# Patient Record
Sex: Male | Born: 1945 | Race: White | Hispanic: No | Marital: Married | Smoking: Former smoker
Health system: Southern US, Community
[De-identification: ages and names within clinical notes are randomized; demographics above are authoritative.]

## PROBLEM LIST (undated history)

## (undated) DIAGNOSIS — E785 Hyperlipidemia, unspecified: Secondary | ICD-10-CM

## (undated) DIAGNOSIS — N3281 Overactive bladder: Secondary | ICD-10-CM

## (undated) DIAGNOSIS — K219 Gastro-esophageal reflux disease without esophagitis: Secondary | ICD-10-CM

## (undated) DIAGNOSIS — Z860100 Personal history of colon polyps, unspecified: Secondary | ICD-10-CM

## (undated) DIAGNOSIS — D72819 Decreased white blood cell count, unspecified: Secondary | ICD-10-CM

## (undated) DIAGNOSIS — C4491 Basal cell carcinoma of skin, unspecified: Secondary | ICD-10-CM

## (undated) DIAGNOSIS — N400 Enlarged prostate without lower urinary tract symptoms: Secondary | ICD-10-CM

## (undated) HISTORY — PX: UPPER GI ENDOSCOPY: SHX6162

## (undated) HISTORY — DX: Personal history of colon polyps, unspecified: Z86.0100

## (undated) HISTORY — DX: Personal history of colonic polyps: Z86.010

## (undated) HISTORY — DX: Basal cell carcinoma of skin, unspecified: C44.91

## (undated) HISTORY — PX: COLONOSCOPY: SHX174

## (undated) HISTORY — DX: Benign prostatic hyperplasia without lower urinary tract symptoms: N40.0

## (undated) HISTORY — DX: Overactive bladder: N32.81

## (undated) HISTORY — DX: Decreased white blood cell count, unspecified: D72.819

## (undated) HISTORY — DX: Gastro-esophageal reflux disease without esophagitis: K21.9

## (undated) HISTORY — PX: WISDOM TOOTH EXTRACTION: SHX21

## (undated) HISTORY — DX: Hyperlipidemia, unspecified: E78.5

---

## 2001-01-23 HISTORY — PX: ROTATOR CUFF REPAIR: SHX139

## 2003-11-26 ENCOUNTER — Ambulatory Visit: Payer: Self-pay | Admitting: Family Medicine

## 2003-12-15 ENCOUNTER — Ambulatory Visit: Payer: Self-pay | Admitting: Family Medicine

## 2004-05-13 ENCOUNTER — Ambulatory Visit: Payer: Self-pay | Admitting: Family Medicine

## 2004-06-24 ENCOUNTER — Ambulatory Visit: Payer: Self-pay | Admitting: Family Medicine

## 2004-08-16 ENCOUNTER — Ambulatory Visit: Payer: Self-pay | Admitting: Gastroenterology

## 2004-09-06 ENCOUNTER — Ambulatory Visit: Payer: Self-pay | Admitting: Gastroenterology

## 2004-11-09 ENCOUNTER — Ambulatory Visit: Payer: Self-pay | Admitting: Family Medicine

## 2005-09-07 ENCOUNTER — Ambulatory Visit: Payer: Self-pay | Admitting: Family Medicine

## 2005-09-13 ENCOUNTER — Ambulatory Visit: Payer: Self-pay | Admitting: Family Medicine

## 2005-09-28 ENCOUNTER — Ambulatory Visit: Payer: Self-pay | Admitting: Family Medicine

## 2005-11-01 ENCOUNTER — Ambulatory Visit: Payer: Self-pay | Admitting: Family Medicine

## 2006-10-22 ENCOUNTER — Encounter: Payer: Self-pay | Admitting: Family Medicine

## 2006-10-22 DIAGNOSIS — N400 Enlarged prostate without lower urinary tract symptoms: Secondary | ICD-10-CM | POA: Insufficient documentation

## 2006-10-23 ENCOUNTER — Ambulatory Visit: Payer: Self-pay | Admitting: Family Medicine

## 2006-10-23 DIAGNOSIS — M545 Low back pain: Secondary | ICD-10-CM

## 2006-11-06 ENCOUNTER — Encounter: Admission: RE | Admit: 2006-11-06 | Discharge: 2006-11-06 | Payer: Self-pay | Admitting: Family Medicine

## 2006-11-16 ENCOUNTER — Ambulatory Visit: Payer: Self-pay | Admitting: Family Medicine

## 2007-03-21 ENCOUNTER — Ambulatory Visit: Payer: Self-pay | Admitting: Family Medicine

## 2007-03-22 LAB — CONVERTED CEMR LAB
ALT: 20 units/L (ref 0–53)
AST: 20 units/L (ref 0–37)
BUN: 14 mg/dL (ref 6–23)
Basophils Relative: 0.4 % (ref 0.0–1.0)
Bilirubin, Direct: 0.1 mg/dL (ref 0.0–0.3)
Calcium: 9.5 mg/dL (ref 8.4–10.5)
Chloride: 105 meq/L (ref 96–112)
Cholesterol: 205 mg/dL (ref 0–200)
Eosinophils Relative: 1.4 % (ref 0.0–5.0)
GFR calc non Af Amer: 80 mL/min
HCT: 43.5 % (ref 39.0–52.0)
Hemoglobin: 14.6 g/dL (ref 13.0–17.0)
Lymphocytes Relative: 33.3 % (ref 12.0–46.0)
MCV: 100.7 fL — ABNORMAL HIGH (ref 78.0–100.0)
Monocytes Relative: 9.1 % (ref 3.0–11.0)
Neutro Abs: 2.4 10*3/uL (ref 1.4–7.7)
Potassium: 4 meq/L (ref 3.5–5.1)
RBC: 4.33 M/uL (ref 4.22–5.81)
Sodium: 140 meq/L (ref 135–145)
Total Bilirubin: 1.1 mg/dL (ref 0.3–1.2)
Total CHOL/HDL Ratio: 3.2
WBC: 4.4 10*3/uL — ABNORMAL LOW (ref 4.5–10.5)

## 2007-08-27 ENCOUNTER — Telehealth: Payer: Self-pay | Admitting: Family Medicine

## 2007-09-09 ENCOUNTER — Ambulatory Visit: Payer: Self-pay | Admitting: Family Medicine

## 2008-02-06 ENCOUNTER — Ambulatory Visit: Payer: Self-pay | Admitting: Family Medicine

## 2008-04-03 ENCOUNTER — Ambulatory Visit: Payer: Self-pay | Admitting: Family Medicine

## 2008-04-03 DIAGNOSIS — R079 Chest pain, unspecified: Secondary | ICD-10-CM

## 2008-04-03 DIAGNOSIS — F4322 Adjustment disorder with anxiety: Secondary | ICD-10-CM

## 2008-04-08 ENCOUNTER — Encounter: Payer: Self-pay | Admitting: Family Medicine

## 2008-04-08 ENCOUNTER — Ambulatory Visit: Payer: Self-pay

## 2008-04-22 ENCOUNTER — Ambulatory Visit: Payer: Self-pay | Admitting: Family Medicine

## 2008-04-22 DIAGNOSIS — R1013 Epigastric pain: Secondary | ICD-10-CM | POA: Insufficient documentation

## 2008-05-22 ENCOUNTER — Telehealth: Payer: Self-pay | Admitting: Family Medicine

## 2008-06-03 ENCOUNTER — Ambulatory Visit: Payer: Self-pay | Admitting: Gastroenterology

## 2008-06-03 ENCOUNTER — Encounter: Payer: Self-pay | Admitting: Gastroenterology

## 2008-10-27 ENCOUNTER — Ambulatory Visit: Payer: Self-pay | Admitting: Family Medicine

## 2008-12-02 ENCOUNTER — Ambulatory Visit: Payer: Self-pay | Admitting: Family Medicine

## 2008-12-02 DIAGNOSIS — E785 Hyperlipidemia, unspecified: Secondary | ICD-10-CM

## 2008-12-02 DIAGNOSIS — K219 Gastro-esophageal reflux disease without esophagitis: Secondary | ICD-10-CM

## 2008-12-04 ENCOUNTER — Encounter: Payer: Self-pay | Admitting: Family Medicine

## 2008-12-04 LAB — CONVERTED CEMR LAB
ALT: 17 units/L (ref 0–53)
AST: 20 units/L (ref 0–37)
Alkaline Phosphatase: 41 units/L (ref 39–117)
CO2: 28 meq/L (ref 19–32)
Chloride: 103 meq/L (ref 96–112)
Eosinophils Relative: 1.1 % (ref 0.0–5.0)
HCT: 39.8 % (ref 39.0–52.0)
HDL: 58 mg/dL (ref >39.00)
Hemoglobin: 13.7 g/dL (ref 13.0–17.0)
Lymphocytes Relative: 30.6 % (ref 12.0–46.0)
Lymphs Abs: 2 10*3/uL (ref 0.7–4.0)
MCHC: 34.5 g/dL (ref 30.0–36.0)
MCV: 102.8 fL — ABNORMAL HIGH (ref 78.0–100.0)
Platelets: 146 10*3/uL — ABNORMAL LOW (ref 150.0–400.0)
Potassium: 4.1 meq/L (ref 3.5–5.1)
RBC: 3.87 M/uL — ABNORMAL LOW (ref 4.22–5.81)
RDW: 11 % — ABNORMAL LOW (ref 11.5–14.6)
Sodium: 140 meq/L (ref 135–145)
Total CHOL/HDL Ratio: 4
Total Protein: 7.3 g/dL (ref 6.0–8.3)

## 2008-12-07 ENCOUNTER — Encounter (INDEPENDENT_AMBULATORY_CARE_PROVIDER_SITE_OTHER): Payer: Self-pay | Admitting: *Deleted

## 2009-02-15 ENCOUNTER — Ambulatory Visit: Payer: Self-pay | Admitting: Family Medicine

## 2009-02-16 LAB — CONVERTED CEMR LAB
Cholesterol: 201 mg/dL — ABNORMAL HIGH (ref 0–200)
Direct LDL: 127.7 mg/dL
HDL: 64.3 mg/dL (ref >39.00)
Triglycerides: 41 mg/dL (ref 0.0–149.0)
VLDL: 8.2 mg/dL (ref 0.0–40.0)

## 2009-02-17 ENCOUNTER — Telehealth: Payer: Self-pay | Admitting: Family Medicine

## 2009-03-15 ENCOUNTER — Ambulatory Visit: Payer: Self-pay | Admitting: Gastroenterology

## 2009-03-22 ENCOUNTER — Telehealth: Payer: Self-pay | Admitting: Family Medicine

## 2009-03-22 ENCOUNTER — Encounter: Payer: Self-pay | Admitting: Family Medicine

## 2009-08-10 ENCOUNTER — Ambulatory Visit: Payer: Self-pay | Admitting: Family Medicine

## 2009-09-21 ENCOUNTER — Encounter (INDEPENDENT_AMBULATORY_CARE_PROVIDER_SITE_OTHER): Payer: Self-pay | Admitting: *Deleted

## 2009-09-21 ENCOUNTER — Ambulatory Visit: Payer: Self-pay | Admitting: Gastroenterology

## 2009-09-21 DIAGNOSIS — Z860100 Personal history of colon polyps, unspecified: Secondary | ICD-10-CM | POA: Insufficient documentation

## 2009-09-21 DIAGNOSIS — R1013 Epigastric pain: Secondary | ICD-10-CM

## 2009-09-21 DIAGNOSIS — K3189 Other diseases of stomach and duodenum: Secondary | ICD-10-CM | POA: Insufficient documentation

## 2009-10-14 ENCOUNTER — Ambulatory Visit: Payer: Self-pay | Admitting: Family Medicine

## 2009-11-03 ENCOUNTER — Ambulatory Visit: Payer: Self-pay | Admitting: Gastroenterology

## 2009-11-05 ENCOUNTER — Encounter: Payer: Self-pay | Admitting: Gastroenterology

## 2010-02-22 NOTE — Assessment & Plan Note (Signed)
Summary: F/U ACID REFLUX/CLE   Vital Signs:  Patient profile:   65 year old male Height:      72.25 inches Weight:      206.50 pounds BMI:     27.91 Temp:     97.8 degrees F oral Pulse rate:   60 / minute Pulse rhythm:   regular BP sitting:   126 / 76  (left arm) Cuff size:   regular  Vitals Entered By: Lewanda Rife LPN (August 10, 2009 9:28 AM) CC: follow-up visit for acid reflux   History of Present Illness: here for f/u of GERD has failed several meds incl omeprazole and nexium and prevacid , and zantac  is now on protonix which we got prior auth for   reflux is not doing well  is still having symptoms - and this is alarming him  last time he saw GI -- suggested he start taking his ppi in the evening (did help a little bit )  follows lifestyle change - does not eat at night or the wrong foods   is belching all night long  no vomiting and only once did he regurgitate  no pain -- almost never (at first he had some chest discomfort)- that has improved  last EGD 5/10   has little stress - but not bad  does exercise and that helps    wt is down 4 lb  good bp  needs refil of alprazolam for travel - uses sometimes for travel   Allergies: 1)  ! * Omeprazole 2)  ! Nexium 3)  ! Prevacid 4)  ! Zantac 5)  Penicillin  Past History:  Past Medical History: Last updated: 12/02/2008 Benign prostatic hypertrophy mild LS deg disk dz and facet hypertrophy mild hyperlipidemia with good HDL  atypical chest pain 06/13/08  GERD  urology  Past Surgical History: Last updated: 06/06/2008 Colonoscopy- polyps (07/2001 ?unknown pathology, SML Colonoscopy- polyps, hemorrhoids, diverticulosis (08/2004, polyps not retrieved, [facility]) MRI LS 10/08- mild deg changes, disk bulging, facet hypertrophy nuclear stress test neg 3/10  5/10 EGD- minor schatzki ring/ gerd  Family History: Last updated: 06/13/08 Father: HTN, died age 28- MI Mother: HTN  Siblings:  GF prostate Ca GM thyroid  ca   Social History: Last updated: 03/15/2009 Marital Status: Married Children: 1 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 CV:  Denies chest pain or discomfort, palpitations, and shortness of breath with exertion. Resp:  Denies cough, shortness of breath, and wheezing. GI:  Complains of abdominal pain, gas, indigestion, and nausea; denies change in bowel habits and vomiting. GU:  Denies urinary frequency. Derm:  Denies poor wound healing and rash. Neuro:  Denies headaches and numbness. Psych:  nl amt of stress for him. Endo:  Denies cold intolerance, excessive thirst, excessive urination, and heat intolerance. Heme:  Denies abnormal bruising and bleeding.  Physical Exam  General:  Well-developed,well-nourished,in no acute distress; alert,appropriate and cooperative throughout examination Head:  normocephalic, atraumatic, and no abnormalities observed.   Eyes:  vision grossly intact, pupils equal, pupils round, and pupils reactive to light.  .con  Mouth:  pharynx pink and moist.   Neck:  supple with full rom and no masses or thyromegally, no JVD or carotid bruit  Lungs:  Normal respiratory effort, chest expands symmetrically. Lungs are clear to auscultation,  no crackles or wheezes. Heart:  Normal rate and regular rhythm. S1 and S2 normal without gallop, murmur, click, rub or other extra sounds. Abdomen:  Bowel sounds positive,abdomen soft and non-tender without masses, organomegaly or hernias noted. no renal bruits  Skin:  Intact without suspicious lesions or rashes no pallor or jaundice Cervical Nodes:  No lymphadenopathy noted Inguinal Nodes:  No significant adenopathy Psych:  normal affect, talkative and pleasant    Impression & Recommendations:  Problem # 1:   GERD (ICD-530.81) Assessment Deteriorated  worsening belching - improved but not symptom free with protonix after failing several ppis and H2s ref to GI for further eval disc quitting coffee as well His updated medication list for this problem includes:    Protonix 40 Mg Tbec (Pantoprazole sodium) .Marland Kitchen... 1 by mouth once daily in pm  Orders: Gastroenterology Referral (GI) Prescription Created Electronically [phone])  Diagnostics Reviewed:  EGD: Location: [facility]   (06/03/2008) Discussed lifestyle modifications, diet, antacids/medications, and preventive measures. Handout provided.   Problem # 2:  CHEST PAIN (ICD-786.50) Assessment: Improved this is resolved on PPI completely- adv to update if this re- occurs  Complete Medication List: 1)  Flomax 0.4 Mg Cp24 (Tamsulosin hcl) .... One by mouth qd 2)  Avodart 0.5 Mg Caps (Dutasteride) .... One by mouth qd 3)  Fish Oil 1200 Mg Caps (Omega-3 fatty acids) .... One by mouth bid 4)  Alprazolam 0.5 Mg Tabs (Alprazolam) .Marland Kitchen.. 1 by mouth up to two times a day as needed airplane flight 5)  Multivitamins Tabs (Multiple vitamin) .... Take 1 tablet by mouth once a day 6)  Glucosamine Chondr 500 Complex Caps (Glucosamine-chondroit-vit c-mn) .Marland Kitchen.. 1 cap by mouth two times a day 7)  Protonix 40 Mg Tbec (Pantoprazole sodium) .Marland Kitchen.. 1 by mouth once daily in pm  Patient Instructions: 1)  we will do referral to GI at check out  2)  continue the protonix for now  3)  continue good diet and exercise (avoid carbonation) 4)  please update me in meantime if symptoms worsen Prescriptions: PROTONIX 40 MG TBEC (PANTOPRAZOLE SODIUM) 1 by mouth once daily in pm  #30 x 5   Entered and Authorized by:   Judith Part MD   Signed by:   Judith Part MD on 08/10/2009   Method used:   Electronically to        [facility]. [address] [phone]* (retail)       [address] [place], [place]  [id number]       Ph: [phone]       Fax:  [phone]   RxID:   [id number] ALPRAZOLAM 0.5 MG TABS (ALPRAZOLAM) 1 by mouth up to two times a day as needed airplane flight  #15 x 0   Entered and Authorized by:   Judith Part MD   Signed by:   Judith Part MD on 08/10/2009   Method used:   Print then Give to Patient   RxID:   [id number]   Current Allergies (reviewed today): ! * OMEPRAZOLE ! NEXIUM ! PREVACID ! ZANTAC PENICILLIN

## 2010-02-22 NOTE — Letter (Signed)
Summary: [facility] Instructions  [facility]  [address] [place], [place] [address]   Phone: [phone]  Fax: [phone]       Darren [name]    01-09-1946    MRN: [id number]        Procedure Day /Date:11/03/09 WED     Arrival Time:10 am     Procedure Time:11 am     Location of Procedure:                    X  [facility] (4th Floor)                        PREPARATION FOR COLONOSCOPY WITH MOVIPREP   Starting 5 days prior to your procedure 10/29/09 do not eat nuts, seeds, popcorn, corn, beans, peas,  salads, or any raw vegetables.  Do not take any fiber supplements (e.g. Metamucil, Citrucel, and Benefiber).  THE DAY BEFORE YOUR PROCEDURE         DATE:11/02/09 DAY: TUE  1.  Drink clear liquids the entire day-NO SOLID FOOD  2.  Do not drink anything colored red or purple.  Avoid juices with pulp.  No orange juice.  3.  Drink at least 64 oz. (8 glasses) of fluid/clear liquids during the day to prevent dehydration and help the prep work efficiently.  CLEAR LIQUIDS INCLUDE: Water Jello Ice Popsicles Tea (sugar ok, no milk/cream) Powdered fruit flavored drinks Coffee (sugar ok, no milk/cream) Gatorade Juice: apple, white grape, white cranberry  Lemonade Clear bullion, consomm, broth Carbonated beverages (any kind) Strained chicken noodle soup Hard Candy                             4.  In the morning, mix first dose of MoviPrep solution:    Empty 1 Pouch A and 1 Pouch B into the disposable container    Add lukewarm drinking water to the top line of the container. Mix to dissolve    Refrigerate (mixed solution should be used within 24 hrs)  5.  Begin drinking the prep at 5:00 p.m. The MoviPrep container is divided by 4 marks.   Every 15 minutes drink the solution down to the next mark (approximately 8 oz) until the full liter is complete.   6.  Follow completed prep with 16 oz of clear liquid of your choice (Nothing red or purple).   Continue to drink clear liquids until bedtime.  7.  Before going to bed, mix second dose of MoviPrep solution:    Empty 1 Pouch A and 1 Pouch B into the disposable container    Add lukewarm drinking water to the top line of the container. Mix to dissolve    Refrigerate  THE DAY OF YOUR PROCEDURE      DATE: 11/03/09  DAY: WED  Beginning at 6 am (5 hours before procedure):         1. Every 15 minutes, drink the solution down to the next mark (approx 8 oz) until the full liter is complete.  2. Follow completed prep with 16 oz. of clear liquid of your choice.    3. You may drink clear liquids until 9 am (2 HOURS BEFORE PROCEDURE).   MEDICATION INSTRUCTIONS  Unless otherwise instructed, you should take regular prescription medications with a small sip of water   as early as possible the morning of your procedure.  OTHER INSTRUCTIONS  You will need a responsible adult at least 65 years of age to accompany you and drive you home.   This person must remain in the waiting room during your procedure.  Wear loose fitting clothing that is easily removed.  Leave jewelry and other valuables at home.  However, you may wish to bring a book to read or  an iPod/MP3 player to listen to music as you wait for your procedure to start.  Remove all body piercing jewelry and leave at home.  Total time from sign-in until discharge is approximately 2-3 hours.  You should go home directly after your procedure and rest.  You can resume normal activities the  day after your procedure.  The day of your procedure you should not:   Drive   Make legal decisions   Operate machinery   Drink alcohol   Return to work  You will receive specific instructions about eating, activities and medications before you leave.    The above instructions have been reviewed and explained to me by   _______________________    I fully understand and can verbalize these instructions  _____________________________ Date _________

## 2010-02-22 NOTE — Assessment & Plan Note (Signed)
Review of gastrointestinal problems: 1.  GERD 2.  nonobstructing Schatzki's ring, EGD 2010 not dilated due to lack of dysphasia    History of Present Illness Visit Type: Follow-up Visit Primary GI MD: Rob Bunting MD Primary Provider: Roxy Manns, MD Requesting Provider: n/a Chief Complaint: acid reflux History of Present Illness:     very pleasant 65 year old man whom I last saw 05/2008, EGD performed see above.  I felt he had nonerosive GERD and recommended that he stay on daily PPI.  He has gone through several different PPIs, still is bothered by belching throughout the day and also getting up at night to urinate he has belches.  The numb pains that were going on previous are gone.  drinks caffeine, alcohol daily.  He will take PPI in AM, 1/2 hour before eating.  He is most bothered, concerned by dyspeptic symptoms in evening.  he has absolutely no dysphasia. His weight has been stable. He has no overt GI bleeding.              Current Medications (verified): 1)  Flomax 0.4 Mg  Cp24 (Tamsulosin Hcl) .... One By Mouth Qd 2)  Avodart 0.5 Mg  Caps (Dutasteride) .... One By Mouth Qd 3)  Fish Oil 1200 Mg  Caps (Omega-3 Fatty Acids) .... One By Mouth Bid 4)  Alprazolam 0.5 Mg Tabs (Alprazolam) .Marland Kitchen.. 1 By Mouth Up To Two Times A Day As Needed Airplane Flight 5)  Multivitamins   Tabs (Multiple Vitamin) .... Take 1 Tablet By Mouth Once A Day 6)  Glucosamine Chondr 500 Complex  Caps (Glucosamine-Chondroit-Vit C-Mn) .Marland Kitchen.. 1 Cap By Mouth Two Times A Day 7)  Protonix 40 Mg Tbec (Pantoprazole Sodium) .Marland Kitchen.. 1 By Mouth Once Daily in Am  Allergies (verified): 1)  ! * Omeprazole 2)  ! 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, drinks 2-3 coffees a day  Vital Signs:  Patient profile:   65 year old male Height:      72.25 inches Weight:      210 pounds BMI:      28.39 BSA:     2.18 Pulse rate:   64 / minute Pulse rhythm:   regular BP sitting:   120 / 76  (left arm) Cuff size:   regular  Vitals Entered By: Ok Anis CMA (March 15, 2009 1:46 PM)  Physical Exam  Additional Exam:  Constitutional: generally well appearing Psychiatric: alert and oriented times 3 Abdomen: soft, non-tender, non-distended, normal bowel sounds    Impression & Recommendations:  Problem # 1:  GERD, GERD related dyspepsia I think some of his belching symptoms may indeed be GERD related. He drinks 4-6 caffeinated or alcoholic beverages a day and I recommended he try to cut back. He has no esophagitis or Barrett's mucosa noted on his EGD last year and so I think we are really simply talking about symptomatic control here. I also recommended he change the timing of his proton pump inhibitor so that he takes it prior to his dinner meal. He will call if he has any further questions or concerns.  Patient Instructions: 1)  You should change the way you are taking your antiacid medicine (protonix) so that you are taking it 20-30 minutes prior to a decent dinner meal as that is the way the pill is designed to work most effectively. 2)  Cutting back on caffeine, alcohol a  bit may help GERD symptoms. 3)  Call Dr. Christella Hartigan as needed. 4)  A copy of this information will be sent to Dr. Milinda Antis. 5)  The medication list was reviewed and reconciled.  All changed / newly prescribed medications were explained.  A complete medication list was provided to the patient / caregiver.

## 2010-02-22 NOTE — Letter (Signed)
Summary: Results Letter  [facility]  [address] [place], [place] [address]   Phone: [phone]  Fax: [phone]        November 05, 2009 MRN: [id number]    Darren [name] [address] [place], [place]  [address]    Dear Mr. GRANIER,   One of the polyps removed during your recent procedure was proven to be adenomatous.  These are pre-cancerous polyps that may have grown into cancers if they had not been removed.  Based on current nationally recognized surveillance guidelines, I recommend that you have a repeat colonoscopy in 5 years.  We will therefore put your information in our reminder system and will contact you in 5 years to schedule a repeat procedure.  Please call if you have any questions or concerns.       Sincerely,  Rachael Fee MD  This letter has been electronically signed by your physician.  Appended Document: Results Letter letter mailed

## 2010-02-22 NOTE — Assessment & Plan Note (Signed)
Review of gastrointestinal problems: 1.  GERD (nonerosive) 2.  nonobstructing Schatzki's ring, EGD 2010 not dilated due to lack of dysphasia 3.  colon polyps removed in 2003 and in 2006 by Victorino Dike, not retrieved (unclear pathology).  Was told to have next colonoscopy in 2012   History of Present Illness Visit Type: Follow-up Visit Primary GI MD: Rob Bunting MD Primary Amyiah Gaba: Roxy Manns, MD Requesting Harlen Danford: n/a Chief Complaint: Acid Reflux History of Present Illness:     very pleasant 65 year old man whom I last saw February 2011.  He stillbelches at night whenever he is up.  he doesn't have pain or signficant discomfort.    he was under the assumption that every dyspeptic symptoms or GERD related symptom he had put him at higher risk for  every once in a while he will drink some bicarb or vinegar...he takes it for a disccomfort, fullness in esophagus.  Doesn't not have true burning.  VERY rarely acid taste in mouch.  Rarely belches during the day.  always belches at night.  he eats dinner 3-4 hours after finishing his dinner meal.  has lost 3 pounds in 7 months.  takes protonix 1/2 hour before dinner meal           Current Medications (verified): 1)  Flomax 0.4 Mg  Cp24 (Tamsulosin Hcl) .... One By Mouth Qd 2)  Avodart 0.5 Mg  Caps (Dutasteride) .... One By Mouth Qd 3)  Fish Oil 1200 Mg  Caps (Omega-3 Fatty Acids) .... One By Mouth Bid 4)  Alprazolam 0.5 Mg Tabs (Alprazolam) .Marland Kitchen.. 1 By Mouth Up To Two Times A Day As Needed Airplane Flight 5)  Multivitamins   Tabs (Multiple Vitamin) .... Take 1 Tablet By Mouth Once A Day 6)  Glucosamine Chondr 500 Complex  Caps (Glucosamine-Chondroit-Vit C-Mn) .Marland Kitchen.. 1 Cap By Mouth Two Times A Day 7)  Protonix 40 Mg Tbec (Pantoprazole Sodium) .Marland Kitchen.. 1 By Mouth Once Daily in Pm  Allergies (verified): 1)  ! * Omeprazole 2)  ! Nexium 3)  ! Prevacid 4)  ! Zantac 5)  Penicillin  Vital Signs:  Patient profile:   65 year old  male Height:      72.25 inches Weight:      207 pounds BMI:     27.98 BSA:     2.17 Pulse rate:   68 / minute Pulse rhythm:   regular BP sitting:   110 / 64  (left arm)  Vitals Entered By: Merri Ray CMA Duncan Dull) (September 21, 2009 3:53 PM)  Physical Exam  Additional Exam:  Constitutional: generally well appearing Psychiatric: alert and oriented times 3 Abdomen: soft, non-tender, non-distended, normal bowel sounds    Impression & Recommendations:  Problem # 1:  dyspepsia, question GERD relation he was under the assumption that every dyspeptic or GERD like symptom that he had would put him at risk for esophageal cancer and that achieving zero symptoms was the goal of therapy. I explained to him that this was not truly the case and that given his endoscopy last year and his lack of any alarm symptoms I think it is highly unlikely that he has anything neoplastic going on in his esophagus. I don't see a need to repeat endoscopy at this point. He in fact is not very bothered by his symptoms except for the worry that they have caused. I recommended he try Gas-X at night for his nightly belching. I've also asked him to try to cut back on his proton  pump inhibitor to see how his symptoms react.  Problem # 2:  colon polyps he has had 2 polyps removed however not retrieved by his previous gastroenterologist. I recommended we repeat his colonoscopy around now, this would be 5 years from his most recent one, and based on the findings on this next colonoscopy I will recommend proper interval based on national polyp surveillance guidelines.  Patient Instructions: 1)  Cut back protonix to every other day.   2)  If symptoms worsen, then go back to once daily protonix. 3)  Consider one gas-ex nightly at bedtime. 4)  You will be scheduled to have a colonoscopy. 5)  The medication list was reviewed and reconciled.  All changed / newly prescribed medications were explained.  A complete medication list  was provided to the patient / caregiver.  Appended Document: Orders Update/movi    Clinical Lists Changes  Problems: Added new problem of PERSONAL HISTORY OF COLONIC POLYPS (ICD-V12.72) Added new problem of DYSPEPSIA&OTHER SPEC DISORDERS FUNCTION STOMACH (ICD-536.8) Medications: Added new medication of MOVIPREP 100 GM  SOLR (PEG-KCL-NACL-NASULF-NA ASC-C) As per prep instructions. - Signed Rx of MOVIPREP 100 GM  SOLR (PEG-KCL-NACL-NASULF-NA ASC-C) As per prep instructions.;  #1 x 0;  Signed;  Entered by: Chales Abrahams CMA (AAMA);  Authorized by: Rachael Fee MD;  Method used: Electronically to [facility]. [facility] [phone]*, [address] [place], [place]  [id number], Ph: [phone], Fax: [phone] Orders: Added new Test order of Colonoscopy (Colon) - Signed    Prescriptions: MOVIPREP 100 GM  SOLR (PEG-KCL-NACL-NASULF-NA ASC-C) As per prep instructions.  #1 x 0   Entered by:   Chales Abrahams CMA (AAMA)   Authorized by:   Rachael Fee MD   Signed by:   Chales Abrahams CMA (AAMA) on 09/21/2009   Method used:   Electronically to        [facility]. [address] [phone]* (retail)       [address] [place], [place]  [id number]       Ph: [phone]       Fax: [phone]   RxID:   [id number]

## 2010-02-22 NOTE — Procedures (Signed)
Summary: Colonoscopy  Patient: Darren [name] Note: All result statuses are Final unless otherwise noted.  Tests: (1) Colonoscopy (COL)   COL Colonoscopy           DONE     [facility]     [address]     [place], [place]  [address]           COLONOSCOPY PROCEDURE REPORT     PATIENT:  [name], Darren  MR#:  [id number]     BIRTHDATE:  23-Oct-1945, 64 yrs. old  GENDER:  male     ENDOSCOPIST:  Rachael Fee, MD     PROCEDURE DATE:  11/03/2009     PROCEDURE:  Colonoscopy with biopsy and snare polypectomy     ASA CLASS:  Class II     INDICATIONS:  previous small polyps removed by Dr. Corinda Gubler, were     not sent to pathology     MEDICATIONS:   Fentanyl 100 mcg IV, Versed 10 mg IV     DESCRIPTION OF PROCEDURE:   After the risks benefits and     alternatives of the procedure were thoroughly explained, informed     consent was obtained.  Digital rectal exam was performed and     revealed no rectal masses.   The LB [id number] endoscope     was introduced through the anus and advanced to the cecum, which     was identified by both the appendix and ileocecal valve, without     limitations.  The quality of the prep was good, using MoviPrep.     The instrument was then slowly withdrawn as the colon was fully     examined.     <<PROCEDUREIMAGES>>     FINDINGS: Two small sessile polyps were found. Both were removed     and both were sent to pathology. These ranged in size from 2mm to     4mm. One was removed with forceps and one was removed with cold     snare. They were located in ascending and descending colon     segments (see image3, image4, and image5).  This was otherwise a     normal examination of the colon (see image1, image2, and image6).     Retroflexed views in the rectum revealed no abnormalities.    The     scope was then withdrawn from the patient and the procedure     completed.     COMPLICATIONS:  None           ENDOSCOPIC IMPRESSION:     1) Two  small polyps, both were removed and both were sent to     pathology     2) Otherwise normal examination           RECOMMENDATIONS:     1) If the polyp(s) removed today are proven to be adenomatous     (pre-cancerous) polyps, you will need a repeat colonoscopy in 5     years. Otherwise you should continue to follow colorectal cancer     screening guidelines for "routine risk" patients with colonoscopy     in 10 years.     2) You will receive a letter within 1-2 weeks with the results     of your biopsy as well as final recommendations. Please call my     office if you have not received a letter after 3 weeks.           ______________________________  Rachael Fee, MD           cc: Roxy Manns, MD           n.     Rosalie Doctor:   Rachael Fee at 11/03/2009 10:55 AM           Rema Fendt, [id number]  Note: An exclamation mark (!) indicates a result that was not dispersed into the flowsheet. Document Creation Date: 11/03/2009 10:55 AM _______________________________________________________________________  (1) Order result status: Final Collection or observation date-time: 11/03/2009 10:50 Requested date-time:  Receipt date-time:  Reported date-time:  Referring Physician:   Ordering Physician: Rob Bunting [phone]) Specimen Source:  Source: Launa Grill Order Number: [id number] Lab site:   Appended Document: Colonoscopy     Procedures Next Due Date:    Colonoscopy: 10/2014

## 2010-02-22 NOTE — Medication Information (Signed)
Summary: Approval for Additional Quantity Pantoprazole/[employer]  Approval for Additional Quantity Pantoprazole/[employer]   Imported By: Lanelle Bal 03/25/2009 11:20:28  _____________________________________________________________________  External Attachment:    Type:   Image     Comment:   External Document

## 2010-02-22 NOTE — Miscellaneous (Signed)
Summary: Controlled Substance Agreement  Controlled Substance Agreement   Imported By: Lanelle Bal 08/17/2009 12:13:23  _____________________________________________________________________  External Attachment:    Type:   Image     Comment:   External Document

## 2010-02-22 NOTE — Progress Notes (Signed)
Summary: Different Rx for Acid reflux  Phone Note Call from Patient   Caller: Patient Call For: Judith Part MD Summary of Call: Patient called and stated that Dr. Milinda Antis has been trying several different medications for his acid reflux.  He says its time to try something different now.  Currently taking Nexium.  Has tried Nexium, Prevacid, and Omperazole and would like to try something else.  Initial call taken by: Linde Gillis CMA Duncan Dull),  February 17, 2009 4:54 PM  Follow-up for Phone Call        lets try protonix px written on EMR for call in  let me know if not effective or side eff  Follow-up by: Judith Part MD,  February 17, 2009 8:01 PM  Additional Follow-up for Phone Call Additional follow up Details #1::        Advised pt. Med called to [facility] [address] Additional Follow-up by: Lowella Petties CMA,  February 18, 2009 9:58 AM    New/Updated Medications: PROTONIX 40 MG TBEC (PANTOPRAZOLE SODIUM) 1 by mouth once daily in am Prescriptions: PROTONIX 40 MG TBEC (PANTOPRAZOLE SODIUM) 1 by mouth once daily in am  #30 x 5   Entered and Authorized by:   Judith Part MD   Signed by:   Judith Part MD on 02/17/2009   Method used:   Telephoned to ...       [facility] [address] [phone]* (retail)       [address] [place], [place]  [id number]       Ph: [phone]       Fax: [phone]   RxID:   [id number]

## 2010-02-22 NOTE — Assessment & Plan Note (Signed)
Summary: FLU SHOT / LFW  Nurse Visit   Allergies: 1)  ! * Omeprazole 2)  ! Nexium 3)  ! Prevacid 4)  ! Zantac 5)  Penicillin  Immunizations Administered:  Influenza Vaccine # 1:    Vaccine Type: Fluvax 3+    Site: left deltoid    Mfr: GlaxoSmithKline    Dose: 0.5 ml    Route: IM    Given by: Mervin Hack CMA (AAMA)    Exp. Date: 07/23/2010    Lot #: [id number]    VIS given: 08/17/09 version given October 14, 2009.  Flu Vaccine Consent Questions:    Do you have a history of severe allergic reactions to this vaccine? no    Any prior history of allergic reactions to egg and/or gelatin? no    Do you have a sensitivity to the preservative Thimersol? no    Do you have a past history of Guillan-Barre Syndrome? no    Do you currently have an acute febrile illness? no    Have you ever had a severe reaction to latex? no    Vaccine information given and explained to patient? yes  Orders Added: 1)  Flu Vaccine 1yrs + [90658] 2)  Admin 1st Vaccine [09811]

## 2010-02-22 NOTE — Progress Notes (Signed)
Summary: Prior Authorization for Pantoprazole 40mg   Phone Note From Pharmacy Call back at ph [phone] fax [phone]   Caller: [facility] [address] [phone]* Call For: Dr. Milinda Antis  Summary of Call: Received fax form from  pharmacy stating that prior authorization is needed for Pantoprazole 40mg .  Called [employer] at [phone] and requested PA forms.  Case # [id number].  Linde Gillis CMA Duncan Dull)  March 22, 2009 10:37 AM   Received PA forms, in your IN box. Initial call taken by: Linde Gillis CMA Duncan Dull),  March 22, 2009 11:44 AM  Follow-up for Phone Call        thanks form done and in nurse in box  Follow-up by: Judith Part MD,  March 22, 2009 1:17 PM  Additional Follow-up for Phone Call Additional follow up Details #1::        Completed form faxed to [phone]  as instructed. Original form is given to [facility] in case needed at later time.Lewanda Rife LPN  March 22, 2009 4:16 PM      Appended Document: Prior Authorization for Pantoprazole 40mg  Received PA Approval for Pantoprazole Sodium.  Approved from 03/01/2009 through 03/22/2010.  Patient and pharmacy notified.

## 2010-02-22 NOTE — Progress Notes (Signed)
Summary: refill on medication   Phone Note Call from Patient Call back at Home Phone [phone]   Caller: Patient Call For: Judith Part MD Summary of Call: Patient called and said that he could not get a refill on his medication without prior auth. He said that before you had discussed changing some of his medication. He did not get pharmacy to send [place] the prior auth yet because he wants to know if you want to change anything. [facility] [address]  Initial call taken by: Melody Comas,  March 22, 2009 10:27 AM  Follow-up for Phone Call        if protonix works for him- it is my next choice so I will go ahead and do the prior auth  let me know if any problems with it  form done and in nurse in box  Follow-up by: Judith Part MD,  March 22, 2009 1:14 PM  Additional Follow-up for Phone Call Additional follow up Details #1::        Completed form faxed to [phone] as instructed. Lowella Bandy has original form if needed later and Patient notified as instructed by telephone. Lewanda Rife LPN  March 22, 2009 5:19 PM

## 2010-03-10 ENCOUNTER — Telehealth: Payer: Self-pay | Admitting: Family Medicine

## 2010-03-16 NOTE — Progress Notes (Signed)
Summary: new vaccine  Phone Note Call from Patient Call back at Home Phone [phone]   Caller: Patient Call For: Judith Part MD Summary of Call: Patient calling said heard that a new pneumonia vaccine is out that protects against a type of pnuemonia that the pnumovax doesnt protect against if this is true patient would like to get this vaccine.Consuello Masse CMA   Initial call taken by: Benny Lennert CMA Duncan Dull),  March 10, 2010 2:08 PM  Follow-up for Phone Call        I don't know what that would be --- please have him try to find out the name of it  Follow-up by: Judith Part MD,  March 10, 2010 3:14 PM  Additional Follow-up for Phone Call Additional follow up Details #1::        Patient's wife notified as instructed by telephone. Lewanda Rife LPN  March 10, 2010 4:59 PM

## 2010-10-26 ENCOUNTER — Ambulatory Visit (INDEPENDENT_AMBULATORY_CARE_PROVIDER_SITE_OTHER): Payer: Medicare Other

## 2010-10-26 DIAGNOSIS — Z23 Encounter for immunization: Secondary | ICD-10-CM

## 2011-05-23 ENCOUNTER — Telehealth: Payer: Self-pay | Admitting: Family Medicine

## 2011-05-23 NOTE — Telephone Encounter (Signed)
Pt is coming in next week for cpe labs and wanted to know if he could add Cholesterol particle size test to those labs ??

## 2011-05-23 NOTE — Telephone Encounter (Signed)
Patient advised as instructed via telephone, he stated that he will call his insurance company to see if they will pay for the test but if they will not pay for the test he will pay for it out of pocket.

## 2011-05-23 NOTE — Telephone Encounter (Signed)
That is called a lipo science profile - and I am fine with it - but need pt to call insurance about coverage of that first -- then let me know and I will add it on  Thanks

## 2011-05-29 ENCOUNTER — Telehealth: Payer: Self-pay | Admitting: Family Medicine

## 2011-05-29 DIAGNOSIS — E785 Hyperlipidemia, unspecified: Secondary | ICD-10-CM

## 2011-05-29 DIAGNOSIS — K219 Gastro-esophageal reflux disease without esophagitis: Secondary | ICD-10-CM

## 2011-05-29 DIAGNOSIS — Z0000 Encounter for general adult medical examination without abnormal findings: Secondary | ICD-10-CM | POA: Insufficient documentation

## 2011-05-29 DIAGNOSIS — Z86010 Personal history of colonic polyps: Secondary | ICD-10-CM

## 2011-05-29 DIAGNOSIS — N400 Enlarged prostate without lower urinary tract symptoms: Secondary | ICD-10-CM

## 2011-05-29 NOTE — Telephone Encounter (Signed)
Message copied by Judy Pimple on Mon May 29, 2011  1:43 PM ------      Message from: Alvina Chou      Created: Thu May 25, 2011 10:48 AM      Regarding: Labs for May 7       Patient is scheduled for CPX labs, please order future labs, Thanks , Camelia Eng

## 2011-05-30 ENCOUNTER — Other Ambulatory Visit (INDEPENDENT_AMBULATORY_CARE_PROVIDER_SITE_OTHER): Payer: Medicare Other

## 2011-05-30 DIAGNOSIS — E7800 Pure hypercholesterolemia, unspecified: Secondary | ICD-10-CM

## 2011-05-30 DIAGNOSIS — Z860100 Personal history of colon polyps, unspecified: Secondary | ICD-10-CM

## 2011-05-30 DIAGNOSIS — N400 Enlarged prostate without lower urinary tract symptoms: Secondary | ICD-10-CM

## 2011-05-30 DIAGNOSIS — K219 Gastro-esophageal reflux disease without esophagitis: Secondary | ICD-10-CM

## 2011-05-30 DIAGNOSIS — E785 Hyperlipidemia, unspecified: Secondary | ICD-10-CM

## 2011-05-30 DIAGNOSIS — E039 Hypothyroidism, unspecified: Secondary | ICD-10-CM

## 2011-05-30 DIAGNOSIS — Z0000 Encounter for general adult medical examination without abnormal findings: Secondary | ICD-10-CM

## 2011-05-30 LAB — CBC WITH DIFFERENTIAL/PLATELET
Basophils Absolute: 0 10*3/uL (ref 0.0–0.1)
HCT: 42.3 % (ref 39.0–52.0)
Hemoglobin: 14.2 g/dL (ref 13.0–17.0)
Lymphocytes Relative: 35.9 % (ref 12.0–46.0)
Lymphs Abs: 1.2 10*3/uL (ref 0.7–4.0)
MCHC: 33.5 g/dL (ref 30.0–36.0)
Neutrophils Relative %: 44.9 % (ref 43.0–77.0)
Platelets: 130 10*3/uL — ABNORMAL LOW (ref 150.0–400.0)
RBC: 4.25 Mil/uL (ref 4.22–5.81)
RDW: 12.7 % (ref 11.5–14.6)
WBC: 3.3 10*3/uL — ABNORMAL LOW (ref 4.5–10.5)

## 2011-05-30 LAB — COMPREHENSIVE METABOLIC PANEL
BUN: 15 mg/dL (ref 6–23)
CO2: 27 mEq/L (ref 19–32)
Calcium: 9.2 mg/dL (ref 8.4–10.5)
Chloride: 107 mEq/L (ref 96–112)
GFR: 97.08 mL/min (ref >60.00)
Glucose, Bld: 97 mg/dL (ref 70–99)

## 2011-05-30 LAB — TSH: TSH: 2.07 u[IU]/mL (ref 0.35–5.50)

## 2011-05-30 LAB — PSA: PSA: 0.25 ng/mL (ref 0.10–4.00)

## 2011-06-01 LAB — NMR LIPOPROFILE WITH LIPIDS
HDL Particle Number: 34.4 umol/L (ref >=30.5)
LDL (calc): 103 mg/dL — ABNORMAL HIGH (ref <100)
LP-IR Score: 20 (ref <=45)
Small LDL Particle Number: 485 nmol/L (ref <=527)
Triglycerides: 67 mg/dL (ref <150)

## 2011-06-12 ENCOUNTER — Encounter: Payer: Self-pay | Admitting: Family Medicine

## 2011-06-12 ENCOUNTER — Ambulatory Visit (INDEPENDENT_AMBULATORY_CARE_PROVIDER_SITE_OTHER): Payer: Medicare Other | Admitting: Family Medicine

## 2011-06-12 ENCOUNTER — Other Ambulatory Visit: Payer: Self-pay | Admitting: *Deleted

## 2011-06-12 VITALS — BP 118/72 | HR 69 | Temp 98.7°F | Ht 72.5 in | Wt 220.8 lb

## 2011-06-12 DIAGNOSIS — N400 Enlarged prostate without lower urinary tract symptoms: Secondary | ICD-10-CM

## 2011-06-12 DIAGNOSIS — Z86010 Personal history of colonic polyps: Secondary | ICD-10-CM

## 2011-06-12 DIAGNOSIS — D72819 Decreased white blood cell count, unspecified: Secondary | ICD-10-CM | POA: Insufficient documentation

## 2011-06-12 DIAGNOSIS — E785 Hyperlipidemia, unspecified: Secondary | ICD-10-CM

## 2011-06-12 MED ORDER — ALPRAZOLAM 0.5 MG PO TABS: 0.5000 mg | ORAL_TABLET | Freq: Two times a day (BID) | ORAL | Status: AC | PRN

## 2011-06-12 NOTE — Assessment & Plan Note (Signed)
utd colonoscopy with 5 year recal- not due yet

## 2011-06-12 NOTE — Assessment & Plan Note (Signed)
Very mild with mildly low platelet Suspect from many colds this season -one recent Re check mid summer

## 2011-06-12 NOTE — Progress Notes (Signed)
Subjective:    Patient ID: Darren [name], male    DOB: 12-18-1945, 66 y.o.   MRN: [id number]  HPI Here for check up of chronic medical conditions and to review health mt list  Is doing well overall  Feels ok     bp good 118/76  Wt is up 13 lb with bmi of 29 Likes to stay below 215 -- ? Reason for the gain , poss age related and also less outdoor work / too much eating  Still goes to the gym   Wbc 3.3 and platelet slt low - will watch this  Has had several viruses - with grandkids around  Feels fine now   .lipids- did lipo science profile Lab Results  Component Value Date   CHOL 201* 02/15/2009   HDL 64.30 02/15/2009   LDLCALC 103* 05/30/2011   LDLDIRECT 127.7 02/15/2009   TRIG 67 05/30/2011   CHOLHDL 3 02/15/2009   he had nl amt of small particles - re assuring  Is eating very low cholesterol as well   Other labs all good   utd imms  psa  Was 0.25 Hx of BPH- does still have a lot of nocturia -- has contemplated surgery - may consider it  Gets urol prostate exams   colonosc 10/11 with polyps- 5 year recal  No longer has any acid reflux issues- happy about that   Patient Active Problem List  Diagnoses  . PURE HYPERCHOLESTEROLEMIA  . HYPERLIPIDEMIA, MILD  . ANXIETY DISORDER, SITUATIONAL, MILD  . GERD  . DYSPEPSIA&OTHER SPEC DISORDERS FUNCTION STOMACH  . BENIGN PROSTATIC HYPERTROPHY  . BACK PAIN, LUMBAR  . CHEST PAIN  . EPIGASTRIC PAIN  . PERSONAL HISTORY OF COLONIC POLYPS  . Routine general medical examination at a health care facility   No past medical history on file. No past surgical history on file. History  Substance Use Topics  . Smoking status: Never Smoker   . Smokeless tobacco: Not on file  . Alcohol Use: Not on file   No family history on file. Allergies  Allergen Reactions  . Esomeprazole Magnesium     REACTION: lower GI side effects  . Lansoprazole     REACTION: not effective  . Omeprazole     REACTION: is not symptom free with that  .  Penicillins     REACTION: allergy as a child  . Ranitidine Hcl     REACTION: not effective   No current outpatient prescriptions on file prior to visit.       Review of Systems    Review of Systems  Constitutional: Negative for fever, appetite change, fatigue and unexpected weight change.  Eyes: Negative for pain and visual disturbance.  Respiratory: Negative for cough and shortness of breath.   Cardiovascular: Negative for cp or palpitations    Gastrointestinal: Negative for nausea, diarrhea and constipation.  Genitourinary: Negative for urgency and frequency.  Skin: Negative for pallor or rash   Neurological: Negative for weakness, light-headedness, numbness and headaches.  Hematological: Negative for adenopathy. Does not bruise/bleed easily.  Psychiatric/Behavioral: Negative for dysphoric mood. The patient is not nervous/anxious.      Objective:   Physical Exam  Constitutional: He appears well-developed and well-nourished. No distress.       overwt and well appearing   HENT:  Head: Normocephalic and atraumatic.  Right Ear: External ear normal.  Left Ear: External ear normal.  Nose: Nose normal.  Mouth/Throat: Oropharynx is clear and moist.  Pt talks very loudly  Denies hearing problems   Eyes: Conjunctivae and EOM are normal. Pupils are equal, round, and reactive to light. No scleral icterus.  Neck: Normal range of motion. Neck supple. No JVD present. Carotid bruit is not present. No thyromegaly present.  Cardiovascular: Normal rate, regular rhythm, normal heart sounds and intact distal pulses.  Exam reveals no gallop.   Pulmonary/Chest: Effort normal and breath sounds normal. No respiratory distress. He has no wheezes.  Abdominal: Soft. Bowel sounds are normal. He exhibits no distension, no abdominal bruit and no mass. There is no tenderness.  Musculoskeletal: Normal range of motion. He exhibits no edema and no tenderness.  Lymphadenopathy:    He has no cervical  adenopathy.  Neurological: He is alert. He has normal reflexes. No cranial nerve deficit. He exhibits normal muscle tone. Coordination normal.  Skin: Skin is warm and dry. No rash noted. No erythema. No pallor.       lentigos diffusely Fair complexion   Psychiatric: He has a normal mood and affect.          Assessment & Plan:

## 2011-06-12 NOTE — Assessment & Plan Note (Signed)
Lipids improved Also good lipo science - less small particles Disc goals for lipids and reasons to control them Rev labs with pt Rev low sat fat diet in detail

## 2011-06-12 NOTE — Patient Instructions (Signed)
Cholesterol is looking good - including lipo science profile -- keep working on healthy diet with low sat fats We will watch your cbc -- schedule non fasting labs for mid summer  Work on weight loss- keep exercising

## 2011-06-12 NOTE — Assessment & Plan Note (Signed)
Pt will continue f/u with urologist- he is considering surgery psa is ok

## 2011-08-21 ENCOUNTER — Other Ambulatory Visit (INDEPENDENT_AMBULATORY_CARE_PROVIDER_SITE_OTHER): Payer: Medicare Other

## 2011-08-21 DIAGNOSIS — D72819 Decreased white blood cell count, unspecified: Secondary | ICD-10-CM

## 2011-08-22 LAB — CBC WITH DIFFERENTIAL/PLATELET
Basophils Relative: 0.5 % (ref 0.0–3.0)
HCT: 40.7 % (ref 39.0–52.0)
Hemoglobin: 13.7 g/dL (ref 13.0–17.0)
MCV: 99.6 fl (ref 78.0–100.0)
Monocytes Relative: 10.5 % (ref 3.0–12.0)
Neutro Abs: 2.7 10*3/uL (ref 1.4–7.7)
Platelets: 150 10*3/uL (ref 150.0–400.0)
WBC: 5 10*3/uL (ref 4.5–10.5)

## 2011-10-23 ENCOUNTER — Ambulatory Visit (INDEPENDENT_AMBULATORY_CARE_PROVIDER_SITE_OTHER): Payer: Medicare Other

## 2011-10-23 DIAGNOSIS — Z23 Encounter for immunization: Secondary | ICD-10-CM

## 2012-04-18 ENCOUNTER — Other Ambulatory Visit: Payer: Self-pay

## 2012-04-18 MED ORDER — TAMSULOSIN HCL 0.4 MG PO CAPS: 0.4000 mg | ORAL_CAPSULE | Freq: Every day | ORAL | Status: DC

## 2012-04-18 MED ORDER — DUTASTERIDE 0.5 MG PO CAPS: 0.5000 mg | ORAL_CAPSULE | Freq: Every day | ORAL | Status: DC

## 2012-04-18 NOTE — Telephone Encounter (Signed)
Pt left v/m pt's urologist has retired; pt has appt with new urologist 05/13/12. Pt has medication at this time but will run out next week;pt request refill on avodart and flomax to [facility] [address] Please advise.

## 2012-04-18 NOTE — Telephone Encounter (Signed)
Please refil a month while he is waiting for new urol appt thanks

## 2012-04-18 NOTE — Telephone Encounter (Signed)
meds refilled for 1 month and pt notified

## 2012-08-23 HISTORY — PX: PROSTATE SURGERY: SHX751

## 2012-09-24 ENCOUNTER — Emergency Department: Payer: Self-pay | Admitting: Emergency Medicine

## 2012-09-24 LAB — URINALYSIS, COMPLETE
Bacteria: NONE SEEN
Glucose,UR: NEGATIVE mg/dL (ref 0–75)
Ketone: NEGATIVE
Ph: 6 (ref 4.5–8.0)
RBC,UR: 6806 /HPF (ref 0–5)
Specific Gravity: 1.016 (ref 1.003–1.030)

## 2012-10-09 ENCOUNTER — Ambulatory Visit (INDEPENDENT_AMBULATORY_CARE_PROVIDER_SITE_OTHER): Payer: Medicare Other

## 2012-10-09 DIAGNOSIS — Z23 Encounter for immunization: Secondary | ICD-10-CM

## 2012-11-05 ENCOUNTER — Telehealth: Payer: Self-pay

## 2012-11-05 NOTE — Telephone Encounter (Signed)
That's fine if he wants it and we have it yet - he will need to check with his insurance

## 2012-11-05 NOTE — Telephone Encounter (Signed)
Pt left v/m wanting to know if Dr Milinda Antis recommends the new pneumonia vaccine. If so pt would like to schedule appt.Please advise.

## 2012-11-07 NOTE — Telephone Encounter (Signed)
Spoke with [place] and the providers agreed we are not giving the new vaccine however pt is due for his regular pneumonia vaccine  Called pt to advise him and no answer and no voicemail (kept ringing)

## 2012-12-12 NOTE — Telephone Encounter (Signed)
Called pt again and no answer and no voicemail (kept ringing) 

## 2012-12-17 NOTE — Telephone Encounter (Signed)
Pt left v/m requesting cb at [phone] about new pneumonia vaccine.

## 2012-12-24 NOTE — Telephone Encounter (Signed)
Left voicemail letting pt know we are not giving the new vaccine out yet so he can either get the regular vaccine or wait until we decide to give the new one out

## 2013-02-12 ENCOUNTER — Encounter: Payer: Self-pay | Admitting: Gastroenterology

## 2013-03-10 ENCOUNTER — Ambulatory Visit (INDEPENDENT_AMBULATORY_CARE_PROVIDER_SITE_OTHER): Payer: Medicare Other | Admitting: Gastroenterology

## 2013-03-10 ENCOUNTER — Encounter: Payer: Self-pay | Admitting: Gastroenterology

## 2013-03-10 VITALS — BP 120/80 | HR 68 | Ht 72.0 in | Wt 218.5 lb

## 2013-03-10 DIAGNOSIS — K219 Gastro-esophageal reflux disease without esophagitis: Secondary | ICD-10-CM

## 2013-03-10 NOTE — Patient Instructions (Signed)
Start taking one OTC pepcid or zantac (generic equivalent) at bedtime every night. Continue omeprazole (best taken 20-30 min before BF meal daily). Call Dr. Ardis Hughs' office in 6 weeks to report on your response to these changes. If you don't notice a significant improvement, then would consider EGD.

## 2013-03-10 NOTE — Progress Notes (Signed)
Review of pertinent gastrointestinal problems: 1. GERD (nonerosive)  2. nonobstructing Schatzki's ring, EGD 2010 not dilated due to lack of dysphasia; done for dyspepsia 3. colon polyps removed in 2003 and in 2006 by Lyla Son, not retrieved (unclear pathology). Was told to have next colonoscopy in 2012;  Repeat colonoscopy Ardis Hughs) 10/2009; two small polyps, one was TA, recall recommended 5 year interval.   HPI: This is a   very pleasant 68 year old man whom I last saw 4 or 5 years ago. He is a very good historian.  Was doing well for a long time.  Would take mylanta periodically.  This past spring, reflux symptoms (belching, indigestion disomfort, he bought some OTC prilosec for 14 days, felt much better.  Felt better fow quite a long time, several months. Then in August, he had TURP, was under a lot stress. He took another 14 day course of prilosec with again, very good results for several months.    This past xmas, went to [place] visiting family.  Two of his 3 grandchildren had 'stomach flu.'  On x-mas day he himself had acute vomiting/diarrheal illness.  Since then he has not been back to normal.  He was very sensative to temperatures of food, generally uncomfortatble.  HE is concerned about this.  Bought some pepto and this helped.  GERD symptoms recurred, restarted prilosec.  Has gained a  Bit of weight overall.  No dysphagia.   His throat feels funny.  Still takes pepto periodically.  Currently on third 14 day course of prilosec.  Drinks 3 cups of coffee daily.  Will drink 2.5 drinks per day.  Non smoker.  PEriodic chocolate.  GERD symptoms clearly worse at night, bedtime.    Review of systems: Pertinent positive and negative review of systems were noted in the above HPI section. Complete review of systems was performed and was otherwise normal.    Past Medical History  Diagnosis Date  . History of colon polyps   . Other and unspecified hyperlipidemia   . Hypertrophy of  prostate without urinary obstruction and other lower urinary tract symptoms (LUTS)   . Leukocytopenia, unspecified   . GERD (gastroesophageal reflux disease)   . Basal cell carcinoma     Past Surgical History  Procedure Laterality Date  . Prostate surgery  08/2012    TURP  . Rotator cuff repair Right 2003    Current Outpatient Prescriptions  Medication Sig Dispense Refill  . dutasteride (AVODART) 0.5 MG capsule Take 1 capsule (0.5 mg total) by mouth daily.  30 capsule  0  . Glucosamine-Chondroitin (COSAMIN DS PO) Take 1 tablet by mouth 2 (two) times daily.      Marland Kitchen omeprazole (PRILOSEC OTC) 20 MG tablet Take 20 mg by mouth daily.       No current facility-administered medications for this visit.    Allergies as of 03/10/2013 - Review Complete 03/10/2013  Allergen Reaction Noted  . Esomeprazole magnesium  12/02/2008  . Lansoprazole  08/10/2009  . Omeprazole  12/02/2008  . Penicillins  10/22/2006  . Ranitidine hcl  08/10/2009    Family History  Problem Relation Age of Onset  . Heart attack Father   . Breast cancer Mother   . Thyroid cancer Maternal Grandmother     History   Social History  . Marital Status: Married    Spouse Name: N/A    Number of Children: 1  . Years of Education: N/A   Occupational History  . retired Automotive engineer  Social History Main Topics  . Smoking status: Former Smoker    Quit date: 01/24/1968  . Smokeless tobacco: Never Used  . Alcohol Use: Yes     Comment: 2 1/2 per day  . Drug Use: No  . Sexual Activity: Not on file   Other Topics Concern  . Not on file   Social History Narrative  . No narrative on file       Physical Exam: BP 120/80  Pulse 68  Ht 6' (1.829 m)  Wt 218 lb 8 oz (99.111 kg)  BMI 29.63 kg/m2 Constitutional: generally well-appearing Psychiatric: alert and oriented x3 Eyes: extraocular movements intact Mouth: oral pharynx moist, no lesions Neck: supple no lymphadenopathy Cardiovascular: heart regular  rate and rhythm Lungs: clear to auscultation bilaterally Abdomen: soft, nontender, nondistended, no obvious ascites, no peritoneal signs, normal bowel sounds Extremities: no lower extremity edema bilaterally Skin: no lesions on visible extremities    Assessment and plan: 68 y.o. male with  chronic GERD without alarm symptoms  He drinks 2-3 alcohol beverages daily and this can contribute to GERD symptoms. I don't think he has an abuse issue at all with alcohol but cutting back might help his indigestion, reflux symptoms and he understands that. He is going to start taking proton pump inhibitor on a daily basis shortly before breakfast and he will also begin an H2 blocker at bedtime every night. He will call to report on his symptoms in 6 weeks.

## 2013-04-21 ENCOUNTER — Telehealth: Payer: Self-pay | Admitting: Gastroenterology

## 2013-04-21 NOTE — Telephone Encounter (Signed)
Great, he should continue those meds.  rov with me in 3-4 months.

## 2013-04-21 NOTE — Telephone Encounter (Signed)
Pt aware and will call back to set up appt

## 2013-04-21 NOTE — Telephone Encounter (Signed)
FYI

## 2013-05-05 ENCOUNTER — Telehealth: Payer: Self-pay | Admitting: Family Medicine

## 2013-05-05 DIAGNOSIS — Z0000 Encounter for general adult medical examination without abnormal findings: Secondary | ICD-10-CM | POA: Insufficient documentation

## 2013-05-05 DIAGNOSIS — E785 Hyperlipidemia, unspecified: Secondary | ICD-10-CM

## 2013-05-05 DIAGNOSIS — N400 Enlarged prostate without lower urinary tract symptoms: Secondary | ICD-10-CM

## 2013-05-05 NOTE — Telephone Encounter (Signed)
Message copied by Abner Greenspan on Mon May 05, 2013  9:40 PM ------      Message from: Ellamae Sia      Created: Thu Apr 24, 2013 11:28 AM      Regarding: Lab orders for Tuesday, 4.14.15       Patient is scheduled for CPX labs, please order future labs, Thanks , Terri       ------

## 2013-05-06 ENCOUNTER — Other Ambulatory Visit (INDEPENDENT_AMBULATORY_CARE_PROVIDER_SITE_OTHER): Payer: Medicare Other

## 2013-05-06 DIAGNOSIS — Z0000 Encounter for general adult medical examination without abnormal findings: Secondary | ICD-10-CM

## 2013-05-06 DIAGNOSIS — N400 Enlarged prostate without lower urinary tract symptoms: Secondary | ICD-10-CM

## 2013-05-06 DIAGNOSIS — E785 Hyperlipidemia, unspecified: Secondary | ICD-10-CM

## 2013-05-06 LAB — PSA: PSA: 0.13 ng/mL (ref 0.10–4.00)

## 2013-05-06 LAB — LIPID PANEL
CHOL/HDL RATIO: 4
Cholesterol: 215 mg/dL — ABNORMAL HIGH (ref 0–200)
HDL: 55.5 mg/dL (ref >39.00)
LDL Cholesterol: 144 mg/dL — ABNORMAL HIGH (ref 0–99)
TRIGLYCERIDES: 80 mg/dL (ref 0.0–149.0)
VLDL: 16 mg/dL (ref 0.0–40.0)

## 2013-05-06 LAB — CBC WITH DIFFERENTIAL/PLATELET
BASOS PCT: 0.7 % (ref 0.0–3.0)
Basophils Absolute: 0 10*3/uL (ref 0.0–0.1)
EOS ABS: 0.1 10*3/uL (ref 0.0–0.7)
EOS PCT: 2.3 % (ref 0.0–5.0)
HCT: 42.6 % (ref 39.0–52.0)
Hemoglobin: 14.6 g/dL (ref 13.0–17.0)
LYMPHS PCT: 28.3 % (ref 12.0–46.0)
Lymphs Abs: 1.3 10*3/uL (ref 0.7–4.0)
MCHC: 34.3 g/dL (ref 30.0–36.0)
MCV: 99.1 fl (ref 78.0–100.0)
MONO ABS: 0.5 10*3/uL (ref 0.1–1.0)
MONOS PCT: 11.4 % (ref 3.0–12.0)
NEUTROS ABS: 2.6 10*3/uL (ref 1.4–7.7)
NEUTROS PCT: 57.3 % (ref 43.0–77.0)
Platelets: 154 10*3/uL (ref 150.0–400.0)
RBC: 4.3 Mil/uL (ref 4.22–5.81)
RDW: 12.5 % (ref 11.5–14.6)
WBC: 4.6 10*3/uL (ref 4.5–10.5)

## 2013-05-06 LAB — COMPREHENSIVE METABOLIC PANEL
ALK PHOS: 40 U/L (ref 39–117)
ALT: 20 U/L (ref 0–53)
AST: 17 U/L (ref 0–37)
Albumin: 4 g/dL (ref 3.5–5.2)
BUN: 15 mg/dL (ref 6–23)
CO2: 28 mEq/L (ref 19–32)
CREATININE: 0.9 mg/dL (ref 0.4–1.5)
Calcium: 9.5 mg/dL (ref 8.4–10.5)
Chloride: 104 mEq/L (ref 96–112)
GFR: 93.93 mL/min (ref >60.00)
Glucose, Bld: 97 mg/dL (ref 70–99)
POTASSIUM: 4 meq/L (ref 3.5–5.1)
Sodium: 138 mEq/L (ref 135–145)
TOTAL PROTEIN: 6.4 g/dL (ref 6.0–8.3)
Total Bilirubin: 1 mg/dL (ref 0.3–1.2)

## 2013-05-06 LAB — TSH: TSH: 3.1 u[IU]/mL (ref 0.35–5.50)

## 2013-05-13 ENCOUNTER — Ambulatory Visit (INDEPENDENT_AMBULATORY_CARE_PROVIDER_SITE_OTHER): Payer: Medicare Other | Admitting: Family Medicine

## 2013-05-13 ENCOUNTER — Encounter: Payer: Self-pay | Admitting: Family Medicine

## 2013-05-13 VITALS — BP 116/72 | HR 72 | Temp 97.9°F | Ht 72.25 in | Wt 217.5 lb

## 2013-05-13 DIAGNOSIS — N400 Enlarged prostate without lower urinary tract symptoms: Secondary | ICD-10-CM

## 2013-05-13 DIAGNOSIS — E785 Hyperlipidemia, unspecified: Secondary | ICD-10-CM

## 2013-05-13 DIAGNOSIS — Z23 Encounter for immunization: Secondary | ICD-10-CM

## 2013-05-13 DIAGNOSIS — Z0000 Encounter for general adult medical examination without abnormal findings: Secondary | ICD-10-CM

## 2013-05-13 MED ORDER — ALPRAZOLAM 0.5 MG PO TABS: ORAL_TABLET | ORAL | Status: DC

## 2013-05-13 NOTE — Progress Notes (Signed)
Subjective:    Patient ID: Darren [name], male    DOB: Aug 30, 1945, 68 y.o.   MRN: [id number]  HPI I have personally reviewed the Medicare Annual Wellness questionnaire and have noted 1. The patient's medical and social history 2. Their use of alcohol, tobacco or illicit drugs 3. Their current medications and supplements 4. The patient's functional ability including ADL's, fall risks, home safety risks and hearing or visual             impairment. 5. Diet and physical activities 6. Evidence for depression or mood disorders  The patients weight, height, BMI have been recorded in the chart and visual acuity is per eye clinic.  I have made referrals, counseling and provided education to the patient based review of the above and I have provided the pt with a written personalized care plan for preventive services.  Feeling good in general Still having urinary symptoms - had a TURP and that did not help at all (had it at [facility] Is taking care of himself and also exercising  Has appt with new urologist - (old one retired)- will have appt upcoming about urinary frequency   See scanned forms.  Routine anticipatory guidance given to patient.  See health maintenance. Colon cancer screening - colonoscopy 10/11 (has had polyps)   Flu vaccine fall  Tetanus vaccine 11/10 Pneumovax 8/09 - due now -will get now  Zoster vaccine 9/07 Prostate cancer screening - psa is fine / seeing urologist  Advance directive - he has a living will set up  Cognitive function addressed- see scanned forms- and if abnormal then additional documentation follows. - no major problems /not worried   PMH and SH reviewed  Meds, vitals, and allergies reviewed.   ROS: See HPI.  Otherwise negative.      Had TURP prostate surg for BPH He is on avodart and also vesicare  Lab Results  Component Value Date   PSA 0.13 05/06/2013   PSA 0.25 05/30/2011   PSA 0.38 09/07/2005    Hyperlipidemia  Lab Results  Component  Value Date   CHOL 215* 05/06/2013   CHOL 201* 02/15/2009   CHOL 211* 12/02/2008   Lab Results  Component Value Date   HDL 55.50 05/06/2013   HDL 64.30 02/15/2009   HDL 58.00 12/02/2008   Lab Results  Component Value Date   LDLCALC 144* 05/06/2013   LDLCALC 103* 05/30/2011   Lab Results  Component Value Date   TRIG 80.0 05/06/2013   TRIG 67 05/30/2011   TRIG 41.0 02/15/2009   Lab Results  Component Value Date   CHOLHDL 4 05/06/2013   CHOLHDL 3 02/15/2009   CHOLHDL 4 12/02/2008   Lab Results  Component Value Date   LDLDIRECT 127.7 02/15/2009   LDLDIRECT 144.2 12/02/2008   LDLDIRECT 134.0 03/21/2007    He exercised less due to his surgery- which may account for the decreased HDL Does eat fish regularly  LDL is up - he does eat shellfish occasionally and some red meat  He is going to work on diet - he may be eating a little less carefully  Had a favorable lipo science profile    Patient Active Problem List   Diagnosis Date Noted  . Encounter for Medicare annual wellness exam 05/05/2013  . Leukopenia 06/12/2011  . Routine general medical examination at a health care facility 05/29/2011  . DYSPEPSIA&OTHER [place] DISORDERS FUNCTION STOMACH 09/21/2009  . PERSONAL HISTORY OF COLONIC POLYPS 09/21/2009  . [place], MILD 12/02/2008  .  GERD 12/02/2008  . ANXIETY DISORDER, SITUATIONAL, MILD 04/03/2008  . CHEST PAIN 04/03/2008  . BACK PAIN, LUMBAR 10/23/2006  . BENIGN PROSTATIC HYPERTROPHY 10/22/2006   Past Medical History  Diagnosis Date  . History of colon polyps   . Other and unspecified hyperlipidemia   . Hypertrophy of prostate without urinary obstruction and other lower urinary tract symptoms (LUTS)   . Leukocytopenia, unspecified   . GERD (gastroesophageal reflux disease)   . Basal cell carcinoma    Past Surgical History  Procedure Laterality Date  . Prostate surgery  08/2012    TURP  . Rotator cuff repair Right 2003   History  Substance Use Topics  . Smoking  status: Former Smoker    Quit date: 01/24/1968  . Smokeless tobacco: Never Used  . Alcohol Use: Yes     Comment: 2 1/2 per day   Family History  Problem Relation Age of Onset  . Heart attack Father   . Breast cancer Mother   . Thyroid cancer Maternal Grandmother    Allergies  Allergen Reactions  . Esomeprazole Magnesium     REACTION: lower GI side effects  . Lansoprazole     REACTION: not effective  . Omeprazole     REACTION: is not symptom free with that  . Penicillins     REACTION: allergy as a child  . Ranitidine Hcl     REACTION: not effective   Current Outpatient Prescriptions on File Prior to Visit  Medication Sig Dispense Refill  . dutasteride (AVODART) 0.5 MG capsule Take 1 capsule (0.5 mg total) by mouth daily.  30 capsule  0  . Glucosamine-Chondroitin (COSAMIN DS PO) Take 1 tablet by mouth 2 (two) times daily.      Marland Kitchen omeprazole (PRILOSEC OTC) 20 MG tablet Take 20 mg by mouth daily.       No current facility-administered medications on file prior to visit.    Review of Systems Review of Systems  Constitutional: Negative for fever, appetite change, fatigue and unexpected weight change.  Eyes: Negative for pain and visual disturbance.  Respiratory: Negative for cough and shortness of breath.   Cardiovascular: Negative for cp or palpitations    Gastrointestinal: Negative for nausea, diarrhea and constipation.  Genitourinary: pos  for urgency and frequency. (baseline) Skin: Negative for pallor or rash   Neurological: Negative for weakness, light-headedness, numbness and headaches.  Hematological: Negative for adenopathy. Does not bruise/bleed easily.  Psychiatric/Behavioral: Negative for dysphoric mood. The patient is not nervous/anxious.         Objective:   Physical Exam  Constitutional: He appears well-developed and well-nourished. No distress.  HENT:  Head: Normocephalic and atraumatic.  Right Ear: External ear normal.  Left Ear: External ear normal.    Nose: Nose normal.  Mouth/Throat: Oropharynx is clear and moist.  Eyes: Conjunctivae and EOM are normal. Pupils are equal, round, and reactive to light. Right eye exhibits no discharge. Left eye exhibits no discharge. No scleral icterus.  Neck: Normal range of motion. Neck supple. No JVD present. Carotid bruit is not present. No thyromegaly present.  Cardiovascular: Normal rate, regular rhythm, normal heart sounds and intact distal pulses.  Exam reveals no gallop.   Pulmonary/Chest: Effort normal and breath sounds normal. No respiratory distress. He has no wheezes. He exhibits no tenderness.  Abdominal: Soft. Bowel sounds are normal. He exhibits no distension, no abdominal bruit and no mass. There is no tenderness.  Musculoskeletal: He exhibits no edema and no tenderness.  Lymphadenopathy:  He has no cervical adenopathy.  Neurological: He is alert. He has normal reflexes. No cranial nerve deficit. He exhibits normal muscle tone. Coordination normal.  Skin: Skin is warm and dry. No rash noted. No erythema. No pallor.  Psychiatric: He has a normal mood and affect.          Assessment & Plan:

## 2013-05-13 NOTE — Progress Notes (Signed)
Pre visit review using our clinic review tool, if applicable. No additional management support is needed unless otherwise documented below in the visit note. 

## 2013-05-13 NOTE — Patient Instructions (Signed)
Pneumonia vaccine today  Watch your diet for cholesterol (Avoid red meat/ fried foods/ egg yolks/ fatty breakfast meats/ butter, cheese and high fat dairy/ and shellfish  ) Keep exercising and take care of yourself     Fat and Cholesterol Control Diet Fat and cholesterol levels in your blood and organs are influenced by your diet. High levels of fat and cholesterol may lead to diseases of the heart, small and large blood vessels, gallbladder, liver, and pancreas. CONTROLLING FAT AND CHOLESTEROL WITH DIET Although exercise and lifestyle factors are important, your diet is key. That is because certain foods are known to raise cholesterol and others to lower it. The goal is to balance foods for their effect on cholesterol and more importantly, to replace saturated and trans fat with other types of fat, such as monounsaturated fat, polyunsaturated fat, and omega-3 fatty acids. On average, a person should consume no more than 15 to 17 g of saturated fat daily. Saturated and trans fats are considered "bad" fats, and they will raise LDL cholesterol. Saturated fats are primarily found in animal products such as meats, butter, and cream. However, that does not mean you need to give up all your favorite foods. Today, there are good tasting, low-fat, low-cholesterol substitutes for most of the things you like to eat. Choose low-fat or nonfat alternatives. Choose round or loin cuts of red meat. These types of cuts are lowest in fat and cholesterol. Chicken (without the skin), fish, veal, and ground [place] breast are great choices. Eliminate fatty meats, such as hot dogs and salami. Even shellfish have little or no saturated fat. Have a 3 oz (85 g) portion when you eat lean meat, poultry, or fish. Trans fats are also called "partially hydrogenated oils." They are oils that have been scientifically manipulated so that they are solid at room temperature resulting in a longer shelf life and improved taste and texture of  foods in which they are added. Trans fats are found in stick margarine, some tub margarines, cookies, crackers, and baked goods.  When baking and cooking, oils are a great substitute for butter. The monounsaturated oils are especially beneficial since it is believed they lower LDL and raise HDL. The oils you should avoid entirely are saturated tropical oils, such as coconut and palm.  Remember to eat a lot from food groups that are naturally free of saturated and trans fat, including fish, fruit, vegetables, beans, grains (barley, rice, couscous, bulgur wheat), and pasta (without cream sauces).  IDENTIFYING FOODS THAT LOWER FAT AND CHOLESTEROL  Soluble fiber may lower your cholesterol. This type of fiber is found in fruits such as apples, vegetables such as broccoli, potatoes, and carrots, legumes such as beans, peas, and lentils, and grains such as barley. Foods fortified with plant sterols (phytosterol) may also lower cholesterol. You should eat at least 2 g per day of these foods for a cholesterol lowering effect.  Read package labels to identify low-saturated fats, trans fat free, and low-fat foods at the supermarket. Select cheeses that have only 2 to 3 g saturated fat per ounce. Use a heart-healthy tub margarine that is free of trans fats or partially hydrogenated oil. When buying baked goods (cookies, crackers), avoid partially hydrogenated oils. Breads and muffins should be made from whole grains (whole-wheat or whole oat flour, instead of "flour" or "enriched flour"). Buy non-creamy canned soups with reduced salt and no added fats.  FOOD PREPARATION TECHNIQUES  Never deep-fry. If you must fry, either stir-fry, which uses  very little fat, or use non-stick cooking sprays. When possible, broil, bake, or roast meats, and steam vegetables. Instead of putting butter or margarine on vegetables, use lemon and herbs, applesauce, and cinnamon (for squash and sweet potatoes). Use nonfat yogurt, salsa, and  low-fat dressings for salads.  LOW-SATURATED FAT / LOW-FAT FOOD SUBSTITUTES Meats / Saturated Fat (g)  Avoid: Steak, marbled (3 oz/85 g) / 11 g  Choose: Steak, lean (3 oz/85 g) / 4 g  Avoid: Hamburger (3 oz/85 g) / 7 g  Choose: Hamburger, lean (3 oz/85 g) / 5 g  Avoid: Ham (3 oz/85 g) / 6 g  Choose: Ham, lean cut (3 oz/85 g) / 2.4 g  Avoid: Chicken, with skin, dark meat (3 oz/85 g) / 4 g  Choose: Chicken, skin removed, dark meat (3 oz/85 g) / 2 g  Avoid: Chicken, with skin, light meat (3 oz/85 g) / 2.5 g  Choose: Chicken, skin removed, light meat (3 oz/85 g) / 1 g Dairy / Saturated Fat (g)  Avoid: Whole milk (1 cup) / 5 g  Choose: Low-fat milk, 2% (1 cup) / 3 g  Choose: Low-fat milk, 1% (1 cup) / 1.5 g  Choose: Skim milk (1 cup) / 0.3 g  Avoid: Hard cheese (1 oz/28 g) / 6 g  Choose: Skim milk cheese (1 oz/28 g) / 2 to 3 g  Avoid: Cottage cheese, 4% fat (1 cup) / 6.5 g  Choose: Low-fat cottage cheese, 1% fat (1 cup) / 1.5 g  Avoid: Ice cream (1 cup) / 9 g  Choose: Sherbet (1 cup) / 2.5 g  Choose: Nonfat frozen yogurt (1 cup) / 0.3 g  Choose: Frozen fruit bar / trace  Avoid: Whipped cream (1 tbs) / 3.5 g  Choose: Nondairy whipped topping (1 tbs) / 1 g Condiments / Saturated Fat (g)  Avoid: Mayonnaise (1 tbs) / 2 g  Choose: Low-fat mayonnaise (1 tbs) / 1 g  Avoid: Butter (1 tbs) / 7 g  Choose: Extra light margarine (1 tbs) / 1 g  Avoid: Coconut oil (1 tbs) / 11.8 g  Choose: Olive oil (1 tbs) / 1.8 g  Choose: Corn oil (1 tbs) / 1.7 g  Choose: Safflower oil (1 tbs) / 1.2 g  Choose: Sunflower oil (1 tbs) / 1.4 g  Choose: Soybean oil (1 tbs) / 2.4 g  Choose: Canola oil (1 tbs) / 1 g Document Released: 01/09/2005 Document Revised: 05/06/2012 Document Reviewed: 06/30/2010 Exit[employer] Patient Information 2014 [place], [place].

## 2013-05-14 NOTE — Assessment & Plan Note (Signed)
Disc goals for lipids and reasons to control them Rev labs with pt Rev low sat fat diet in detail  This is up  Rev diet changes to make Will follow  Rev past favorable liposcience profile

## 2013-05-14 NOTE — Assessment & Plan Note (Signed)
Pt had TURP without much symptom relief Continues on avodart and vesicare with urol f/u soon  Lab Results  Component Value Date   PSA 0.13 05/06/2013   PSA 0.25 05/30/2011   PSA 0.38 09/07/2005

## 2013-05-14 NOTE — Assessment & Plan Note (Signed)
Reviewed health habits including diet and exercise and skin cancer prevention Reviewed appropriate screening tests for age  Also reviewed health mt list, fam hx and immunization status , as well as social and family history   See HPI Lab rev Pneumovax today

## 2013-08-18 ENCOUNTER — Ambulatory Visit: Payer: Medicare Other | Admitting: Gastroenterology

## 2013-09-23 ENCOUNTER — Ambulatory Visit (INDEPENDENT_AMBULATORY_CARE_PROVIDER_SITE_OTHER): Payer: Medicare Other | Admitting: Gastroenterology

## 2013-09-23 ENCOUNTER — Encounter: Payer: Self-pay | Admitting: Gastroenterology

## 2013-09-23 VITALS — BP 110/68 | HR 80 | Ht 72.0 in | Wt 212.0 lb

## 2013-09-23 DIAGNOSIS — K219 Gastro-esophageal reflux disease without esophagitis: Secondary | ICD-10-CM

## 2013-09-23 NOTE — Progress Notes (Signed)
Review of pertinent gastrointestinal problems:  1. GERD (nonerosive): 08/2013 PPI in AM, H2 blocker at bedtime and symptoms under very good control 2. nonobstructing Schatzki's ring, EGD 2010 not dilated due to lack of dysphasia; done for dyspepsia  3. colon polyps removed in 2003 and in 2006 by Lyla Son, not retrieved (unclear pathology). Was told to have next colonoscopy in 2012; Repeat colonoscopy Ardis Hughs) 10/2009; two small polyps, one was TA, recall recommended 5 year interval.   HPI: This is a  very pleasant 68 year old man whom I last saw several months ago.  Taking famotidine at bedtime.  Takes omeprazole in AM before breakfast.  He is much better on this regimen.  Since starting prostate medicine VESIcare, he has noticed significant constipation. He increase his dietary fiber, started taking prunes periodically, start eating yogurt periodically, started taking a daily probiotic.   Past Medical History  Diagnosis Date  . History of colon polyps   . Other and unspecified hyperlipidemia   . Hypertrophy of prostate without urinary obstruction and other lower urinary tract symptoms (LUTS)   . Leukocytopenia, unspecified   . GERD (gastroesophageal reflux disease)   . Basal cell carcinoma     Past Surgical History  Procedure Laterality Date  . Prostate surgery  08/2012    TURP  . Rotator cuff repair Right 2003    Current Outpatient Prescriptions  Medication Sig Dispense Refill  . ALPRAZolam (XANAX) 0.5 MG tablet Take 1 by mouth up to twice daily for airplane flight  15 tablet  0  . Glucosamine-Chondroitin (COSAMIN DS PO) Take 1 tablet by mouth 2 (two) times daily.      Marland Kitchen omeprazole (PRILOSEC) 40 MG capsule Take 40 mg by mouth daily.      . VESICARE 5 MG tablet Take 1 tablet by mouth daily.       No current facility-administered medications for this visit.    Allergies as of 09/23/2013 - Review Complete 09/23/2013  Allergen Reaction Noted  . Penicillins  10/22/2006     Family History  Problem Relation Age of Onset  . Heart attack Father   . Breast cancer Mother   . Thyroid cancer Maternal Grandmother     History   Social History  . Marital Status: Married    Spouse Name: N/A    Number of Children: 1  . Years of Education: N/A   Occupational History  . retired college professor    Social History Main Topics  . Smoking status: Former Smoker    Quit date: 01/24/1968  . Smokeless tobacco: Never Used  . Alcohol Use: Yes     Comment: 2 1/2 per day  . Drug Use: No  . Sexual Activity: Not on file   Other Topics Concern  . Not on file   Social History Narrative  . No narrative on file      Physical Exam: Ht 6' (1.829 m)  Wt 212 lb (96.163 kg)  BMI 28.75 kg/m2 Constitutional: generally well-appearing Psychiatric: alert and oriented x3 Abdomen: soft, nontender, nondistended, no obvious ascites, no peritoneal signs, normal bowel sounds     Assessment and plan: 68 y.o. male with GERD, no alarm symptoms, medication related constipation  He will continue proton pump inhibitor once daily and H2 blocker at night. He is going to try cutting out the probiotic and the older to see if his bowels are still well controlled on dietary fiber and periodic prunes. We discussed repeat colonoscopy for colon polyp surveillance in October 2016.

## 2013-09-23 NOTE — Patient Instructions (Addendum)
Continue omeprazole in AM before breakfast. Continue famotidine at bedtime. You next surveillance colonoscopy for polyps is 10/2014.

## 2013-10-29 ENCOUNTER — Ambulatory Visit (INDEPENDENT_AMBULATORY_CARE_PROVIDER_SITE_OTHER): Payer: Medicare Other

## 2013-10-29 DIAGNOSIS — Z23 Encounter for immunization: Secondary | ICD-10-CM

## 2013-11-06 ENCOUNTER — Encounter: Payer: Self-pay | Admitting: Gastroenterology

## 2014-01-28 ENCOUNTER — Telehealth: Payer: Self-pay | Admitting: Gastroenterology

## 2014-01-28 NOTE — Telephone Encounter (Signed)
Forwarded to Dr. Jacobs for review. °

## 2014-01-29 NOTE — Telephone Encounter (Signed)
I don't recall asking him to call me with sore throat and cannot find notes pertaining to that in his OV with me, if it is from GERD then he can try doubling his omeprazole to twice daily. (shortly before BF meal and dinner meal).    If sore throat continues, then he should call his PCP

## 2014-01-29 NOTE — Telephone Encounter (Signed)
Pt.notified

## 2014-08-10 ENCOUNTER — Telehealth: Payer: Self-pay | Admitting: Family Medicine

## 2014-08-10 DIAGNOSIS — N400 Enlarged prostate without lower urinary tract symptoms: Secondary | ICD-10-CM

## 2014-08-10 DIAGNOSIS — E785 Hyperlipidemia, unspecified: Secondary | ICD-10-CM

## 2014-08-10 DIAGNOSIS — Z0000 Encounter for general adult medical examination without abnormal findings: Secondary | ICD-10-CM

## 2014-08-10 NOTE — Telephone Encounter (Signed)
-----   Message from Ellamae Sia sent at 08/05/2014 11:28 AM EDT ----- Regarding: Lab orders for Tuesday, 7.19.16 Patient is scheduled for CPX labs, please order future labs, Thanks , Karna Christmas

## 2014-08-11 ENCOUNTER — Other Ambulatory Visit (INDEPENDENT_AMBULATORY_CARE_PROVIDER_SITE_OTHER): Payer: Medicare Other

## 2014-08-11 DIAGNOSIS — E785 Hyperlipidemia, unspecified: Secondary | ICD-10-CM

## 2014-08-11 DIAGNOSIS — Z0000 Encounter for general adult medical examination without abnormal findings: Secondary | ICD-10-CM

## 2014-08-11 DIAGNOSIS — N400 Enlarged prostate without lower urinary tract symptoms: Secondary | ICD-10-CM | POA: Diagnosis not present

## 2014-08-11 LAB — COMPREHENSIVE METABOLIC PANEL
ALT: 20 U/L (ref 0–53)
AST: 17 U/L (ref 0–37)
Albumin: 4.2 g/dL (ref 3.5–5.2)
Alkaline Phosphatase: 41 U/L (ref 39–117)
BUN: 18 mg/dL (ref 6–23)
CALCIUM: 9.4 mg/dL (ref 8.4–10.5)
CHLORIDE: 103 meq/L (ref 96–112)
CO2: 29 meq/L (ref 19–32)
Creatinine, Ser: 0.89 mg/dL (ref 0.40–1.50)
GFR: 89.95 mL/min (ref >60.00)
Glucose, Bld: 85 mg/dL (ref 70–99)
POTASSIUM: 3.9 meq/L (ref 3.5–5.1)
Sodium: 139 mEq/L (ref 135–145)
TOTAL PROTEIN: 6.3 g/dL (ref 6.0–8.3)
Total Bilirubin: 0.9 mg/dL (ref 0.2–1.2)

## 2014-08-11 LAB — CBC WITH DIFFERENTIAL/PLATELET
Basophils Absolute: 0 10*3/uL (ref 0.0–0.1)
Basophils Relative: 0.6 % (ref 0.0–3.0)
Eosinophils Absolute: 0.1 10*3/uL (ref 0.0–0.7)
Eosinophils Relative: 2.3 % (ref 0.0–5.0)
HEMATOCRIT: 43.3 % (ref 39.0–52.0)
HEMOGLOBIN: 14.6 g/dL (ref 13.0–17.0)
LYMPHS ABS: 1.4 10*3/uL (ref 0.7–4.0)
LYMPHS PCT: 32.3 % (ref 12.0–46.0)
MCHC: 33.8 g/dL (ref 30.0–36.0)
MCV: 98.5 fl (ref 78.0–100.0)
MONO ABS: 0.5 10*3/uL (ref 0.1–1.0)
Monocytes Relative: 12.8 % — ABNORMAL HIGH (ref 3.0–12.0)
Neutro Abs: 2.2 10*3/uL (ref 1.4–7.7)
Neutrophils Relative %: 52 % (ref 43.0–77.0)
Platelets: 155 10*3/uL (ref 150.0–400.0)
RBC: 4.4 Mil/uL (ref 4.22–5.81)
RDW: 12.8 % (ref 11.5–15.5)
WBC: 4.2 10*3/uL (ref 4.0–10.5)

## 2014-08-11 LAB — LIPID PANEL
CHOLESTEROL: 235 mg/dL — AB (ref 0–200)
HDL: 67.5 mg/dL (ref >39.00)
LDL Cholesterol: 152 mg/dL — ABNORMAL HIGH (ref 0–99)
NonHDL: 167.5
Total CHOL/HDL Ratio: 3
Triglycerides: 78 mg/dL (ref 0.0–149.0)
VLDL: 15.6 mg/dL (ref 0.0–40.0)

## 2014-08-11 LAB — PSA: PSA: 1.99 ng/mL (ref 0.10–4.00)

## 2014-08-11 LAB — TSH: TSH: 2.87 u[IU]/mL (ref 0.35–4.50)

## 2014-08-18 ENCOUNTER — Ambulatory Visit (INDEPENDENT_AMBULATORY_CARE_PROVIDER_SITE_OTHER): Payer: Medicare Other | Admitting: Family Medicine

## 2014-08-18 ENCOUNTER — Encounter: Payer: Self-pay | Admitting: Family Medicine

## 2014-08-18 VITALS — BP 122/78 | HR 63 | Temp 97.6°F | Ht 72.0 in | Wt 220.8 lb

## 2014-08-18 DIAGNOSIS — E785 Hyperlipidemia, unspecified: Secondary | ICD-10-CM | POA: Diagnosis not present

## 2014-08-18 DIAGNOSIS — Z0000 Encounter for general adult medical examination without abnormal findings: Secondary | ICD-10-CM | POA: Diagnosis not present

## 2014-08-18 DIAGNOSIS — N400 Enlarged prostate without lower urinary tract symptoms: Secondary | ICD-10-CM | POA: Diagnosis not present

## 2014-08-18 MED ORDER — ALPRAZOLAM 0.5 MG PO TABS: ORAL_TABLET | ORAL | Status: DC

## 2014-08-18 NOTE — Progress Notes (Signed)
Subjective:    Patient ID: Darren [name], male    DOB: 04/05/1945, 69 y.o.   MRN: [id number]  HPI Here for annual medicare wellness visit as well as chronic/acute medical problems as well as annual preventative exam   I have personally reviewed the Medicare Annual Wellness questionnaire and have noted 1. The patient's medical and social history 2. Their use of alcohol, tobacco or illicit drugs 3. Their current medications and supplements 4. The patient's functional ability including ADL's, fall risks, home safety risks and hearing or visual             impairment. 5. Diet and physical activities 6. Evidence for depression or mood disorders  The patients weight, height, BMI have been recorded in the chart and visual acuity is per eye clinic.  I have made referrals, counseling and provided education to the patient based review of the above and I have provided the pt with a written personalized care plan for preventive services. Reviewed and updated provider list, see scanned forms.  Is feeling very good  Overall having a good summer    See scanned forms.  Routine anticipatory guidance given to patient.  See health maintenance. Colon cancer screening 10/11 - polyps , 5 year recall - upcoming this fall  Hep C screen - not high risk / declines  Flu vaccine-10/15  Tetanus vaccine 11/10 Pneumovax 4/15 , needs prevnar after 4/16  Zoster vaccine 9/07 Prostate cancer screening  Sees urology -has had prostate surgery and it did not help much/ now bladder issues - myrbetriq (symptoms have been about he same)- the 50 mg helps more  Sees Dr Alinda Money  Lab Results  Component Value Date   PSA 1.99 08/11/2014   PSA 0.13 05/06/2013   PSA 0.25 05/30/2011   last urol visit was in March -  If psa was fine    Advance directive- thinks he has a living will and POA  Cognitive function addressed- see scanned forms- and if abnormal then additional documentation follows.  No major memory  problems /a little slower perhaps   Hearing - does have to ask his wife to repeat things - but does not notice otherwise , not a big problem  Hearing loss is mild    PMH and SH reviewed  Meds, vitals, and allergies reviewed.   ROS: See HPI.  Otherwise negative.     BPH hx  Lab Results  Component Value Date   PSA 1.99 08/11/2014   PSA 0.13 05/06/2013   PSA 0.25 05/30/2011     Hyperlipidemia Lab Results  Component Value Date   CHOL 235* 08/11/2014   CHOL 215* 05/06/2013   CHOL 176 05/30/2011   Lab Results  Component Value Date   HDL 67.50 08/11/2014   HDL 55.50 05/06/2013   HDL 60 05/30/2011   Lab Results  Component Value Date   LDLCALC 152* 08/11/2014   LDLCALC 144* 05/06/2013   LDLCALC 103* 05/30/2011   Lab Results  Component Value Date   TRIG 78.0 08/11/2014   TRIG 80.0 05/06/2013   TRIG 67 05/30/2011   Lab Results  Component Value Date   CHOLHDL 3 08/11/2014   CHOLHDL 4 05/06/2013   CHOLHDL 3 02/15/2009   Lab Results  Component Value Date   LDLDIRECT 127.7 02/15/2009   LDLDIRECT 144.2 12/02/2008   LDLDIRECT 134.0 03/21/2007   exercises and eats fish- HDL is up  LDL went up also - took a cruise and ate poorly during that  Has had lipo science   Wt is up 8 lb also  Back to eating healthy now     Chemistry      Component Value Date/Time   NA 139 08/11/2014 0747   K 3.9 08/11/2014 0747   CL 103 08/11/2014 0747   CO2 29 08/11/2014 0747   BUN 18 08/11/2014 0747   CREATININE 0.89 08/11/2014 0747      Component Value Date/Time   CALCIUM 9.4 08/11/2014 0747   ALKPHOS 41 08/11/2014 0747   AST 17 08/11/2014 0747   ALT 20 08/11/2014 0747   BILITOT 0.9 08/11/2014 0747      Lab Results  Component Value Date   TSH 2.87 08/11/2014    Lab Results  Component Value Date   WBC 4.2 08/11/2014   HGB 14.6 08/11/2014   HCT 43.3 08/11/2014   MCV 98.5 08/11/2014   PLT 155.0 08/11/2014     Patient Active Problem List   Diagnosis Date Noted  .  Routine general medical examination at a health care facility 08/10/2014  . Leukopenia 06/12/2011  . DYSPEPSIA&OTHER Gravity DISORDERS FUNCTION STOMACH 09/21/2009  . PERSONAL HISTORY OF COLONIC POLYPS 09/21/2009  . Hyperlipidemia 12/02/2008  . GERD 12/02/2008  . ANXIETY DISORDER, SITUATIONAL, MILD 04/03/2008  . BPH (benign prostatic hyperplasia) 10/22/2006   Past Medical History  Diagnosis Date  . History of colon polyps   . Other and unspecified hyperlipidemia   . Hypertrophy of prostate without urinary obstruction and other lower urinary tract symptoms (LUTS)   . Leukocytopenia, unspecified   . GERD (gastroesophageal reflux disease)   . Basal cell carcinoma   . Overactive bladder    Past Surgical History  Procedure Laterality Date  . Prostate surgery  08/2012    TURP  . Rotator cuff repair Right 2003   History  Substance Use Topics  . Smoking status: Former Smoker    Quit date: 01/24/1968  . Smokeless tobacco: Never Used  . Alcohol Use: 0.0 oz/week    0 Standard drinks or equivalent per week     Comment: 2 1/2 per day   Family History  Problem Relation Age of Onset  . Heart attack Father   . Breast cancer Mother   . Thyroid cancer Maternal Grandmother    Allergies  Allergen Reactions  . Penicillins     REACTION: allergy as a child   Current Outpatient Prescriptions on File Prior to Visit  Medication Sig Dispense Refill  . ALPRAZolam (XANAX) 0.5 MG tablet Take 1 by mouth up to twice daily for airplane flight 15 tablet 0  . famotidine (PEPCID) 20 MG tablet Take 20 mg by mouth at bedtime.    Marland Kitchen omeprazole (PRILOSEC OTC) 20 MG tablet Take 20 mg by mouth every morning.      No current facility-administered medications on file prior to visit.      Review of Systems Review of Systems  Constitutional: Negative for fever, appetite change, fatigue and unexpected weight change.  Eyes: Negative for pain and visual disturbance.  Respiratory: Negative for cough and shortness  of breath.   Cardiovascular: Negative for cp or palpitations    Gastrointestinal: Negative for nausea, diarrhea and constipation.  Genitourinary: Negative for urgency and frequency.  Skin: Negative for pallor or rash   Neurological: Negative for weakness, light-headedness, numbness and headaches.  Hematological: Negative for adenopathy. Does not bruise/bleed easily.  Psychiatric/Behavioral: Negative for dysphoric mood. The patient is not nervous/anxious.         Objective:  Physical Exam  Constitutional: He is oriented to person, place, and time. He appears well-developed and well-nourished. No distress.  overwt and well appearing   HENT:  Head: Normocephalic and atraumatic.  Right Ear: External ear normal.  Left Ear: External ear normal.  Nose: Nose normal.  Mouth/Throat: Oropharynx is clear and moist.  Pt talks very loudly  Eyes: Conjunctivae and EOM are normal. Pupils are equal, round, and reactive to light. Right eye exhibits no discharge. Left eye exhibits no discharge. No scleral icterus.  Neck: Normal range of motion. Neck supple. No JVD present. Carotid bruit is not present. No thyromegaly present.  Cardiovascular: Normal rate, regular rhythm, normal heart sounds and intact distal pulses.  Exam reveals no gallop.   Pulmonary/Chest: Effort normal and breath sounds normal. No respiratory distress. He has no wheezes. He has no rales.  Abdominal: Soft. Bowel sounds are normal. He exhibits no distension, no abdominal bruit and no mass. There is no tenderness.  Musculoskeletal: Normal range of motion. He exhibits no edema or tenderness.  Lymphadenopathy:    He has no cervical adenopathy.  Neurological: He is alert and oriented to person, place, and time. He has normal reflexes. No cranial nerve deficit. He exhibits normal muscle tone. Coordination normal.  Skin: Skin is warm and dry. No rash noted. No erythema. No pallor.  Fair complexion  Psychiatric: He has a normal mood and  affect.          Assessment & Plan:   Problem List Items Addressed This Visit    BPH (benign prostatic hyperplasia) - Primary    Symptoms are stable currently s/p prostate surgery and myberteq  psa is up slightly  Lab Results  Component Value Date   PSA 1.99 08/11/2014   PSA 0.13 05/06/2013   PSA 0.25 05/30/2011   will forward this to his urologist       Encounter for Medicare annual wellness exam    Reviewed health habits including diet and exercise and skin cancer prevention Reviewed appropriate screening tests for age  Also reviewed health mt list, fam hx and immunization status , as well as social and family history   See HPI Labs rev Enc low sat fat diet  Sent psa results to urologist  He will be due for prevnar vaccine after April       Hyperlipidemia    Disc goals for lipids and reasons to control them Rev labs with pt Rev low sat fat diet in detail Pt did have favorable lipo science profile in the past  Better HDL        Relevant Medications   CIALIS 5 MG tablet   Routine general medical examination at a health care facility    Reviewed health habits including diet and exercise and skin cancer prevention Reviewed appropriate screening tests for age  Also reviewed health mt list, fam hx and immunization status , as well as social and family history   See HPI Labs rev Enc low sat fat diet  Sent psa results to urologist  He will be due for prevnar vaccine after April

## 2014-08-18 NOTE — Progress Notes (Signed)
Pre visit review using our clinic review tool, if applicable. No additional management support is needed unless otherwise documented below in the visit note. 

## 2014-08-18 NOTE — Patient Instructions (Signed)
Your PSA has increased  I will forward that to your urologist  Take care of yourself  Eat a low cholesterol diet (Avoid red meat/ fried foods/ egg yolks/ fatty breakfast meats/ butter, cheese and high fat dairy/ and shellfish)

## 2014-08-19 NOTE — Assessment & Plan Note (Signed)
Disc goals for lipids and reasons to control them Rev labs with pt Rev low sat fat diet in detail Pt did have favorable lipo science profile in the past  Better HDL

## 2014-08-19 NOTE — Assessment & Plan Note (Signed)
Reviewed health habits including diet and exercise and skin cancer prevention Reviewed appropriate screening tests for age  Also reviewed health mt list, fam hx and immunization status , as well as social and family history   See HPI Labs rev Enc low sat fat diet  Sent psa results to urologist  He will be due for prevnar vaccine after April

## 2014-08-19 NOTE — Assessment & Plan Note (Signed)
Symptoms are stable currently s/p prostate surgery and myberteq  psa is up slightly  Lab Results  Component Value Date   PSA 1.99 08/11/2014   PSA 0.13 05/06/2013   PSA 0.25 05/30/2011   will forward this to his urologist

## 2014-10-13 ENCOUNTER — Ambulatory Visit (INDEPENDENT_AMBULATORY_CARE_PROVIDER_SITE_OTHER): Payer: Medicare Other

## 2014-10-13 DIAGNOSIS — Z23 Encounter for immunization: Secondary | ICD-10-CM

## 2014-12-15 ENCOUNTER — Ambulatory Visit: Payer: Medicare Other | Admitting: Gastroenterology

## 2014-12-15 ENCOUNTER — Encounter: Payer: Self-pay | Admitting: Gastroenterology

## 2014-12-15 ENCOUNTER — Ambulatory Visit (INDEPENDENT_AMBULATORY_CARE_PROVIDER_SITE_OTHER): Payer: Medicare Other | Admitting: Gastroenterology

## 2014-12-15 VITALS — BP 124/62 | HR 80 | Ht 71.85 in | Wt 219.2 lb

## 2014-12-15 DIAGNOSIS — Z86010 Personal history of colonic polyps: Secondary | ICD-10-CM

## 2014-12-15 DIAGNOSIS — K219 Gastro-esophageal reflux disease without esophagitis: Secondary | ICD-10-CM

## 2014-12-15 MED ORDER — NA SULFATE-K SULFATE-MG SULF 17.5-3.13-1.6 GM/177ML PO SOLN: 1.0000 | Freq: Once | ORAL | Status: DC

## 2014-12-15 NOTE — Progress Notes (Signed)
Review of pertinent gastrointestinal problems:  1. GERD (nonerosive): 08/2013 PPI in AM, H2 blocker at bedtime and symptoms under very good control 2. nonobstructing Schatzki's ring, EGD 2010 not dilated due to lack of dysphasia; done for dyspepsia  3. colon polyps removed in 2003 and in 2006 by Lyla Son, not retrieved (unclear pathology). Was told to have next colonoscopy in 2012; Repeat colonoscopy Ardis Hughs) 10/2009; two small polyps, one was TA, recall recommended 5 year interval.   HPI: This is a  very pleasant 69 year old man whom I last saw about a year and a half ago.  Chief complaint is GERD, personal history of colon polyps  Rarely will he need two PPI in a day.    Nighttime is the worst for him.  He wakens to urinate and will take a tums usually. If he does not he will often have some AM pyrosis.  Still on ppi in AM before BF, in PM pepcid at bedtime.  Under fair control on the above regimen.  He tried pepcid bid and off PPI and this was not good enough.  Overall weight up 4-5 pounds.  No dysphagia at tall.   Past Medical History  Diagnosis Date  . History of colon polyps   . Other and unspecified hyperlipidemia   . Hypertrophy of prostate without urinary obstruction and other lower urinary tract symptoms (LUTS)   . Leukocytopenia, unspecified   . GERD (gastroesophageal reflux disease)   . Basal cell carcinoma   . Overactive bladder     Past Surgical History  Procedure Laterality Date  . Prostate surgery  08/2012    TURP  . Rotator cuff repair Right 2003    Current Outpatient Prescriptions  Medication Sig Dispense Refill  . ALPRAZolam (XANAX) 0.5 MG tablet Take 1 by mouth up to twice daily for airplane flight 15 tablet 0  . CIALIS 5 MG tablet Take 1 tablet by mouth daily.  1  . famotidine (PEPCID) 20 MG tablet Take 20 mg by mouth at bedtime.    Marland Kitchen MYRBETRIQ 50 MG TB24 tablet Take 50 mg by mouth daily.  0  . omeprazole (PRILOSEC OTC) 20 MG tablet Take 20 mg  by mouth every morning.      No current facility-administered medications for this visit.    Allergies as of 12/15/2014 - Review Complete 12/15/2014  Allergen Reaction Noted  . Penicillins  10/22/2006    Family History  Problem Relation Age of Onset  . Heart attack Father   . Breast cancer Mother   . Thyroid cancer Maternal Grandmother     Social History   Social History  . Marital Status: Married    Spouse Name: N/A  . Number of Children: 1  . Years of Education: N/A   Occupational History  . retired college professor    Social History Main Topics  . Smoking status: Former Smoker    Quit date: 01/24/1968  . Smokeless tobacco: Never Used  . Alcohol Use: 0.0 oz/week    0 Standard drinks or equivalent per week     Comment: 2 1/2 per day  . Drug Use: No  . Sexual Activity: Not on file   Other Topics Concern  . Not on file   Social History Narrative     Physical Exam: BP 124/62 mmHg  Pulse 80  Ht 5' 11.85" (1.825 m)  Wt 219 lb 4 oz (99.451 kg)  BMI 29.86 kg/m2 Constitutional: generally well-appearing Psychiatric: alert and oriented x3 Abdomen: soft, nontender,  nondistended, no obvious ascites, no peritoneal signs, normal bowel sounds   Assessment and plan: 69 y.o. male with GERD without alarm symptoms, personal history of colon polyps  He brought up some concerns about risks of chronic proton pump inhibitor use. We discussed these. We agreed to increase his bedtime H2 blocker rather than adding proton pump inhibitor dose. He will therefore be on omeprazole once every morning shortly before breakfast and bedtime famotidine at 40 mg dose. He has had previous history of colon polyps and we will proceed with colonoscopy at his soonest convenience for polyp surveillance.   Owens Loffler, MD [facility] 12/15/2014, 1:54 PM

## 2014-12-15 NOTE — Patient Instructions (Signed)
Double you bedtime pepcid. You will be set up for a colonoscopy for polyp surveillance.

## 2014-12-29 ENCOUNTER — Encounter: Payer: Self-pay | Admitting: Gastroenterology

## 2014-12-30 ENCOUNTER — Encounter: Payer: Self-pay | Admitting: Gastroenterology

## 2015-02-17 ENCOUNTER — Ambulatory Visit (AMBULATORY_SURGERY_CENTER): Payer: Self-pay | Admitting: *Deleted

## 2015-02-17 VITALS — Ht 72.0 in | Wt 224.0 lb

## 2015-02-17 DIAGNOSIS — Z86010 Personal history of colonic polyps: Secondary | ICD-10-CM

## 2015-02-17 NOTE — Progress Notes (Signed)
Patient denies any allergies to eggs or soy. Patient denies any problems with anesthesia/sedation. Patient denies any oxygen use at home and does not take any diet/weight loss medications.  

## 2015-03-02 ENCOUNTER — Ambulatory Visit (AMBULATORY_SURGERY_CENTER): Payer: Medicare Other | Admitting: Gastroenterology

## 2015-03-02 ENCOUNTER — Encounter: Payer: Self-pay | Admitting: Gastroenterology

## 2015-03-02 VITALS — BP 112/59 | HR 55 | Temp 96.4°F | Resp 20 | Ht 72.0 in | Wt 224.0 lb

## 2015-03-02 DIAGNOSIS — Z86010 Personal history of colonic polyps: Secondary | ICD-10-CM | POA: Diagnosis not present

## 2015-03-02 LAB — HM COLONOSCOPY

## 2015-03-02 MED ORDER — SODIUM CHLORIDE 0.9 % IV SOLN: 500.0000 mL | INTRAVENOUS | Status: DC

## 2015-03-02 NOTE — Progress Notes (Signed)
Report to PACU, RN, vss, BBS= Clear.  

## 2015-03-02 NOTE — Patient Instructions (Signed)
YOU HAD AN ENDOSCOPIC PROCEDURE TODAY AT THE  ENDOSCOPY CENTER:   Refer to the procedure report that was given to you for any specific questions about what was found during the examination.  If the procedure report does not answer your questions, please call your gastroenterologist to clarify.  If you requested that your care partner not be given the details of your procedure findings, then the procedure report has been included in a sealed envelope for you to review at your convenience later.  YOU SHOULD EXPECT: Some feelings of bloating in the abdomen. Passage of more gas than usual.  Walking can help get rid of the air that was put into your GI tract during the procedure and reduce the bloating. If you had a lower endoscopy (such as a colonoscopy or flexible sigmoidoscopy) you may notice spotting of blood in your stool or on the toilet paper. If you underwent a bowel prep for your procedure, you may not have a normal bowel movement for a few days.  Please Note:  You might notice some irritation and congestion in your nose or some drainage.  This is from the oxygen used during your procedure.  There is no need for concern and it should clear up in a day or so.  SYMPTOMS TO REPORT IMMEDIATELY:   Following lower endoscopy (colonoscopy or flexible sigmoidoscopy):  Excessive amounts of blood in the stool  Significant tenderness or worsening of abdominal pains  Swelling of the abdomen that is new, acute  Fever of 100F or higher    For urgent or emergent issues, a gastroenterologist can be reached at any hour by calling [phone].   DIET: Your first meal following the procedure should be a small meal and then it is ok to progress to your normal diet. Heavy or fried foods are harder to digest and may make you feel nauseous or bloated.  Likewise, meals heavy in dairy and vegetables can increase bloating.  Drink plenty of fluids but you should avoid alcoholic beverages for 24  hours.  ACTIVITY:  You should plan to take it easy for the rest of today and you should NOT DRIVE or use heavy machinery until tomorrow (because of the sedation medicines used during the test).    FOLLOW UP: Our staff will call the number listed on your records the next business day following your procedure to check on you and address any questions or concerns that you may have regarding the information given to you following your procedure. If we do not reach you, we will leave a message.  However, if you are feeling well and you are not experiencing any problems, there is no need to return our call.  We will assume that you have returned to your regular daily activities without incident.  If any biopsies were taken you will be contacted by phone or by letter within the next 1-3 weeks.  Please call us at [phone] if you have not heard about the biopsies in 3 weeks.    SIGNATURES/CONFIDENTIALITY: You and/or your care partner have signed paperwork which will be entered into your electronic medical record.  These signatures attest to the fact that that the information above on your After Visit Summary has been reviewed and is understood.  Full responsibility of the confidentiality of this discharge information lies with you and/or your care-partner.   Resume medications. Information given on diverticulosis and high fiber diet. 

## 2015-03-02 NOTE — Op Note (Signed)
[facility]  [address] [place], [id number]   COLONOSCOPY PROCEDURE REPORT  PATIENT: Darren, [name]  MR#: [id number] BIRTHDATE: 1946/01/14 , 70  yrs. old GENDER: male ENDOSCOPIST: Milus Banister, MD PROCEDURE DATE:  03/02/2015 PROCEDURE:   Colonoscopy, surveillance First Screening Colonoscopy - Avg.  risk and is 50 yrs.  old or older - No.  Prior Negative Screening - Now for repeat screening. N/A  History of Adenoma - Now for follow-up colonoscopy & has been > or = to 3 yrs.  Yes hx of adenoma.  Has been 3 or more years since last colonoscopy.  Recommend repeat exam, <10 yrs? No ASA CLASS:   Class II INDICATIONS:Surveillance due to prior colonic neoplasia and colon polyps removed in 2003 and in 2006 by Lyla Son, not retrieved (unclear pathology).  Was told to have next colonoscopy in 2012; Repeat colonoscopy Ardis Hughs) 10/2009; two small polyps, one was TA, recall recommended 5 year interval.. MEDICATIONS: Monitored anesthesia care and Propofol 230  DESCRIPTION OF PROCEDURE:   After the risks benefits and alternatives of the procedure were thoroughly explained, informed consent was obtained.  The digital rectal exam revealed no abnormalities of the rectum.   The LB [id number]  endoscope was introduced through the anus and advanced to the cecum, which was identified by both the appendix and ileocecal valve. No adverse events experienced.   The quality of the prep was excellent.  The instrument was then slowly withdrawn as the colon was fully examined. Estimated blood loss is zero unless otherwise noted in this procedure report.   COLON FINDINGS: There were left sided diverticulosis and small internal hemorrhoids.  The examination was otherwise normal. Retroflexed views revealed no abnormalities. The time to cecum = 4.2 Withdrawal time = 6.4   The scope was withdrawn and the procedure completed. COMPLICATIONS: There were no immediate  complications.  ENDOSCOPIC IMPRESSION: There were left sided diverticulosis and small internal hemorrhoids.  The examination was otherwise normal  RECOMMENDATIONS: You should continue to follow colorectal cancer screening guidelines for "routine risk" patients with a repeat colonoscopy in 10 years.   eSigned:  Milus Banister, MD 03/02/2015 7:51 AM

## 2015-03-03 ENCOUNTER — Telehealth: Payer: Self-pay

## 2015-03-03 NOTE — Telephone Encounter (Signed)
  Follow up Call-  Call back number 03/02/2015  Post procedure Call Back phone  # [phone]  Permission to leave phone message Yes     Patient questions:  Do you have a fever, pain , or abdominal swelling? No. Pain Score  0 *  Have you tolerated food without any problems? Yes.    Have you been able to return to your normal activities? Yes.    Do you have any questions about your discharge instructions: Diet   No. Medications  No. Follow up visit  No.  Do you have questions or concerns about your Care? No.  Actions: * If pain score is 4 or above: No action needed, pain <4.  No problems per the pt. maw

## 2015-08-07 ENCOUNTER — Telehealth: Payer: Self-pay | Admitting: Family Medicine

## 2015-08-07 NOTE — Telephone Encounter (Signed)
LM for pt to sch AWV on 8/25 at 9:45, before CPE, [place]

## 2015-08-16 ENCOUNTER — Encounter: Payer: Self-pay | Admitting: Family Medicine

## 2015-08-16 ENCOUNTER — Ambulatory Visit (INDEPENDENT_AMBULATORY_CARE_PROVIDER_SITE_OTHER): Payer: Medicare Other | Admitting: Family Medicine

## 2015-08-16 VITALS — BP 136/80 | HR 65 | Temp 98.0°F | Ht 72.25 in | Wt 213.2 lb

## 2015-08-16 DIAGNOSIS — M545 Low back pain, unspecified: Secondary | ICD-10-CM | POA: Insufficient documentation

## 2015-08-16 DIAGNOSIS — M546 Pain in thoracic spine: Principal | ICD-10-CM

## 2015-08-16 LAB — POC URINALSYSI DIPSTICK (AUTOMATED)
Bilirubin, UA: NEGATIVE
Blood, UA: NEGATIVE
Glucose, UA: NEGATIVE
Ketones, UA: NEGATIVE
LEUKOCYTES UA: NEGATIVE
Nitrite, UA: NEGATIVE
Protein, UA: NEGATIVE
Spec Grav, UA: 1.01
Urobilinogen, UA: 0.2
pH, UA: 6

## 2015-08-16 NOTE — Progress Notes (Signed)
Subjective:    Patient ID: Darren [name], male    DOB: 31-May-1945, 70 y.o.   MRN: [id number]  HPI Here for L flank pain   Started 2 mo ago- walking a lot on a trip  Began having L sided back discomfort (not severe)  Waxes and wanes since then  A little better today   Is pressure/ache Sometimes it travels to his side and almost to the L upper abd  occ wakes up with it - getting out of bed  Prolonged standing bothers him also  Pain does occ radiate to his leg (upper)  No numbness or weakness   Tried cranberry juice (thought it was kidney related) and water  No burning to urinate  No blood in urine  No rash    Hx of deg disc dz - MRI in the past  occ flare up of that   Wt Readings from Last 3 Encounters:  08/16/15 213 lb 4 oz (96.7 kg)  03/02/15 224 lb (101.6 kg)  02/17/15 224 lb (101.6 kg)   bmi of 28   UA is clear  Results for orders placed or performed in visit on 08/16/15  POCT Urinalysis Dipstick (Automated)  Result Value Ref Range   Color, UA Yellow    Clarity, UA Clear    Glucose, UA Negative    Bilirubin, UA Negative    Ketones, UA Negative    Spec Grav, UA 1.010    Blood, UA Negative    pH, UA 6.0    Protein, UA Negative    Urobilinogen, UA 0.2    Nitrite, UA Negative    Leukocytes, UA Negative Negative     Hx of BPH- and takes [facility]  No change in those symptoms   Naproxen one time helped    Patient Active Problem List   Diagnosis Date Noted  . Back pain of thoracolumbar region 08/16/2015  . Encounter for Medicare annual wellness exam 08/18/2014  . Routine general medical examination at a health care facility 08/10/2014  . Leukopenia 06/12/2011  . DYSPEPSIA&OTHER [place] DISORDERS FUNCTION STOMACH 09/21/2009  . PERSONAL HISTORY OF COLONIC POLYPS 09/21/2009  . Hyperlipidemia 12/02/2008  . GERD 12/02/2008  . ANXIETY DISORDER, SITUATIONAL, MILD 04/03/2008  . BPH (benign prostatic hyperplasia) 10/22/2006   Past Medical  History:  Diagnosis Date  . Basal cell carcinoma   . GERD (gastroesophageal reflux disease)   . History of colon polyps   . Hypertrophy of prostate without urinary obstruction and other lower urinary tract symptoms (LUTS)   . Leukocytopenia, unspecified   . Other and unspecified hyperlipidemia   . Overactive bladder    Past Surgical History:  Procedure Laterality Date  . PROSTATE SURGERY  08/2012   TURP  . ROTATOR CUFF REPAIR Right 2003   Social History  Substance Use Topics  . Smoking status: Former Smoker    Quit date: 01/24/1968  . Smokeless tobacco: Never Used  . Alcohol use 0.0 oz/week     Comment: 2 1/2 per day   Family History  Problem Relation Age of Onset  . Heart attack Father   . Breast cancer Mother   . Thyroid cancer Maternal Grandmother   . Colon cancer Neg Hx   . Esophageal cancer Neg Hx   . Rectal cancer Neg Hx   . Stomach cancer Neg Hx    Allergies  Allergen Reactions  . Penicillins     REACTION: allergy as a child   Current Outpatient  Prescriptions on File Prior to Visit  Medication Sig Dispense Refill  . ALPRAZolam (XANAX) 0.5 MG tablet Take 1 by mouth up to twice daily for airplane flight 15 tablet 0  . CIALIS 5 MG tablet Take 1 tablet by mouth daily.  1  . famotidine (PEPCID) 20 MG tablet Take 40 mg by mouth at bedtime.     Marland Kitchen MYRBETRIQ 50 MG TB24 tablet Take 50 mg by mouth daily.  0  . omeprazole (PRILOSEC OTC) 20 MG tablet Take 20 mg by mouth every morning.      No current facility-administered medications on file prior to visit.     Review of Systems Review of Systems  Constitutional: Negative for fever, appetite change, fatigue and unexpected weight change.  Eyes: Negative for pain and visual disturbance.  Respiratory: Negative for cough and shortness of breath.   Cardiovascular: Negative for cp or palpitations    Gastrointestinal: Negative for nausea, diarrhea and constipation.  Genitourinary: Negative for urgency and frequency. neg for  hematuria or bladder discomfort, pos for nocturia from BPH MSK pos for L flank pain  Skin: Negative for pallor or rash   Neurological: Negative for weakness, light-headedness, numbness and headaches.  Hematological: Negative for adenopathy. Does not bruise/bleed easily.  Psychiatric/Behavioral: Negative for dysphoric mood. The patient is not nervous/anxious.         Objective:   Physical Exam  Constitutional: He appears well-developed and well-nourished. No distress.  Well appearing   HENT:  Head: Normocephalic and atraumatic.  Eyes: Conjunctivae and EOM are normal. Pupils are equal, round, and reactive to light. No scleral icterus.  Neck: Normal range of motion. Neck supple.  Cardiovascular: Normal rate and regular rhythm.   Pulmonary/Chest: Effort normal and breath sounds normal. He has no wheezes. He has no rales.  Abdominal: Soft. Bowel sounds are normal. He exhibits no distension. There is no tenderness.  Musculoskeletal: He exhibits tenderness. He exhibits no deformity.       Lumbar back: He exhibits decreased range of motion, tenderness and spasm. He exhibits no bony tenderness and no edema.  Mildly tender in L low thoracic/high lumbar musculature (and some spasm on palpation) No spinous process tenderness Mild loss of lordosis  No skin change or signs of traume Nl rom spine with pain on full extension Nl gait Neg SLR and no neuro changes   No CVA tenderness to tapping   Lymphadenopathy:    He has no cervical adenopathy.  Neurological: He is alert. He has normal strength and normal reflexes. He displays no atrophy. No cranial nerve deficit or sensory deficit. He exhibits normal muscle tone. Coordination normal.  Negative SLR  Skin: Skin is warm and dry. No rash noted. No erythema. No pallor.  Psychiatric: He has a normal mood and affect.          Assessment & Plan:   Problem List Items Addressed This Visit      Other   Back pain of thoracolumbar region    L  sided  ua normal  Happened after prolonged walking [place] [address]  Not severe  Recommend getting back to back exercises  (hx of some deg disc) Reassuring exam  Recommend heat/ice  Update if worse or not improved in 2 weeks (pt has wellness exam upcoming and we can re assess)      Relevant Medications   aspirin EC 81 MG tablet    Other Visit Diagnoses    Low back pain, unspecified back pain laterality, with  sciatica presence unspecified    -  Primary   Relevant Medications   aspirin EC 81 MG tablet   Other Relevant Orders   POCT Urinalysis Dipstick (Automated) (Completed)

## 2015-08-16 NOTE — Patient Instructions (Signed)
I think you have some strain/spasm of thoracolumbar part of the back  Urine test is normal  Use heat 10 minutes at a time (ice also if helpful)  Get back to your back exercises regularly (unless they hurt)  Naproxen occasionally is ok to use  Keep me posted

## 2015-08-16 NOTE — Assessment & Plan Note (Addendum)
L sided  ua normal  Happened after prolonged walking [place] [address]  Not severe  Recommend getting back to back exercises  (hx of some deg disc) Reassuring exam  Recommend heat/ice  Update if worse or not improved in 2 weeks (pt has wellness exam upcoming and we can re assess)

## 2015-08-16 NOTE — Progress Notes (Signed)
Pre visit review using our clinic review tool, if applicable. No additional management support is needed unless otherwise documented below in the visit note. 

## 2015-08-20 ENCOUNTER — Encounter: Payer: Medicare Other | Admitting: Family Medicine

## 2015-09-12 ENCOUNTER — Telehealth: Payer: Self-pay | Admitting: Family Medicine

## 2015-09-12 DIAGNOSIS — Z0000 Encounter for general adult medical examination without abnormal findings: Secondary | ICD-10-CM

## 2015-09-12 DIAGNOSIS — N400 Enlarged prostate without lower urinary tract symptoms: Secondary | ICD-10-CM

## 2015-09-12 NOTE — Telephone Encounter (Signed)
-----   Message from Marchia Bond sent at 09/06/2015  3:35 PM EDT ----- Regarding: Cpx labs Mon 8/21, need orders. Thanks! :-) Please order  future cpx labs for pt's upcoming lab appt. Thanks Aniceto Boss

## 2015-09-13 ENCOUNTER — Other Ambulatory Visit (INDEPENDENT_AMBULATORY_CARE_PROVIDER_SITE_OTHER): Payer: Medicare Other

## 2015-09-13 DIAGNOSIS — N400 Enlarged prostate without lower urinary tract symptoms: Secondary | ICD-10-CM | POA: Diagnosis not present

## 2015-09-13 DIAGNOSIS — Z0000 Encounter for general adult medical examination without abnormal findings: Secondary | ICD-10-CM

## 2015-09-13 LAB — CBC WITH DIFFERENTIAL/PLATELET
BASOS ABS: 0 10*3/uL (ref 0.0–0.1)
Basophils Relative: 0.6 % (ref 0.0–3.0)
EOS PCT: 1.9 % (ref 0.0–5.0)
Eosinophils Absolute: 0.1 10*3/uL (ref 0.0–0.7)
HEMATOCRIT: 40.4 % (ref 39.0–52.0)
HEMOGLOBIN: 13.9 g/dL (ref 13.0–17.0)
LYMPHS ABS: 1.1 10*3/uL (ref 0.7–4.0)
LYMPHS PCT: 26.9 % (ref 12.0–46.0)
MCHC: 34.3 g/dL (ref 30.0–36.0)
MCV: 97.8 fl (ref 78.0–100.0)
MONOS PCT: 11.1 % (ref 3.0–12.0)
Monocytes Absolute: 0.5 10*3/uL (ref 0.1–1.0)
NEUTROS PCT: 59.5 % (ref 43.0–77.0)
Neutro Abs: 2.5 10*3/uL (ref 1.4–7.7)
Platelets: 145 10*3/uL — ABNORMAL LOW (ref 150.0–400.0)
RBC: 4.13 Mil/uL — AB (ref 4.22–5.81)
RDW: 12.9 % (ref 11.5–15.5)
WBC: 4.2 10*3/uL (ref 4.0–10.5)

## 2015-09-13 LAB — COMPREHENSIVE METABOLIC PANEL
ALBUMIN: 4 g/dL (ref 3.5–5.2)
ALK PHOS: 40 U/L (ref 39–117)
ALT: 17 U/L (ref 0–53)
AST: 15 U/L (ref 0–37)
BILIRUBIN TOTAL: 0.5 mg/dL (ref 0.2–1.2)
BUN: 16 mg/dL (ref 6–23)
CALCIUM: 8.8 mg/dL (ref 8.4–10.5)
CO2: 28 mEq/L (ref 19–32)
Chloride: 105 mEq/L (ref 96–112)
Creatinine, Ser: 0.89 mg/dL (ref 0.40–1.50)
GFR: 89.67 mL/min (ref >60.00)
Glucose, Bld: 93 mg/dL (ref 70–99)
POTASSIUM: 4.2 meq/L (ref 3.5–5.1)
Sodium: 140 mEq/L (ref 135–145)
TOTAL PROTEIN: 6.1 g/dL (ref 6.0–8.3)

## 2015-09-13 LAB — LIPID PANEL
Cholesterol: 203 mg/dL — ABNORMAL HIGH (ref 0–200)
HDL: 63.7 mg/dL (ref >39.00)
LDL CALC: 128 mg/dL — AB (ref 0–99)
NONHDL: 139.67
Total CHOL/HDL Ratio: 3
Triglycerides: 57 mg/dL (ref 0.0–149.0)
VLDL: 11.4 mg/dL (ref 0.0–40.0)

## 2015-09-13 LAB — TSH: TSH: 3.05 u[IU]/mL (ref 0.35–4.50)

## 2015-09-13 LAB — PSA: PSA: 0.58 ng/mL (ref 0.10–4.00)

## 2015-09-17 ENCOUNTER — Ambulatory Visit (INDEPENDENT_AMBULATORY_CARE_PROVIDER_SITE_OTHER): Payer: Medicare Other | Admitting: Family Medicine

## 2015-09-17 ENCOUNTER — Encounter: Payer: Self-pay | Admitting: Family Medicine

## 2015-09-17 ENCOUNTER — Ambulatory Visit (INDEPENDENT_AMBULATORY_CARE_PROVIDER_SITE_OTHER): Payer: Medicare Other

## 2015-09-17 VITALS — BP 128/70 | HR 58 | Temp 97.9°F | Ht 72.0 in | Wt 215.5 lb

## 2015-09-17 DIAGNOSIS — N400 Enlarged prostate without lower urinary tract symptoms: Secondary | ICD-10-CM | POA: Diagnosis not present

## 2015-09-17 DIAGNOSIS — Z0000 Encounter for general adult medical examination without abnormal findings: Secondary | ICD-10-CM

## 2015-09-17 DIAGNOSIS — Z1159 Encounter for screening for other viral diseases: Secondary | ICD-10-CM | POA: Diagnosis not present

## 2015-09-17 DIAGNOSIS — Z23 Encounter for immunization: Secondary | ICD-10-CM

## 2015-09-17 DIAGNOSIS — E785 Hyperlipidemia, unspecified: Secondary | ICD-10-CM

## 2015-09-17 NOTE — Assessment & Plan Note (Signed)
Reviewed health habits including diet and exercise and skin cancer prevention Reviewed appropriate screening tests for age  Also reviewed health mt list, fam hx and immunization status , as well as social and family history   See HPI Labs reviewed AMW reviewed Get a flu shot in the fall as planned  Keep taking care of yourself Keep exercising - for mind and body health Hepatitis C screening test today  If you want to take a supplement - vitamin D3 over the counter- 1000 iu daily - with a meal

## 2015-09-17 NOTE — Progress Notes (Signed)
PCP notes:   Health maintenance:  PCV13 - administered Hep C screening - completed Flu vaccine - addressed  Abnormal screenings:   Hearing - failed  Patient concerns:   None  Nurse concerns:  None  Next PCP appt:   09/17/2015 @ 1030  I reviewed health advisor's note, was available for consultation, and agree with documentation and plan.  Loura Pardon MD

## 2015-09-17 NOTE — Assessment & Plan Note (Signed)
Disc goals for lipids and reasons to control them Rev labs with pt Rev low sat fat diet in detail  LDL improved Favorable lipo science profile in the past

## 2015-09-17 NOTE — Progress Notes (Signed)
Pre visit review using our clinic review tool, if applicable. No additional management support is needed unless otherwise documented below in the visit note. 

## 2015-09-17 NOTE — Assessment & Plan Note (Signed)
Lab Results  Component Value Date   PSA 0.58 09/13/2015   PSA 1.99 08/11/2014   PSA 0.13 05/06/2013   Symptoms are stable Hx of TURP On myrbetriq and cialist  Sees urology-will continue f/u

## 2015-09-17 NOTE — Progress Notes (Signed)
Subjective:   Darren [name] is a 70 y.o. male who presents for Medicare Annual/Subsequent preventive examination.  Review of Systems:  N/A Cardiac Risk Factors include: advanced age (>52men, >71 women);male gender;dyslipidemia     Objective:    Vitals: BP 128/70 (BP Location: Left Arm, Patient Position: Sitting, Cuff Size: Normal)   Pulse (!) 58   Temp 97.9 F (36.6 C) (Oral)   Ht 6' (1.829 m) Comment: no shoes  Wt 215 lb 8 oz (97.8 kg)   SpO2 96%   BMI 29.23 kg/m   Body mass index is 29.23 kg/m.  Tobacco History  Smoking Status  . Former Smoker  . Quit date: 01/24/1968  Smokeless Tobacco  . Never Used     Counseling given: No   Past Medical History:  Diagnosis Date  . Basal cell carcinoma   . GERD (gastroesophageal reflux disease)   . History of colon polyps   . Hypertrophy of prostate without urinary obstruction and other lower urinary tract symptoms (LUTS)   . Leukocytopenia, unspecified   . Other and unspecified hyperlipidemia   . Overactive bladder    Past Surgical History:  Procedure Laterality Date  . PROSTATE SURGERY  08/2012   TURP  . ROTATOR CUFF REPAIR Right 2003   Family History  Problem Relation Age of Onset  . Heart attack Father   . Breast cancer Mother   . Thyroid cancer Maternal Grandmother   . Colon cancer Neg Hx   . Esophageal cancer Neg Hx   . Rectal cancer Neg Hx   . Stomach cancer Neg Hx    History  Sexual Activity  . Sexual activity: Yes    Outpatient Encounter Prescriptions as of 09/17/2015  Medication Sig  . ALPRAZolam (XANAX) 0.5 MG tablet Take 1 by mouth up to twice daily for airplane flight  . aspirin EC 81 MG tablet Take 81 mg by mouth daily.  Marland Kitchen CIALIS 5 MG tablet Take 1 tablet by mouth daily.  . famotidine (PEPCID) 20 MG tablet Take 40 mg by mouth at bedtime.   Marland Kitchen MYRBETRIQ 50 MG TB24 tablet Take 50 mg by mouth daily.  Marland Kitchen omeprazole (PRILOSEC OTC) 20 MG tablet Take 20 mg by mouth every morning.    No  facility-administered encounter medications on file as of 09/17/2015.     Activities of Daily Living In your present state of health, do you have any difficulty performing the following activities: 09/17/2015  Hearing? N  Vision? N  Difficulty concentrating or making decisions? N  Walking or climbing stairs? N  Dressing or bathing? N  Doing errands, shopping? N  Preparing Food and eating ? N  Using the Toilet? N  In the past six months, have you accidently leaked urine? N  Do you have problems with loss of bowel control? N  Managing your Medications? N  Managing your Finances? N  Housekeeping or managing your Housekeeping? N  Some recent data might be hidden    Patient Care Team: Abner Greenspan, MD as PCP - General   Assessment:     Hearing Screening   125Hz  250Hz  500Hz  1000Hz  2000Hz  3000Hz  4000Hz  6000Hz  8000Hz   Right ear:   0 0 40  40    Left ear:   0 0 40  40    Vision Screening Comments: Last vision exam on 05/13/2015 with Dr. Glennon Mac   Exercise Activities and Dietary recommendations Current Exercise Habits: Home exercise routine, Type of exercise: strength training/weights;walking, Time (Minutes): 60,  Frequency (Times/Week): 6, Weekly Exercise (Minutes/Week): 360, Intensity: Moderate, Exercise limited by: None identified  Goals    . Increase physical activity          Starting 09/17/2015, I will continue to exercise for at least 60 min 5-6 days per week.       Fall Risk Fall Risk  09/17/2015 08/18/2014 05/13/2013  Falls in the past year? No No No   Depression Screen PHQ 2/9 Scores 09/17/2015 08/18/2014 05/13/2013  PHQ - 2 Score 0 0 0    Cognitive Testing MMSE - Mini Mental State Exam 09/17/2015  Orientation to time 5  Orientation to Place 5  Registration 3  Attention/ Calculation 0  Recall 3  Language- name 2 objects 0  Language- repeat 1  Language- follow 3 step command 3  Language- read & follow direction 0  Write a sentence 0  Copy design 0  Total score 20     PLEASE NOTE: A Mini-Cog screen was completed. Maximum score is 20. A value of 0 denotes this part of [place] MMSE was not completed or the patient failed this part of the Mini-Cog screening.   Mini-Cog Screening Orientation to Time - Max 5 pts Orientation to Place - Max 5 pts Registration - Max 3 pts Recall - Max 3 pts Language Repeat - Max 1 pts Language Follow 3 Step Command - Max 3 pts  Immunization History  Administered Date(s) Administered  . H1N1 02/06/2008  . Hepatitis A 11/26/2003, 06/24/2004  . Influenza Split 10/26/2010, 10/23/2011  . Influenza Whole 12/15/2003, 11/16/2006, 10/27/2008, 10/14/2009  . Influenza,inj,Quad PF,36+ Mos 10/09/2012, 10/29/2013, 10/13/2014  . Pneumococcal Conjugate-13 09/17/2015  . Pneumococcal Polysaccharide-23 09/09/2007, 05/13/2013  . Td 07/29/1998, 12/02/2008  . Zoster 09/28/2005   Screening Tests Health Maintenance  Topic Date Due  . INFLUENZA VACCINE  01/23/2016 (Originally 08/24/2015)  . TETANUS/TDAP  12/03/2018  . COLONOSCOPY  03/01/2025  . ZOSTAVAX  Completed  . Hepatitis C Screening  Completed  . PNA vac Low Risk Adult  Completed      Plan:     I have personally reviewed and addressed the Medicare Annual Wellness questionnaire and have noted the following in the patient's chart:  A. Medical and social history B. Use of alcohol, tobacco or illicit drugs  C. Current medications and supplements D. Functional ability and status E.  Nutritional status F.  Physical activity G. Advance directives H. List of other physicians I.  Hospitalizations, surgeries, and ER visits in previous 12 months J.  [place] to include hearing, vision, cognitive, depression L. Referrals and appointments - none  In addition, I have reviewed and discussed with patient certain preventive protocols, quality metrics, and best practice recommendations. A written personalized care plan for preventive services as well as general preventive  health recommendations were provided to patient.  See attached scanned questionnaire for additional information.   Signed,   Lindell Noe, MHA, BS, LPN Health Advisor

## 2015-09-17 NOTE — Patient Instructions (Addendum)
Get a flu shot in the fall as planned  Keep taking care of yourself Keep exercising - for mind and body health Hepatitis C screening test today  If you want to take a supplement - vitamin D3 over the counter- 1000 iu daily - with a meal

## 2015-09-17 NOTE — Progress Notes (Signed)
Subjective:    Patient ID: Darren [name], male    DOB: 03-28-1945, 70 y.o.   MRN: [id number]  HPI Here for health maintenance exam and to review chronic medical problems   Traveled a lot this summer  [place] and [place]  Doing well   Feeling good overall  Taking care of himself    Had AMW today  No falls No depression  Some hearing deficit in [place] (he was aware- he occ notices it)  Not bothersome enough to get a hearing aide    Wt Readings from Last 3 Encounters:  09/17/15 215 lb 8 oz (97.8 kg)  09/17/15 215 lb 8 oz (97.8 kg)  08/16/15 213 lb 4 oz (96.7 kg)   bmi is 29.2 Exercises regularly   Colonoscopy 2/17= was ok with a 10 year recall  Diverticulosis    Hx of BPH Lab Results  Component Value Date   PSA 0.58 09/13/2015   PSA 1.99 08/11/2014   PSA 0.13 05/06/2013   prostate surg/TURP in 2014 Still had urinary symptoms and takes myrbetriq and cialis for ED  Worse at night-nocturia 1-2 times-about the same /not worsening   Flu shot -will get later in the fall   Had a pneumonia shot today   Hep C screening planned for today  Hx of hyperlipidemia Lab Results  Component Value Date   CHOL 203 (H) 09/13/2015   CHOL 235 (H) 08/11/2014   CHOL 215 (H) 05/06/2013   Lab Results  Component Value Date   HDL 63.70 09/13/2015   HDL 67.50 08/11/2014   HDL 55.50 05/06/2013   Lab Results  Component Value Date   LDLCALC 128 (H) 09/13/2015   LDLCALC 152 (H) 08/11/2014   LDLCALC 144 (H) 05/06/2013   Lab Results  Component Value Date   TRIG 57.0 09/13/2015   TRIG 78.0 08/11/2014   TRIG 80.0 05/06/2013   Lab Results  Component Value Date   CHOLHDL 3 09/13/2015   CHOLHDL 3 08/11/2014   CHOLHDL 4 05/06/2013   Lab Results  Component Value Date   LDLDIRECT 127.7 02/15/2009   LDLDIRECT 144.2 12/02/2008   LDLDIRECT 134.0 03/21/2007   Favorable lipo science profile in the past  Eating less red meat in general  Does eat a healthy diet  occ  bacon   Results for orders placed or performed in visit on 09/13/15  Comprehensive metabolic panel  Result Value Ref Range   Sodium 140 135 - 145 mEq/L   Potassium 4.2 3.5 - 5.1 mEq/L   Chloride 105 96 - 112 mEq/L   CO2 28 19 - 32 mEq/L   Glucose, Bld 93 70 - 99 mg/dL   BUN 16 6 - 23 mg/dL   Creatinine, Ser 0.89 0.40 - 1.50 mg/dL   Total Bilirubin 0.5 0.2 - 1.2 mg/dL   Alkaline Phosphatase 40 39 - 117 U/L   AST 15 0 - 37 U/L   ALT 17 0 - 53 U/L   Total Protein 6.1 6.0 - 8.3 g/dL   Albumin 4.0 3.5 - 5.2 g/dL   Calcium 8.8 8.4 - 10.5 mg/dL   GFR 89.67 >60.00 mL/min  CBC with Differential/Platelet  Result Value Ref Range   WBC 4.2 4.0 - 10.5 K/uL   RBC 4.13 (L) 4.22 - 5.81 Mil/uL   Hemoglobin 13.9 13.0 - 17.0 g/dL   HCT 40.4 39.0 - 52.0 %   MCV 97.8 78.0 - 100.0 fl   MCHC 34.3 30.0 - 36.0 g/dL  RDW 12.9 11.5 - 15.5 %   Platelets 145.0 (L) 150.0 - 400.0 K/uL   Neutrophils Relative % 59.5 43.0 - 77.0 %   Lymphocytes Relative 26.9 12.0 - 46.0 %   Monocytes Relative 11.1 3.0 - 12.0 %   Eosinophils Relative 1.9 0.0 - 5.0 %   Basophils Relative 0.6 0.0 - 3.0 %   Neutro Abs 2.5 1.4 - 7.7 K/uL   Lymphs Abs 1.1 0.7 - 4.0 K/uL   Monocytes Absolute 0.5 0.1 - 1.0 K/uL   Eosinophils Absolute 0.1 0.0 - 0.7 K/uL   Basophils Absolute 0.0 0.0 - 0.1 K/uL  Lipid panel  Result Value Ref Range   Cholesterol 203 (H) 0 - 200 mg/dL   Triglycerides 57.0 0.0 - 149.0 mg/dL   HDL 63.70 >39.00 mg/dL   VLDL 11.4 0.0 - 40.0 mg/dL   LDL Cholesterol 128 (H) 0 - 99 mg/dL   Total CHOL/HDL Ratio 3    NonHDL 139.67   PSA  Result Value Ref Range   PSA 0.58 0.10 - 4.00 ng/mL  TSH  Result Value Ref Range   TSH 3.05 0.35 - 4.50 uIU/mL     Patient Active Problem List   Diagnosis Date Noted  . Back pain of thoracolumbar region 08/16/2015  . Encounter for Medicare annual wellness exam 08/18/2014  . Routine general medical examination at a health care facility 08/10/2014  . Leukopenia 06/12/2011  .  DYSPEPSIA&OTHER [place] DISORDERS FUNCTION STOMACH 09/21/2009  . PERSONAL HISTORY OF COLONIC POLYPS 09/21/2009  . Hyperlipidemia 12/02/2008  . GERD 12/02/2008  . ANXIETY DISORDER, SITUATIONAL, MILD 04/03/2008  . BPH (benign prostatic hyperplasia) 10/22/2006   Past Medical History:  Diagnosis Date  . Basal cell carcinoma   . GERD (gastroesophageal reflux disease)   . History of colon polyps   . Hypertrophy of prostate without urinary obstruction and other lower urinary tract symptoms (LUTS)   . Leukocytopenia, unspecified   . Other and unspecified hyperlipidemia   . Overactive bladder    Past Surgical History:  Procedure Laterality Date  . PROSTATE SURGERY  08/2012   TURP  . ROTATOR CUFF REPAIR Right 2003   Social History  Substance Use Topics  . Smoking status: Former Smoker    Quit date: 01/24/1968  . Smokeless tobacco: Never Used  . Alcohol use 0.0 oz/week     Comment: 2 1/2 per day   Family History  Problem Relation Age of Onset  . Heart attack Father   . Breast cancer Mother   . Thyroid cancer Maternal Grandmother   . Colon cancer Neg Hx   . Esophageal cancer Neg Hx   . Rectal cancer Neg Hx   . Stomach cancer Neg Hx    Allergies  Allergen Reactions  . Penicillins     REACTION: allergy as a child   Current Outpatient Prescriptions on File Prior to Visit  Medication Sig Dispense Refill  . ALPRAZolam (XANAX) 0.5 MG tablet Take 1 by mouth up to twice daily for airplane flight 15 tablet 0  . aspirin EC 81 MG tablet Take 81 mg by mouth daily.    Marland Kitchen CIALIS 5 MG tablet Take 1 tablet by mouth daily.  1  . famotidine (PEPCID) 20 MG tablet Take 40 mg by mouth at bedtime.     Marland Kitchen MYRBETRIQ 50 MG TB24 tablet Take 50 mg by mouth daily.  0  . omeprazole (PRILOSEC OTC) 20 MG tablet Take 20 mg by mouth every morning.  No current facility-administered medications on file prior to visit.     Review of Systems    Review of Systems  Constitutional: Negative for fever, appetite  change, fatigue and unexpected weight change.  Eyes: Negative for pain and visual disturbance.  Respiratory: Negative for cough and shortness of breath.   Cardiovascular: Negative for cp or palpitations    Gastrointestinal: Negative for nausea, diarrhea and constipation.  Genitourinary: Negative for urgency and frequency.  Skin: Negative for pallor or rash   Neurological: Negative for weakness, light-headedness, numbness and headaches.  Hematological: Negative for adenopathy. Does not bruise/bleed easily.  Psychiatric/Behavioral: Negative for dysphoric mood. The patient is not nervous/anxious.      Objective:   Physical Exam  Constitutional: He appears well-developed and well-nourished. No distress.  overwt and well appearing   HENT:  Head: Normocephalic and atraumatic.  Right Ear: External ear normal.  Left Ear: External ear normal.  Nose: Nose normal.  Mouth/Throat: Oropharynx is clear and moist.  Eyes: Conjunctivae and EOM are normal. Pupils are equal, round, and reactive to light. Right eye exhibits no discharge. Left eye exhibits no discharge. No scleral icterus.  Neck: Normal range of motion. Neck supple. No JVD present. Carotid bruit is not present. No thyromegaly present.  Cardiovascular: Normal rate, regular rhythm, normal heart sounds and intact distal pulses.  Exam reveals no gallop.   Pulmonary/Chest: Effort normal and breath sounds normal. No respiratory distress. He has no wheezes. He exhibits no tenderness.  Abdominal: Soft. Bowel sounds are normal. He exhibits no distension, no abdominal bruit and no mass. There is no tenderness.  Musculoskeletal: He exhibits no edema or tenderness.  Lymphadenopathy:    He has no cervical adenopathy.  Neurological: He is alert. He has normal reflexes. No cranial nerve deficit. He exhibits normal muscle tone. Coordination normal.  Skin: Skin is warm and dry. No rash noted. No erythema. No pallor.  Fair complexion   Psychiatric: He has  a normal mood and affect.          Assessment & Plan:   Problem List Items Addressed This Visit      Genitourinary   BPH (benign prostatic hyperplasia) - Primary    Lab Results  Component Value Date   PSA 0.58 09/13/2015   PSA 1.99 08/11/2014   PSA 0.13 05/06/2013   Symptoms are stable Hx of TURP On myrbetriq and cialist  Sees urology-will continue f/u        Other   Routine general medical examination at a health care facility    Reviewed health habits including diet and exercise and skin cancer prevention Reviewed appropriate screening tests for age  Also reviewed health mt list, fam hx and immunization status , as well as social and family history   See HPI Labs reviewed AMW reviewed Get a flu shot in the fall as planned  Keep taking care of yourself Keep exercising - for mind and body health Hepatitis C screening test today  If you want to take a supplement - vitamin D3 over the counter- 1000 iu daily - with a meal       Hyperlipidemia    Disc goals for lipids and reasons to control them Rev labs with pt Rev low sat fat diet in detail  LDL improved Favorable lipo science profile in the past        Other Visit Diagnoses   None.

## 2015-09-17 NOTE — Patient Instructions (Signed)
Darren [name] , Thank you for taking time to come for your Medicare Wellness Visit. I appreciate your ongoing commitment to your health goals. Please review the following plan we discussed and let me know if I can assist you in the future.   These are the goals we discussed: Goals    . Increase physical activity          Starting 09/17/2015, I will continue to exercise for at least 60 min 5-6 days per week.        This is a list of the screening recommended for you and due dates:  Health Maintenance  Topic Date Due  . Flu Shot  01/23/2016*  . Tetanus Vaccine  12/03/2018  . Colon Cancer Screening  03/01/2025  . Shingles Vaccine  Completed  .  Hepatitis C: One time screening is recommended by Center for Disease Control  (CDC) for  adults born from [birth year] through 1965.   Completed  . Pneumonia vaccines  Completed  *Topic was postponed. The date shown is not the original due date.   Preventive Care for Adults  A healthy lifestyle and preventive care can promote health and wellness. Preventive health guidelines for adults include the following key practices.  . A routine yearly physical is a good way to check with your health care provider about your health and preventive screening. It is a chance to share any concerns and updates on your health and to receive a thorough exam.  . Visit your dentist for a routine exam and preventive care every 6 months. Brush your teeth twice a day and floss once a day. Good oral hygiene prevents tooth decay and gum disease.  . The frequency of eye exams is based on your age, health, family medical history, use  of contact lenses, and other factors. Follow your health care provider's ecommendations for frequency of eye exams.  . Eat a healthy diet. Foods like vegetables, fruits, whole grains, low-fat dairy products, and lean protein foods contain the nutrients you need without too many calories. Decrease your intake of foods high in solid fats, added  sugars, and salt. Eat the right amount of calories for you. Get information about a proper diet from your health care provider, if necessary.  . Regular physical exercise is one of the most important things you can do for your health. Most adults should get at least 150 minutes of moderate-intensity exercise (any activity that increases your heart rate and causes you to sweat) each week. In addition, most adults need muscle-strengthening exercises on 2 or more days a week.  Silver Sneakers may be a benefit available to you. To determine eligibility, you may visit the website: www.silversneakers.com or contact program at 978-452-7454 Mon-Fri between 8AM-8PM.   . Maintain a healthy weight. The body mass index (BMI) is a screening tool to identify possible weight problems. It provides an estimate of body fat based on height and weight. Your health care provider can find your BMI and can help you achieve or maintain a healthy weight.   For adults 20 years and older: ? A BMI below 18.5 is considered underweight. ? A BMI of 18.5 to 24.9 is normal. ? A BMI of 25 to 29.9 is considered overweight. ? A BMI of 30 and above is considered obese.   . Maintain normal blood lipids and cholesterol levels by exercising and minimizing your intake of saturated fat. Eat a balanced diet with plenty of fruit and vegetables. Blood tests for lipids and  cholesterol should begin at age 17 and be repeated every 5 years. If your lipid or cholesterol levels are high, you are over 50, or you are at high risk for heart disease, you may need your cholesterol levels checked more frequently. Ongoing high lipid and cholesterol levels should be treated with medicines if diet and exercise are not working.  . If you smoke, find out from your health care provider how to quit. If you do not use tobacco, please do not start.  . If you choose to drink alcohol, please do not consume more than 2 drinks per day. One drink is considered to  be 12 ounces (355 mL) of beer, 5 ounces (148 mL) of wine, or 1.5 ounces (44 mL) of liquor.  . If you are 39-28 years old, ask your health care provider if you should take aspirin to prevent strokes.  . Use sunscreen. Apply sunscreen liberally and repeatedly throughout the day. You should seek shade when your shadow is shorter than you. Protect yourself by wearing long sleeves, pants, a wide-brimmed hat, and sunglasses year round, whenever you are outdoors.  . Once a month, do a whole body skin exam, using a mirror to look at the skin on your back. Tell your health care provider of new moles, moles that have irregular borders, moles that are larger than a pencil eraser, or moles that have changed in shape or color.

## 2015-09-18 LAB — HEPATITIS C ANTIBODY: HCV Ab: NEGATIVE

## 2015-10-29 ENCOUNTER — Ambulatory Visit (INDEPENDENT_AMBULATORY_CARE_PROVIDER_SITE_OTHER): Payer: Medicare Other

## 2015-10-29 DIAGNOSIS — Z23 Encounter for immunization: Secondary | ICD-10-CM | POA: Diagnosis not present

## 2016-04-12 ENCOUNTER — Ambulatory Visit (INDEPENDENT_AMBULATORY_CARE_PROVIDER_SITE_OTHER): Payer: Medicare Other | Admitting: Family Medicine

## 2016-04-12 ENCOUNTER — Encounter: Payer: Self-pay | Admitting: Family Medicine

## 2016-04-12 ENCOUNTER — Ambulatory Visit (INDEPENDENT_AMBULATORY_CARE_PROVIDER_SITE_OTHER)
Admission: RE | Admit: 2016-04-12 | Discharge: 2016-04-12 | Disposition: A | Discharge status: Home / Self Care | Payer: Medicare Other | Source: Ambulatory Visit | Attending: Family Medicine | Admitting: Family Medicine

## 2016-04-12 VITALS — BP 120/74 | HR 66 | Temp 98.4°F | Ht 72.0 in | Wt 215.0 lb

## 2016-04-12 DIAGNOSIS — M79671 Pain in right foot: Secondary | ICD-10-CM

## 2016-04-12 NOTE — Progress Notes (Signed)
Pre visit review using our clinic review tool, if applicable. No additional management support is needed unless otherwise documented below in the visit note. 

## 2016-04-12 NOTE — Patient Instructions (Signed)
Elevate and ice foot every chance you get - 10 minutes at a time Wear supportive shoes when walking -even in the house Xray now - and we will make a plan after  Try 1 aleve (naproxen) with meal twice daily for 7 days (stop if it bothers your stomach)

## 2016-04-12 NOTE — Progress Notes (Signed)
Subjective:    Patient ID: Darren [name], male    DOB: [birth year], 71 y.o.   MRN: [id number]  HPI  Here for R foot pain   2 weeks ago at the gym  Was doing some exercises to strengthen arch of foot  Several hours later had pain in dorsal/lateral R foot  Did not get better-limping around   Also using elliptical machine  Not running however  He walks a lot in general   No particular movements make it worse -just hurts to walk    Did not swell up or bruise   No hx of bone loss   Has flat feet and wears inserts in shoes   Did not take anything for it No ice     Wt Readings from Last 3 Encounters:  04/12/16 215 lb (97.5 kg)  09/17/15 215 lb 8 oz (97.8 kg)  09/17/15 215 lb 8 oz (97.8 kg)   bmi 29.1  Patient Active Problem List   Diagnosis Date Noted  . Right foot pain 04/12/2016  . Back pain of thoracolumbar region 08/16/2015  . Encounter for Medicare annual wellness exam 08/18/2014  . Routine general medical examination at a health care facility 08/10/2014  . Leukopenia 06/12/2011  . DYSPEPSIA&OTHER [place] DISORDERS FUNCTION STOMACH 09/21/2009  . PERSONAL HISTORY OF COLONIC POLYPS 09/21/2009  . Hyperlipidemia 12/02/2008  . GERD 12/02/2008  . ANXIETY DISORDER, SITUATIONAL, MILD 04/03/2008  . BPH (benign prostatic hyperplasia) 10/22/2006   Past Medical History:  Diagnosis Date  . Basal cell carcinoma   . GERD (gastroesophageal reflux disease)   . History of colon polyps   . Hypertrophy of prostate without urinary obstruction and other lower urinary tract symptoms (LUTS)   . Leukocytopenia, unspecified   . Other and unspecified hyperlipidemia   . Overactive bladder    Past Surgical History:  Procedure Laterality Date  . PROSTATE SURGERY  08/2012   TURP  . ROTATOR CUFF REPAIR Right 2003   Social History  Substance Use Topics  . Smoking status: Former Smoker    Quit date: 01/24/1968  . Smokeless tobacco: Never Used  . Alcohol use 0.0 oz/week   Comment: 2 1/2 per day   Family History  Problem Relation Age of Onset  . Heart attack Father   . Breast cancer Mother   . Thyroid cancer Maternal Grandmother   . Colon cancer Neg Hx   . Esophageal cancer Neg Hx   . Rectal cancer Neg Hx   . Stomach cancer Neg Hx    Allergies  Allergen Reactions  . Penicillins     REACTION: allergy as a child   Current Outpatient Prescriptions on File Prior to Visit  Medication Sig Dispense Refill  . ALPRAZolam (XANAX) 0.5 MG tablet Take 1 by mouth up to twice daily for airplane flight 15 tablet 0  . aspirin EC 81 MG tablet Take 81 mg by mouth daily.    Marland Kitchen CIALIS 5 MG tablet Take 1 tablet by mouth daily.  1  . famotidine (PEPCID) 20 MG tablet Take 40 mg by mouth at bedtime.     Marland Kitchen MYRBETRIQ 50 MG TB24 tablet Take 50 mg by mouth daily.  0  . omeprazole (PRILOSEC OTC) 20 MG tablet Take 20 mg by mouth every morning.      No current facility-administered medications on file prior to visit.     Review of Systems    Review of Systems  Constitutional: Negative for fever, appetite change, fatigue and  unexpected weight change.  Eyes: Negative for pain and visual disturbance.  Respiratory: Negative for cough and shortness of breath.   Cardiovascular: Negative for cp or palpitations    Gastrointestinal: Negative for nausea, diarrhea and constipation.  Genitourinary: Negative for urgency and frequency.  Skin: Negative for pallor or rash   MSK pos for R foot pain when walking  Neurological: Negative for weakness, light-headedness, numbness and headaches.  Hematological: Negative for adenopathy. Does not bruise/bleed easily.  Psychiatric/Behavioral: Negative for dysphoric mood. The patient is not nervous/anxious.      Objective:   Physical Exam  Constitutional: He appears well-developed and well-nourished. No distress.  overwt and well appearing   HENT:  Head: Normocephalic and atraumatic.  Eyes: Conjunctivae and EOM are normal. Pupils are equal,  round, and reactive to light. No scleral icterus.  Musculoskeletal: Normal range of motion. He exhibits tenderness. He exhibits no edema or deformity.       Right foot: There is tenderness and bony tenderness. There is normal range of motion, no swelling, normal capillary refill, no crepitus, no deformity and no laceration.  R foot- very mild tenderness over 4th metatarsal  No plantar tenderness or skin changes  No swelling/ecchymosis or limited rom  Nl rom ankle as well  Nl perfusion and sensation   Mild pes planus  He favors L foot slightly with walking   Neurological: He is alert. He has normal reflexes. He displays no atrophy. No sensory deficit. He exhibits normal muscle tone.  Skin: Skin is warm and dry. No rash noted. No erythema. No pallor.  Psychiatric: He has a normal mood and affect.          Assessment & Plan:   Problem List Items Addressed This Visit      Other   Right foot pain    Dorsal lateral R foot-started after doing some arch strengthening exercises /also elliptical  No hx of fractures  Fairly nl exam but pt has pain with walking  Recommended ice/elevation frequently when not active Shoes at all times except for when not wt bearing  Continue orthotics (otc) Naproxen as tolerated bid with meals for a week  Xray today and then update Of note he has an [place] trip in [place] with a lot of walking planned       Relevant Orders   DG Foot Complete Right

## 2016-04-12 NOTE — Assessment & Plan Note (Signed)
Dorsal lateral R foot-started after doing some arch strengthening exercises /also elliptical  No hx of fractures  Fairly nl exam but pt has pain with walking  Recommended ice/elevation frequently when not active Shoes at all times except for when not wt bearing  Continue orthotics (otc) Naproxen as tolerated bid with meals for a week  Xray today and then update Of note he has an [place] trip in [place] with a lot of walking planned

## 2016-04-20 ENCOUNTER — Ambulatory Visit (INDEPENDENT_AMBULATORY_CARE_PROVIDER_SITE_OTHER): Payer: Medicare Other | Admitting: Primary Care

## 2016-04-20 ENCOUNTER — Encounter: Payer: Self-pay | Admitting: Primary Care

## 2016-04-20 VITALS — BP 146/92 | HR 78 | Temp 98.1°F | Ht 72.25 in | Wt 218.4 lb

## 2016-04-20 DIAGNOSIS — R6889 Other general symptoms and signs: Secondary | ICD-10-CM | POA: Diagnosis not present

## 2016-04-20 NOTE — Patient Instructions (Signed)
Your symptoms are representative of a viral illness which will resolve on its own over time. Our goal is to treat your symptoms in order to aid your body in the healing process and to make you more comfortable.   Nasal Congestion/Ear Pressure: Try using Flonase (fluticasone) nasal spray. Instill 1 spray in each nostril twice daily.   Cough/Congestion: Try taking Mucinex DM. This will help loosen up the mucous in your chest. Ensure you take this medication with a full glass of water.  Cough without Congestion: Robitussin or Delsym.   Runny Nose, Itchy/Watery Eyes: Try Claritin.   Please notify me if you develop persistent fevers of 101, start coughing up green mucous, notice increased fatigue or weakness, or feel worse after 1 week of onset of symptoms.   Increase consumption of water intake and rest.  It was a pleasure meeting you!

## 2016-04-20 NOTE — Progress Notes (Signed)
Subjective:    Patient ID: Darren [name], male    DOB: 1945/11/05, 71 y.o.   MRN: [id number]  HPI  Mr. Gluth is a 71 year old male who presents today with a chief complaint of chills. He also reports body aches, nasal congestion, headache, dizziness. His symptoms began at 9 pm last night. He denies cough, sick contacts, shortness of breath, nausea. He took aspirin which helped him to sleep.   Review of Systems  Constitutional: Positive for fatigue.  HENT: Positive for congestion and sore throat. Negative for ear pain, postnasal drip and sinus pressure.   Respiratory: Negative for cough and shortness of breath.   Musculoskeletal: Positive for myalgias.  Neurological: Positive for headaches.       Past Medical History:  Diagnosis Date  . Basal cell carcinoma   . GERD (gastroesophageal reflux disease)   . History of colon polyps   . Hypertrophy of prostate without urinary obstruction and other lower urinary tract symptoms (LUTS)   . Leukocytopenia, unspecified   . Other and unspecified hyperlipidemia   . Overactive bladder      Social History   Social History  . Marital status: Married    Spouse name: N/A  . Number of children: 1  . Years of education: N/A   Occupational History  . retired college professor    Social History Main Topics  . Smoking status: Former Smoker    Quit date: 01/24/1968  . Smokeless tobacco: Never Used  . Alcohol use 0.0 oz/week     Comment: 2 1/2 per day  . Drug use: No  . Sexual activity: Yes   Other Topics Concern  . Not on file   Social History Narrative  . No narrative on file    Past Surgical History:  Procedure Laterality Date  . PROSTATE SURGERY  08/2012   TURP  . ROTATOR CUFF REPAIR Right 2003    Family History  Problem Relation Age of Onset  . Heart attack Father   . Breast cancer Mother   . Thyroid cancer Maternal Grandmother   . Colon cancer Neg Hx   . Esophageal cancer Neg Hx   . Rectal cancer Neg Hx   .  Stomach cancer Neg Hx     Allergies  Allergen Reactions  . Penicillins     REACTION: allergy as a child    Current Outpatient Prescriptions on File Prior to Visit  Medication Sig Dispense Refill  . aspirin EC 81 MG tablet Take 81 mg by mouth daily.    . cholecalciferol (VITAMIN D) 1000 units tablet Take 1,000 Units by mouth daily.    Marland Kitchen CIALIS 5 MG tablet Take 1 tablet by mouth daily.  1  . famotidine (PEPCID) 20 MG tablet Take 40 mg by mouth at bedtime.     Marland Kitchen MYRBETRIQ 50 MG TB24 tablet Take 50 mg by mouth daily.  0  . omeprazole (PRILOSEC OTC) 20 MG tablet Take 20 mg by mouth every morning.     Marland Kitchen ALPRAZolam (XANAX) 0.5 MG tablet Take 1 by mouth up to twice daily for airplane flight (Patient not taking: Reported on 04/20/2016) 15 tablet 0   No current facility-administered medications on file prior to visit.     BP (!) 146/92   Pulse 78   Temp 98.1 F (36.7 C) (Oral)   Ht 6' 0.25" (1.835 m)   Wt 218 lb 6.4 oz (99.1 kg)   SpO2 99%   BMI 29.42 kg/m  Objective:   Physical Exam  Constitutional: He appears well-nourished.  HENT:  Right Ear: Tympanic membrane and ear canal normal.  Left Ear: Tympanic membrane and ear canal normal.  Nose: No mucosal edema. Right sinus exhibits no maxillary sinus tenderness and no frontal sinus tenderness. Left sinus exhibits no maxillary sinus tenderness and no frontal sinus tenderness.  Mouth/Throat: Oropharynx is clear and moist.  Eyes: Conjunctivae are normal.  Neck: Neck supple.  Cardiovascular: Normal rate and regular rhythm.   Pulmonary/Chest: Effort normal and breath sounds normal. He has no wheezes. He has no rales.  Skin: Skin is warm and dry.          Assessment & Plan:  Viral-like Symptoms:  Chills, body aches, fatigue, headache x 12 hours. Improvement with Aspirin. Overall still feels fatigued. Exam today unremarkable, does not appear ill, vitals normal. Suspect beginning of viral URI and will treat with conservative  measures. Discussed that symptoms may get worse before better. Discussed several OTC options for potential symptoms. Follow up PRN.  Sheral Flow, NP

## 2016-04-20 NOTE — Progress Notes (Signed)
Pre visit review using our clinic review tool, if applicable. No additional management support is needed unless otherwise documented below in the visit note. 

## 2016-05-02 ENCOUNTER — Telehealth: Payer: Self-pay

## 2016-05-02 NOTE — Telephone Encounter (Signed)
Pt wanted to know if could get the new shingles vaccine; advised pt to ck with ins co to verify coverage for shingrix and can call our office back in few weeks to see if scheduling for shingrix or can get at pharmacy such as [facility] and [facility] now with rx. Pt will cb.

## 2016-05-24 ENCOUNTER — Encounter: Payer: Self-pay | Admitting: Family Medicine

## 2016-05-24 ENCOUNTER — Ambulatory Visit (INDEPENDENT_AMBULATORY_CARE_PROVIDER_SITE_OTHER): Payer: Medicare Other | Admitting: Family Medicine

## 2016-05-24 VITALS — BP 149/87 | HR 58 | Temp 98.2°F | Ht 72.25 in | Wt 214.5 lb

## 2016-05-24 DIAGNOSIS — M79671 Pain in right foot: Secondary | ICD-10-CM | POA: Diagnosis not present

## 2016-05-24 DIAGNOSIS — M7741 Metatarsalgia, right foot: Secondary | ICD-10-CM

## 2016-05-24 NOTE — Progress Notes (Signed)
Dr. Frederico Hamman T. Yenni Carra, MD, [facility] [place], [address] Phone: [phone] Fax: [phone]  05/24/2016  Patient: Darren [name], MRN: [id number], DOB: May 13, 1945, 71 y.o.  Primary Physician:  Loura Pardon, MD   Chief Complaint  Patient presents with  . Foot Pain    Right   Subjective:   Darren [name] is a 71 y.o. very pleasant male patient who presents with the following:  Was at the gym, several weeks ago, and had some pain in the dorsum in his right foot. Waited long enough to fix it and had some normal x-rays.   Has been doing some basic conservative steps. Did improve, but not completely clear up. It is better but not 100%.   He has now mostly improved, he still has some pain in the mid foot region around the TMT joints after his exercise some.  Past Medical History, Surgical History, Social History, Family History, Problem List, Medications, and Allergies have been reviewed and updated if relevant.  Patient Active Problem List   Diagnosis Date Noted  . Right foot pain 04/12/2016  . Back pain of thoracolumbar region 08/16/2015  . Encounter for Medicare annual wellness exam 08/18/2014  . Routine general medical examination at a health care facility 08/10/2014  . Leukopenia 06/12/2011  . DYSPEPSIA&OTHER [place] DISORDERS FUNCTION STOMACH 09/21/2009  . PERSONAL HISTORY OF COLONIC POLYPS 09/21/2009  . Hyperlipidemia 12/02/2008  . GERD 12/02/2008  . ANXIETY DISORDER, SITUATIONAL, MILD 04/03/2008  . BPH (benign prostatic hyperplasia) 10/22/2006    Past Medical History:  Diagnosis Date  . Basal cell carcinoma   . GERD (gastroesophageal reflux disease)   . History of colon polyps   . Hypertrophy of prostate without urinary obstruction and other lower urinary tract symptoms (LUTS)   . Leukocytopenia, unspecified   . Other and unspecified hyperlipidemia   . Overactive bladder     Past Surgical  History:  Procedure Laterality Date  . PROSTATE SURGERY  08/2012   TURP  . ROTATOR CUFF REPAIR Right 2003    Social History   Social History  . Marital status: Married    Spouse name: N/A  . Number of children: 1  . Years of education: N/A   Occupational History  . retired college professor    Social History Main Topics  . Smoking status: Former Smoker    Quit date: 01/24/1968  . Smokeless tobacco: Never Used  . Alcohol use 0.0 oz/week     Comment: 2 1/2 per day  . Drug use: No  . Sexual activity: Yes   Other Topics Concern  . Not on file   Social History Narrative  . No narrative on file    Family History  Problem Relation Age of Onset  . Heart attack Father   . Breast cancer Mother   . Thyroid cancer Maternal Grandmother   . Colon cancer Neg Hx   . Esophageal cancer Neg Hx   . Rectal cancer Neg Hx   . Stomach cancer Neg Hx     Allergies  Allergen Reactions  . Penicillins     REACTION: allergy as a child    Medication list reviewed and updated in full in [place].  GEN: No fevers, chills. Nontoxic. Primarily MSK c/o today. MSK: Detailed in the HPI GI: tolerating PO intake without difficulty Neuro: No numbness, parasthesias, or tingling associated. Otherwise the pertinent positives of the ROS are noted above.  Objective:   BP 138/80   Pulse 65   Temp 98.2 F (36.8 C) (Oral)   Ht 6' 0.25" (1.835 m)   Wt 214 lb 8 oz (97.3 kg)   BMI 28.89 kg/m    GEN: WDWN, NAD, Non-toxic, Alert & Oriented x 3 HEENT: Atraumatic, Normocephalic.  Ears and Nose: No external deformity. EXTR: No clubbing/cyanosis/edema NEURO: Normal gait.  PSYCH: Normally interactive. Conversant. Not depressed or anxious appearing.  Calm demeanor.    Nontender at toes bilaterally. Right side the patient does have a prominent bunion and bunionette formation. He does have metatarsal heads that her drop 2 through 4 also with prominent callus formation.  Great toe motion is  quite good and normal. Does have some pain at the TMT joints approximately at the third and fourth. No pain at the posterior tibialis and peroneal tendons. Noted pain at the Achilles tendon. No pain at the talus. Anterior drawer in subtalar tilt are normal  Radiology: Dg Foot Complete Right  Result Date: 04/12/2016 CLINICAL DATA:  Foot pain after working out. EXAM: RIGHT FOOT COMPLETE - 3+ VIEW COMPARISON:  No recent prior . FINDINGS: Diffuse degenerative change. Degenerative changes most prominent first metatarsophalangeal joint . No evidence of fracture or dislocation. IMPRESSION: DJD.  No acute abnormality . Electronically Signed   By: Marcello Moores  Register   On: 04/12/2016 12:33   Assessment and Plan:   Metatarsalgia of right foot  Foot pain, right  Mostly improved TMT joint aggravation in the mid foot most likely with some degree of metatarsalgia additionally. I gave the patient a metatarsal bar and placed this on his insole for additional support. Also gave him an arch binder to use when he is active and walking.  Follow-up: No Follow-up on file.  Medications Discontinued During This Encounter  Medication Reason  . CIALIS 5 MG tablet Completed Course    Signed,  Frederico Hamman T. Jaylnn Ullery, MD   Allergies as of 05/24/2016      Reactions   Penicillins    REACTION: allergy as a child      Medication List       Accurate as of 05/24/16  2:03 PM. Always use your most recent med list.          ALPRAZolam 0.5 MG tablet Commonly known as:  XANAX Take 1 by mouth up to twice daily for airplane flight   aspirin EC 81 MG tablet Take 81 mg by mouth daily.   cholecalciferol 1000 units tablet Commonly known as:  VITAMIN D Take 1,000 Units by mouth daily.   famotidine 20 MG tablet Commonly known as:  PEPCID Take 40 mg by mouth at bedtime.   MYRBETRIQ 50 MG Tb24 tablet Generic drug:  mirabegron ER Take 50 mg by mouth daily.   omeprazole 20 MG tablet Commonly known as:  PRILOSEC  OTC Take 20 mg by mouth every morning.

## 2016-05-24 NOTE — Progress Notes (Signed)
Pre visit review using our clinic review tool, if applicable. No additional management support is needed unless otherwise documented below in the visit note. 

## 2016-07-25 ENCOUNTER — Ambulatory Visit (INDEPENDENT_AMBULATORY_CARE_PROVIDER_SITE_OTHER): Payer: Medicare Other | Admitting: Family Medicine

## 2016-07-25 ENCOUNTER — Encounter: Payer: Self-pay | Admitting: Family Medicine

## 2016-07-25 VITALS — BP 140/70 | HR 65 | Temp 97.8°F | Ht 72.25 in | Wt 212.0 lb

## 2016-07-25 DIAGNOSIS — H6982 Other specified disorders of Eustachian tube, left ear: Secondary | ICD-10-CM

## 2016-07-25 MED ORDER — FLUTICASONE PROPIONATE 50 MCG/ACT NA SUSP: 2.0000 | Freq: Every day | NASAL | 2 refills | Status: DC

## 2016-07-25 NOTE — Patient Instructions (Addendum)
I think you may have eustachian tube dysfunction  Try the flonase nasal spray - 2 sprays in each nostril twice daily for 2 days and then once daily for 2-4 weeks If no improvement in 2 weeks -please alert [place]   Update if symptoms worsen or you develop fever or other symptoms

## 2016-07-25 NOTE — Progress Notes (Signed)
Subjective:    Patient ID: Darren [name], male    DOB: 04-02-1945, 71 y.o.   MRN: [id number]  HPI  Here for an ear  C/o L ear discomfort for 2 weeks  Noticed it was sore when he lay on that side  Unsure if it was inside ear or in front of it  No trauma to ear that he knows of   He took some benadryl nightly for a few nights  It did improve Still bothers him a little bit  Yesterday a glob of way came out of it   Thinks generally hearing is better in R ear for some time   L nostril stops up more than the R one   No purulent nasal d/c  No hx of allergies  No fever or other symptoms  No redness or swelling of ear itself   Patient Active Problem List   Diagnosis Date Noted  . ETD (Eustachian tube dysfunction), left 07/25/2016  . Right foot pain 04/12/2016  . Back pain of thoracolumbar region 08/16/2015  . Encounter for Medicare annual wellness exam 08/18/2014  . Routine general medical examination at a health care facility 08/10/2014  . Leukopenia 06/12/2011  . DYSPEPSIA&OTHER [place] DISORDERS FUNCTION STOMACH 09/21/2009  . PERSONAL HISTORY OF COLONIC POLYPS 09/21/2009  . Hyperlipidemia 12/02/2008  . GERD 12/02/2008  . ANXIETY DISORDER, SITUATIONAL, MILD 04/03/2008  . BPH (benign prostatic hyperplasia) 10/22/2006   Past Medical History:  Diagnosis Date  . Basal cell carcinoma   . GERD (gastroesophageal reflux disease)   . History of colon polyps   . Hypertrophy of prostate without urinary obstruction and other lower urinary tract symptoms (LUTS)   . Leukocytopenia, unspecified   . Other and unspecified hyperlipidemia   . Overactive bladder    Past Surgical History:  Procedure Laterality Date  . PROSTATE SURGERY  08/2012   TURP  . ROTATOR CUFF REPAIR Right 2003   Social History  Substance Use Topics  . Smoking status: Former Smoker    Quit date: 01/24/1968  . Smokeless tobacco: Never Used  . Alcohol use 0.0 oz/week     Comment: 2 1/2 per day   Family  History  Problem Relation Age of Onset  . Heart attack Father   . Breast cancer Mother   . Thyroid cancer Maternal Grandmother   . Colon cancer Neg Hx   . Esophageal cancer Neg Hx   . Rectal cancer Neg Hx   . Stomach cancer Neg Hx    Allergies  Allergen Reactions  . Penicillins     REACTION: allergy as a child   Current Outpatient Prescriptions on File Prior to Visit  Medication Sig Dispense Refill  . ALPRAZolam (XANAX) 0.5 MG tablet Take 1 by mouth up to twice daily for airplane flight 15 tablet 0  . aspirin EC 81 MG tablet Take 81 mg by mouth daily.    . cholecalciferol (VITAMIN D) 1000 units tablet Take 1,000 Units by mouth daily.    . famotidine (PEPCID) 20 MG tablet Take 40 mg by mouth at bedtime.     Marland Kitchen MYRBETRIQ 50 MG TB24 tablet Take 50 mg by mouth daily.  0  . omeprazole (PRILOSEC OTC) 20 MG tablet Take 20 mg by mouth every morning.      No current facility-administered medications on file prior to visit.     Review of Systems    Review of Systems  Constitutional: Negative for fever, appetite change, fatigue and unexpected weight  change.  Eyes: Negative for pain and visual disturbance.  ENT pos for ear discomfort/neg for sinus pain or pnd Respiratory: Negative for cough and shortness of breath.   Cardiovascular: Negative for cp or palpitations    Gastrointestinal: Negative for nausea, diarrhea and constipation.  Genitourinary: Negative for urgency and frequency.  Skin: Negative for pallor or rash   Neurological: Negative for weakness, light-headedness, numbness and headaches.  Hematological: Negative for adenopathy. Does not bruise/bleed easily.  Psychiatric/Behavioral: Negative for dysphoric mood. The patient is not nervous/anxious.      Objective:   Physical Exam  Constitutional: He appears well-developed and well-nourished. No distress.  Well appearing   Pt talks loudly  HENT:  Head: Normocephalic and atraumatic.  Mouth/Throat: Oropharynx is clear and  moist. No oropharyngeal exudate.  Nares are boggy  L TM dull / some scarring R TM slt retracted No erythema or bulging No sinus tenderness  Eyes: Conjunctivae and EOM are normal. Pupils are equal, round, and reactive to light. Right eye exhibits no discharge. Left eye exhibits no discharge. No scleral icterus.  Neck: Normal range of motion. Neck supple.  Cardiovascular: Normal rate and regular rhythm.   Pulmonary/Chest: Breath sounds normal. No respiratory distress. He has no wheezes. He has no rales.  Lymphadenopathy:    He has no cervical adenopathy.  Neurological: He is alert. No cranial nerve deficit.  Skin: Skin is warm and dry. No rash noted.  Psychiatric: He has a normal mood and affect.          Assessment & Plan:   Problem List Items Addressed This Visit      Nervous and Auditory   ETD (Eustachian tube dysfunction), left    Improved but not resolved - L ear  flonase 2 sprays per nostril bid for 2 days, then once daily for 2-4 weeks  If no improvement- consider ENT ref  If worse or pain- will alert [place]

## 2016-07-26 NOTE — Assessment & Plan Note (Signed)
Improved but not resolved - L ear  flonase 2 sprays per nostril bid for 2 days, then once daily for 2-4 weeks  If no improvement- consider ENT ref  If worse or pain- will alert [place]

## 2016-09-13 ENCOUNTER — Telehealth: Payer: Self-pay | Admitting: Family Medicine

## 2016-09-13 ENCOUNTER — Other Ambulatory Visit (INDEPENDENT_AMBULATORY_CARE_PROVIDER_SITE_OTHER): Payer: Medicare Other

## 2016-09-13 DIAGNOSIS — E7800 Pure hypercholesterolemia, unspecified: Secondary | ICD-10-CM

## 2016-09-13 DIAGNOSIS — Z0000 Encounter for general adult medical examination without abnormal findings: Secondary | ICD-10-CM

## 2016-09-13 DIAGNOSIS — N400 Enlarged prostate without lower urinary tract symptoms: Secondary | ICD-10-CM | POA: Diagnosis not present

## 2016-09-13 LAB — CBC WITH DIFFERENTIAL/PLATELET
Basophils Absolute: 0 10*3/uL (ref 0.0–0.1)
Basophils Relative: 1 % (ref 0.0–3.0)
EOS PCT: 2.5 % (ref 0.0–5.0)
Eosinophils Absolute: 0.1 10*3/uL (ref 0.0–0.7)
HCT: 41.9 % (ref 39.0–52.0)
Hemoglobin: 14.1 g/dL (ref 13.0–17.0)
LYMPHS ABS: 1.2 10*3/uL (ref 0.7–4.0)
LYMPHS PCT: 29 % (ref 12.0–46.0)
MCHC: 33.6 g/dL (ref 30.0–36.0)
MCV: 100.9 fl — AB (ref 78.0–100.0)
MONOS PCT: 12.7 % — AB (ref 3.0–12.0)
Monocytes Absolute: 0.5 10*3/uL (ref 0.1–1.0)
NEUTROS ABS: 2.2 10*3/uL (ref 1.4–7.7)
NEUTROS PCT: 54.8 % (ref 43.0–77.0)
PLATELETS: 157 10*3/uL (ref 150.0–400.0)
RBC: 4.15 Mil/uL — ABNORMAL LOW (ref 4.22–5.81)
RDW: 12.6 % (ref 11.5–15.5)
WBC: 4.1 10*3/uL (ref 4.0–10.5)

## 2016-09-13 LAB — LIPID PANEL
CHOLESTEROL: 211 mg/dL — AB (ref 0–200)
HDL: 57.5 mg/dL (ref >39.00)
LDL Cholesterol: 141 mg/dL — ABNORMAL HIGH (ref 0–99)
NonHDL: 153.92
TRIGLYCERIDES: 63 mg/dL (ref 0.0–149.0)
Total CHOL/HDL Ratio: 4
VLDL: 12.6 mg/dL (ref 0.0–40.0)

## 2016-09-13 LAB — COMPREHENSIVE METABOLIC PANEL
ALT: 18 U/L (ref 0–53)
AST: 16 U/L (ref 0–37)
Albumin: 4 g/dL (ref 3.5–5.2)
Alkaline Phosphatase: 38 U/L — ABNORMAL LOW (ref 39–117)
BILIRUBIN TOTAL: 0.5 mg/dL (ref 0.2–1.2)
BUN: 15 mg/dL (ref 6–23)
CO2: 31 meq/L (ref 19–32)
Calcium: 9.3 mg/dL (ref 8.4–10.5)
Chloride: 106 mEq/L (ref 96–112)
Creatinine, Ser: 0.98 mg/dL (ref 0.40–1.50)
GFR: 80 mL/min (ref >60.00)
Glucose, Bld: 103 mg/dL — ABNORMAL HIGH (ref 70–99)
Potassium: 4.1 mEq/L (ref 3.5–5.1)
Sodium: 140 mEq/L (ref 135–145)
Total Protein: 6.1 g/dL (ref 6.0–8.3)

## 2016-09-13 LAB — PSA, MEDICARE: PSA: 0.56 ng/ml (ref 0.10–4.00)

## 2016-09-13 LAB — TSH: TSH: 2.75 u[IU]/mL (ref 0.35–4.50)

## 2016-09-13 NOTE — Telephone Encounter (Signed)
-----   Message from Marchia Bond sent at 08/31/2016  3:12 PM EDT ----- Regarding: Cpx labs Wed 8/22, need orders. Thanks :-) Please order  future cpx labs for pt's upcoming lab appt. Thanks Aniceto Boss

## 2016-09-19 ENCOUNTER — Ambulatory Visit (INDEPENDENT_AMBULATORY_CARE_PROVIDER_SITE_OTHER): Payer: Medicare Other | Admitting: Family Medicine

## 2016-09-19 ENCOUNTER — Encounter: Payer: Self-pay | Admitting: Family Medicine

## 2016-09-19 VITALS — BP 116/68 | HR 66 | Temp 98.2°F | Ht 72.0 in | Wt 212.8 lb

## 2016-09-19 DIAGNOSIS — F4322 Adjustment disorder with anxiety: Secondary | ICD-10-CM

## 2016-09-19 DIAGNOSIS — N400 Enlarged prostate without lower urinary tract symptoms: Secondary | ICD-10-CM

## 2016-09-19 DIAGNOSIS — M79671 Pain in right foot: Secondary | ICD-10-CM

## 2016-09-19 DIAGNOSIS — Z0000 Encounter for general adult medical examination without abnormal findings: Secondary | ICD-10-CM | POA: Diagnosis not present

## 2016-09-19 DIAGNOSIS — E7800 Pure hypercholesterolemia, unspecified: Secondary | ICD-10-CM

## 2016-09-19 DIAGNOSIS — R7309 Other abnormal glucose: Secondary | ICD-10-CM | POA: Diagnosis not present

## 2016-09-19 DIAGNOSIS — R0981 Nasal congestion: Secondary | ICD-10-CM | POA: Insufficient documentation

## 2016-09-19 MED ORDER — FLUTICASONE PROPIONATE 50 MCG/ACT NA SUSP: 2.0000 | Freq: Every day | NASAL | 11 refills | Status: DC

## 2016-09-19 MED ORDER — ALPRAZOLAM 0.5 MG PO TABS: ORAL_TABLET | ORAL | 0 refills | Status: AC

## 2016-09-19 NOTE — Patient Instructions (Addendum)
Don't forget to get your flu shot in the fall   Avoid red meat/ fried foods/ egg yolks/ fatty breakfast meats/ butter, cheese and high fat dairy/ and shellfish   See the handout   Your blood sugar is high normal - we will watch this  Try to get most of your carbohydrates from produce (with the exception of white potatoes)  Eat less bread/pasta/rice/snack foods/cereals/sweets and other items from the middle of the grocery store (processed carbs)   Please bring a copy of your advanced directive the next time you are here   We will refer you to orthopedics for your foot  Also ENT for your chronic congestion   Take care of yourself

## 2016-09-19 NOTE — Assessment & Plan Note (Signed)
Continues tx from urology w/o change  Has yearly f/u for DRE Lab Results  Component Value Date   PSA 0.56 09/13/2016   PSA 0.58 09/13/2015   PSA 1.99 08/11/2014   Re assuring

## 2016-09-19 NOTE — Progress Notes (Signed)
Subjective:    Patient ID: Darren [name], male    DOB: [birth year], 71 y.o.   MRN: [id number]  HPI  Here for health maintenance exam and to review chronic medical problems    Feeling well overall   Dep screen neg   Wt Readings from Last 3 Encounters:  09/19/16 212 lb 12 oz (96.5 kg)  07/25/16 212 lb (96.2 kg)  05/24/16 214 lb 8 oz (97.3 kg)  stable  Not as much exercise -traveling a lot / getting back to normal now  Did walk a lot on one trip  Eating healthier when he is at home  28.85 kg/m  Flu shot-will get in the fall   Td 11/10  zostavax 9/07= interested in the shingrix vaccine when avail  Had both PNA vaccines   colonoscopy 2/17-recall 10 y (but likely not due to age )   Prostate screen Lab Results  Component Value Date   PSA 0.56 09/13/2016   PSA 0.58 09/13/2015   PSA 1.99 08/11/2014  hx of TURP for BPH Takes  myrbetriq Urology-sees every march  No change in symptoms   Hyperlipidemia Lab Results  Component Value Date   CHOL 211 (H) 09/13/2016   CHOL 203 (H) 09/13/2015   CHOL 235 (H) 08/11/2014   Lab Results  Component Value Date   HDL 57.50 09/13/2016   HDL 63.70 09/13/2015   HDL 67.50 08/11/2014   Lab Results  Component Value Date   LDLCALC 141 (H) 09/13/2016   LDLCALC 128 (H) 09/13/2015   LDLCALC 152 (H) 08/11/2014   Lab Results  Component Value Date   TRIG 63.0 09/13/2016   TRIG 57.0 09/13/2015   TRIG 78.0 08/11/2014   Lab Results  Component Value Date   CHOLHDL 4 09/13/2016   CHOLHDL 3 09/13/2015   CHOLHDL 3 08/11/2014   Lab Results  Component Value Date   LDLDIRECT 127.7 02/15/2009   LDLDIRECT 144.2 12/02/2008   LDLDIRECT 134.0 03/21/2007   Favorable lipo science in the past  Diet controlled  Wants to continue to watch this  occ eats fried okra (once per week)  Eats too much cheese  Shellfish   Glucose was 103 fasting   Lab Results  Component Value Date   CREATININE 0.98 09/13/2016   BUN 15 09/13/2016   NA  140 09/13/2016   K 4.1 09/13/2016   CL 106 09/13/2016   CO2 31 09/13/2016   Lab Results  Component Value Date   ALT 18 09/13/2016   AST 16 09/13/2016   ALKPHOS 38 (L) 09/13/2016   BILITOT 0.5 09/13/2016   Lab Results  Component Value Date   WBC 4.1 09/13/2016   HGB 14.1 09/13/2016   HCT 41.9 09/13/2016   MCV 100.9 (H) 09/13/2016   PLT 157.0 09/13/2016   Lab Results  Component Value Date   TSH 2.75 09/13/2016     Advanced directive -has a living will and poa (? If we have a copy)   Fall hx -none this year   Memory - occ forgets a name    BP Readings from Last 3 Encounters:  09/19/16 116/68  07/25/16 140/70  05/24/16 (!) 149/87   Pulse Readings from Last 3 Encounters:  09/19/16 66  07/25/16 65  05/24/16 (!) 58   Due for a xanax px- does not use it often at all   Right foot still hurts a lot  Nothing helped Would like ortho ref   L nostril is still inflammed  Ears  feel ok now  Using nasal spray/ flonase    Patient Active Problem List   Diagnosis Date Noted  . Elevated glucose level 09/19/2016  . Nasal congestion 09/19/2016  . Right foot pain 04/12/2016  . Encounter for Medicare annual wellness exam 08/18/2014  . Routine general medical examination at a health care facility 08/10/2014  . PERSONAL HISTORY OF COLONIC POLYPS 09/21/2009  . Hyperlipidemia 12/02/2008  . GERD 12/02/2008  . ANXIETY DISORDER, SITUATIONAL, MILD 04/03/2008  . BPH (benign prostatic hyperplasia) 10/22/2006   Past Medical History:  Diagnosis Date  . Basal cell carcinoma   . GERD (gastroesophageal reflux disease)   . History of colon polyps   . Hypertrophy of prostate without urinary obstruction and other lower urinary tract symptoms (LUTS)   . Leukocytopenia, unspecified   . Other and unspecified hyperlipidemia   . Overactive bladder    Past Surgical History:  Procedure Laterality Date  . PROSTATE SURGERY  08/2012   TURP  . ROTATOR CUFF REPAIR Right 2003   Social History   Substance Use Topics  . Smoking status: Former Smoker    Quit date: 01/24/1968  . Smokeless tobacco: Never Used  . Alcohol use 0.0 oz/week     Comment: 2 1/2 per day   Family History  Problem Relation Age of Onset  . Heart attack Father   . Breast cancer Mother   . Thyroid cancer Maternal Grandmother   . Colon cancer Neg Hx   . Esophageal cancer Neg Hx   . Rectal cancer Neg Hx   . Stomach cancer Neg Hx    Allergies  Allergen Reactions  . Penicillins     REACTION: allergy as a child   Current Outpatient Prescriptions on File Prior to Visit  Medication Sig Dispense Refill  . aspirin EC 81 MG tablet Take 81 mg by mouth daily.    . cholecalciferol (VITAMIN D) 1000 units tablet Take 1,000 Units by mouth daily.    . famotidine (PEPCID) 20 MG tablet Take 40 mg by mouth at bedtime.     Marland Kitchen MYRBETRIQ 50 MG TB24 tablet Take 50 mg by mouth daily.  0  . omeprazole (PRILOSEC OTC) 20 MG tablet Take 20 mg by mouth every morning.      No current facility-administered medications on file prior to visit.     Review of Systems Review of Systems  Constitutional: Negative for fever, appetite change, fatigue and unexpected weight change.  Eyes: Negative for pain and visual disturbance.  ENT pos for chronic L nostril congestion  Respiratory: Negative for cough and shortness of breath.   Cardiovascular: Negative for cp or palpitations    Gastrointestinal: Negative for nausea, diarrhea and constipation.  Genitourinary: Negative for urgency and frequency.  Skin: Negative for pallor or rash   MSK pos for ongoing foot pain  Neurological: Negative for weakness, light-headedness, numbness and headaches.  Hematological: Negative for adenopathy. Does not bruise/bleed easily.  Psychiatric/Behavioral: Negative for dysphoric mood. The patient is not nervous/anxious.         Objective:   Physical Exam  Constitutional: He appears well-developed and well-nourished. No distress.  overwt and well app    HENT:  Head: Normocephalic and atraumatic.  Right Ear: External ear normal.  Left Ear: External ear normal.  Nose: Nose normal.  Mouth/Throat: Oropharynx is clear and moist.  L nare is congested /injected  No sinus tenderness  Eyes: Pupils are equal, round, and reactive to light. Conjunctivae and EOM are normal. Right eye  exhibits no discharge. Left eye exhibits no discharge. No scleral icterus.  Neck: Normal range of motion. Neck supple. No JVD present. Carotid bruit is not present. No thyromegaly present.  Cardiovascular: Normal rate, regular rhythm, normal heart sounds and intact distal pulses.  Exam reveals no gallop.   Pulmonary/Chest: Effort normal and breath sounds normal. No respiratory distress. He has no wheezes. He exhibits no tenderness.  Abdominal: Soft. Bowel sounds are normal. He exhibits no distension, no abdominal bruit and no mass. There is no tenderness.  Musculoskeletal: He exhibits no edema, tenderness or deformity.  No deformity of either foot   Lymphadenopathy:    He has no cervical adenopathy.  Neurological: He is alert. He has normal reflexes. No cranial nerve deficit. He exhibits normal muscle tone. Coordination normal.  Skin: Skin is warm and dry. No rash noted. No erythema. No pallor.  Few lentigines and sk  Fair complexion  Psychiatric: He has a normal mood and affect.          Assessment & Plan:   Problem List Items Addressed This Visit      Genitourinary   BPH (benign prostatic hyperplasia)    Continues tx from urology w/o change  Has yearly f/u for DRE Lab Results  Component Value Date   PSA 0.56 09/13/2016   PSA 0.58 09/13/2015   PSA 1.99 08/11/2014   Re assuring        Other   ANXIETY DISORDER, SITUATIONAL, MILD    Pt uses xanax occ -esp for travel  Refilled 15 with warning of sedation and habit      Elevated glucose level    103 fasting  Will watch this and check A1C next time  disc imp of low glycemic diet and wt loss to  prevent DM2       Hyperlipidemia    Disc goals for lipids and reasons to control them Rev labs with pt Rev low sat fat diet in detail LDL is up  Has had favorable lipo science in the past  Disc diet and handout given  Consider statin if it continues to rise      Nasal congestion    Chronic L sided  Not imp with steroid ns (though this did help ETD) Ref to ENT ? If anatomic problem      Relevant Orders   Ambulatory referral to ENT   Right foot pain    No imp after seeing sport med  req ortho ref  Ref done       Relevant Orders   Ambulatory referral to Orthopedic Surgery   Routine general medical examination at a health care facility - Primary    Reviewed health habits including diet and exercise and skin cancer prevention Reviewed appropriate screening tests for age  Also reviewed health mt list, fam hx and immunization status , as well as social and family history   See HPI Labs reviewed  Planning flu shot in the fall  Disc plan for diet for cholesterol/glucose Has advance directive and will bring a copy

## 2016-09-19 NOTE — Assessment & Plan Note (Signed)
Disc goals for lipids and reasons to control them Rev labs with pt Rev low sat fat diet in detail LDL is up  Has had favorable lipo science in the past  Disc diet and handout given  Consider statin if it continues to rise

## 2016-09-19 NOTE — Assessment & Plan Note (Signed)
Pt uses xanax occ -esp for travel  Refilled 15 with warning of sedation and habit

## 2016-09-19 NOTE — Assessment & Plan Note (Signed)
103 fasting  Will watch this and check A1C next time  disc imp of low glycemic diet and wt loss to prevent DM2

## 2016-09-19 NOTE — Assessment & Plan Note (Signed)
No imp after seeing sport med  req ortho ref  Ref done

## 2016-09-19 NOTE — Assessment & Plan Note (Signed)
Reviewed health habits including diet and exercise and skin cancer prevention Reviewed appropriate screening tests for age  Also reviewed health mt list, fam hx and immunization status , as well as social and family history   See HPI Labs reviewed  Planning flu shot in the fall  Disc plan for diet for cholesterol/glucose Has advance directive and will bring a copy

## 2016-09-19 NOTE — Assessment & Plan Note (Signed)
Chronic L sided  Not imp with steroid ns (though this did help ETD) Ref to ENT ? If anatomic problem

## 2016-11-10 ENCOUNTER — Ambulatory Visit (INDEPENDENT_AMBULATORY_CARE_PROVIDER_SITE_OTHER): Payer: Medicare Other

## 2016-11-10 DIAGNOSIS — Z23 Encounter for immunization: Secondary | ICD-10-CM

## 2017-03-30 ENCOUNTER — Encounter: Payer: Self-pay | Admitting: Gastroenterology

## 2017-03-30 ENCOUNTER — Ambulatory Visit: Payer: Medicare Other | Admitting: Gastroenterology

## 2017-03-30 VITALS — BP 116/70 | HR 72 | Ht 72.0 in | Wt 212.4 lb

## 2017-03-30 DIAGNOSIS — K219 Gastro-esophageal reflux disease without esophagitis: Secondary | ICD-10-CM | POA: Diagnosis not present

## 2017-03-30 NOTE — Patient Instructions (Addendum)
You will be set up for an upper endoscopy for GERD.   Please call when you are ready to schedule. For now, cut back on caffeine.  Normal BMI (Body Mass Index- based on height and weight) is between 23 and 30. Your BMI today is Body mass index is 28.8 kg/m. Marland Kitchen Please consider follow up  regarding your BMI with your Primary Care Provider.

## 2017-03-30 NOTE — Progress Notes (Signed)
Review of pertinent gastrointestinal problems:  1. GERD (nonerosive): 08/2013 PPI in AM, H2 blocker at bedtime and symptoms under very good control 2. nonobstructing Schatzki's ring, EGD 2010 not dilated due to lack of dysphasia; done for dyspepsia  3. colon polyps removed in 2003 and in 2006 by Lyla Son, not retrieved (unclear pathology). Was told to have next colonoscopy in 2012; Repeat colonoscopy Ardis Hughs) 10/2009; two small polyps, one was TA, recall recommended 5 year interval.  Repeat colonoscopy 02/2015 Dr. Ardis Hughs found left sided diverticulosis and small internal hemorrhoids. No polyps.     HPI: This is a very pleasant 72 year old man whom I last saw about 2 years ago the time of a colonoscopy.  See those results summarized above.  Taking OTC PPI in AM and H2 blcoker at bedtime.    More GERD issues in past 6-12 months:  Burning in substernum in January.  Took pepto several times.  He is needing some tums at night, most nights.  Has not really changed his evening routines.  Never eats within 3-4 hours of laying down  Overall stable weight.  Chief complaint is GERD.  ROS: complete GI ROS as described in HPI, all other review negative.  Constitutional:  No unintentional weight loss   Past Medical History:  Diagnosis Date  . Basal cell carcinoma   . GERD (gastroesophageal reflux disease)   . History of colon polyps   . Hypertrophy of prostate without urinary obstruction and other lower urinary tract symptoms (LUTS)   . Leukocytopenia, unspecified   . Other and unspecified hyperlipidemia   . Overactive bladder     Past Surgical History:  Procedure Laterality Date  . PROSTATE SURGERY  08/2012   TURP  . ROTATOR CUFF REPAIR Right 2003    Current Outpatient Medications  Medication Sig Dispense Refill  . ALPRAZolam (XANAX) 0.5 MG tablet Take 1 by mouth up to twice daily for airplane flight 15 tablet 0  . bismuth subsalicylate (PEPTO BISMOL) 262 MG/15ML suspension Take 30  mLs by mouth as needed.    . calcium carbonate (TUMS EX) 750 MG chewable tablet Chew 2-3 tablets by mouth at bedtime as needed for heartburn.    . famotidine (PEPCID) 20 MG tablet Take 40 mg by mouth at bedtime.     . Methylcellulose, Laxative, (CITRUCEL PO) Take 2 tablets by mouth daily.    Marland Kitchen MYRBETRIQ 50 MG TB24 tablet Take 50 mg by mouth daily.  0  . omeprazole (PRILOSEC OTC) 20 MG tablet Take 20 mg by mouth every morning.      No current facility-administered medications for this visit.     Allergies as of 03/30/2017 - Review Complete 03/30/2017  Allergen Reaction Noted  . Penicillins  10/22/2006    Family History  Problem Relation Age of Onset  . Heart attack Father   . Breast cancer Mother   . Thyroid cancer Maternal Grandmother   . Colon cancer Neg Hx   . Esophageal cancer Neg Hx   . Rectal cancer Neg Hx   . Stomach cancer Neg Hx     Social History   Socioeconomic History  . Marital status: Married    Spouse name: Not on file  . Number of children: 1  . Years of education: Not on file  . Highest education level: Not on file  Social Needs  . Financial resource strain: Not on file  . Food insecurity - worry: Not on file  . Food insecurity - inability: Not on file  .  Transportation needs - medical: Not on file  . Transportation needs - non-medical: Not on file  Occupational History  . Occupation: retired college professor  Tobacco Use  . Smoking status: Former Smoker    Last attempt to quit: 01/24/1968    Years since quitting: 49.2  . Smokeless tobacco: Never Used  Substance and Sexual Activity  . Alcohol use: Yes    Alcohol/week: 0.0 oz    Comment: 2 1/2 per day  . Drug use: No  . Sexual activity: Yes  Other Topics Concern  . Not on file  Social History Narrative  . Not on file     Physical Exam: BP 116/70 (BP Location: Left Arm, Patient Position: Sitting, Cuff Size: Normal)   Pulse 72   Ht 6' (1.829 m)   Wt 212 lb 6 oz (96.3 kg)   BMI 28.80 kg/m   Constitutional: generally well-appearing Psychiatric: alert and oriented x3 Abdomen: soft, nontender, nondistended, no obvious ascites, no peritoneal signs, normal bowel sounds No peripheral edema noted in lower extremities  Assessment and plan: 72 y.o. male with GERD  He is GERD symptoms have worsened despite continued OTC proton pump inhibitor in the morning and nightly H2 blocker at bedtime.  He has slightly increased his caffeine intake recently but this is usually before lunch time and his GERD symptoms have been worse at bedtime.  I recommended he try to cut back a bit on his caffeine intake and that we proceed with EGD at his soonest convenience.  Please see the "Patient Instructions" section for addition details about the plan.  Owens Loffler, MD [facility] 03/30/2017, 9:47 AM

## 2017-04-06 ENCOUNTER — Encounter: Payer: Self-pay | Admitting: Gastroenterology

## 2017-05-22 ENCOUNTER — Other Ambulatory Visit: Payer: Self-pay

## 2017-05-22 ENCOUNTER — Ambulatory Visit (AMBULATORY_SURGERY_CENTER): Payer: Self-pay | Admitting: *Deleted

## 2017-05-22 VITALS — Ht 72.0 in | Wt 212.0 lb

## 2017-05-22 DIAGNOSIS — K219 Gastro-esophageal reflux disease without esophagitis: Secondary | ICD-10-CM

## 2017-05-22 NOTE — Progress Notes (Signed)
Patient denies any allergies to egg or soy products. Patient denies complications with anesthesia/sedation.  Patient denies oxygen use at home and denies diet medications. Pamphlet given on endoscopy.  

## 2017-06-05 ENCOUNTER — Encounter: Payer: Self-pay | Admitting: Gastroenterology

## 2017-06-19 ENCOUNTER — Other Ambulatory Visit: Payer: Self-pay

## 2017-06-19 ENCOUNTER — Ambulatory Visit (AMBULATORY_SURGERY_CENTER): Payer: Medicare Other | Admitting: Gastroenterology

## 2017-06-19 ENCOUNTER — Encounter: Payer: Self-pay | Admitting: Gastroenterology

## 2017-06-19 VITALS — BP 131/75 | HR 54 | Temp 98.0°F | Resp 8 | Ht 72.0 in | Wt 212.0 lb

## 2017-06-19 DIAGNOSIS — K2100 Gastro-esophageal reflux disease with esophagitis, without bleeding: Secondary | ICD-10-CM

## 2017-06-19 DIAGNOSIS — K449 Diaphragmatic hernia without obstruction or gangrene: Secondary | ICD-10-CM | POA: Diagnosis not present

## 2017-06-19 MED ORDER — SODIUM CHLORIDE 0.9 % IV SOLN: 500.0000 mL | INTRAVENOUS | Status: DC

## 2017-06-19 NOTE — Op Note (Signed)
[facility] Patient Name: Darren [name] Procedure Date: 06/19/2017 9:54 AM MRN: [id number] Endoscopist: Milus Banister , MD Age: 72 Referring MD:  Date of Birth: 05-06-1945 Gender: Male Account #: [id number] Procedure:                Upper GI endoscopy Indications:              Heartburn Medicines:                Monitored Anesthesia Care Procedure:                Pre-Anesthesia Assessment:                           - Prior to the procedure, a History and Physical                            was performed, and patient medications and                            allergies were reviewed. The patient's tolerance of                            previous anesthesia was also reviewed. The risks                            and benefits of the procedure and the sedation                            options and risks were discussed with the patient.                            All questions were answered, and informed consent                            was obtained. Prior Anticoagulants: The patient has                            taken no previous anticoagulant or antiplatelet                            agents. ASA Grade Assessment: II - A patient with                            mild systemic disease. After reviewing the risks                            and benefits, the patient was deemed in                            satisfactory condition to undergo the procedure.                           After obtaining informed consent, the endoscope was  passed under direct vision. Throughout the                            procedure, the patient's blood pressure, pulse, and                            oxygen saturations were monitored continuously. The                            Endoscope was introduced through the mouth, and                            advanced to the second part of duodenum. The upper                            GI endoscopy was accomplished without  difficulty.                            The patient tolerated the procedure well. Scope In: Scope Out: Findings:                 A small hiatal hernia was present.                           The exam was otherwise without abnormality. Complications:            No immediate complications. Estimated blood loss:                            None. Estimated Blood Loss:     Estimated blood loss: none. Impression:               - Small hiatal hernia.                           - The examination was otherwise normal.                           - No specimens collected. Recommendation:           - Patient has a contact number available for                            emergencies. The signs and symptoms of potential                            delayed complications were discussed with the                            patient. Return to normal activities tomorrow.                            Written discharge instructions were provided to the                            patient.                           -  Resume previous diet.                           - Continue present medications. Milus Banister, MD 06/19/2017 10:04:05 AM This report has been signed electronically.

## 2017-06-19 NOTE — Progress Notes (Signed)
Spontaneous respirations throughout. VSS. Resting comfortably. To PACU on room air. Report to  RN. 

## 2017-06-19 NOTE — Patient Instructions (Signed)
YOU HAD AN ENDOSCOPIC PROCEDURE TODAY AT THE [facility]:   Refer to the procedure report that was given to you for any specific questions about what was found during the examination.  If the procedure report does not answer your questions, please call your gastroenterologist to clarify.  If you requested that your care partner not be given the details of your procedure findings, then the procedure report has been included in a sealed envelope for you to review at your convenience later.  YOU SHOULD EXPECT: Some feelings of bloating in the abdomen. Passage of more gas than usual.  Walking can help get rid of the air that was put into your GI tract during the procedure and reduce the bloating. If you had a lower endoscopy (such as a colonoscopy or flexible sigmoidoscopy) you may notice spotting of blood in your stool or on the toilet paper. If you underwent a bowel prep for your procedure, you may not have a normal bowel movement for a few days.  Please Note:  You might notice some irritation and congestion in your nose or some drainage.  This is from the oxygen used during your procedure.  There is no need for concern and it should clear up in a day or so.  SYMPTOMS TO REPORT IMMEDIATELY:    Following upper endoscopy (EGD)  Vomiting of blood or coffee ground material  New chest pain or pain under the shoulder blades  Painful or persistently difficult swallowing  New shortness of breath  Fever of 100F or higher  Black, tarry-looking stools  For urgent or emergent issues, a gastroenterologist can be reached at any hour by calling [phone].    DIET:  We do recommend a small meal at first, but then you may proceed to your regular diet.  Drink plenty of fluids but you should avoid alcoholic beverages for 24 hours.  ACTIVITY:  You should plan to take it easy for the rest of today and you should NOT DRIVE or use heavy machinery until tomorrow (because of the sedation medicines  used during the test).    FOLLOW UP: Our staff will call the number listed on your records the next business day following your procedure to check on you and address any questions or concerns that you may have regarding the information given to you following your procedure. If we do not reach you, we will leave a message.  However, if you are feeling well and you are not experiencing any problems, there is no need to return our call.  We will assume that you have returned to your regular daily activities without incident.  If any biopsies were taken you will be contacted by phone or by letter within the next 1-3 weeks.  Please call us at [phone] if you have not heard about the biopsies in 3 weeks.    SIGNATURES/CONFIDENTIALITY: You and/or your care partner have signed paperwork which will be entered into your electronic medical record.  These signatures attest to the fact that that the information above on your After Visit Summary has been reviewed and is understood.  Full responsibility of the confidentiality of this discharge information lies with you and/or your care-partner.  Thank you for letting us take care of your healthcare needs today. 

## 2017-06-20 ENCOUNTER — Telehealth: Payer: Self-pay

## 2017-06-20 NOTE — Telephone Encounter (Signed)
  Follow up Call-  Call Darren [name] number 06/19/2017 03/02/2015  Post procedure Call Darren [name] phone  # [phone]  Permission to leave phone message Yes Yes  Some recent data might be hidden     Patient questions:  Do you have a fever, pain , or abdominal swelling? No. Pain Score  0 *  Have you tolerated food without any problems? Yes.    Have you been able to return to your normal activities? Yes.    Do you have any questions about your discharge instructions: Diet   No. Medications  No. Follow up visit  No.  Do you have questions or concerns about your Care? No.  Actions: * If pain score is 4 or above: No action needed, pain <4.

## 2017-10-01 ENCOUNTER — Telehealth: Payer: Self-pay | Admitting: Family Medicine

## 2017-10-01 DIAGNOSIS — N400 Enlarged prostate without lower urinary tract symptoms: Secondary | ICD-10-CM

## 2017-10-01 DIAGNOSIS — Z0000 Encounter for general adult medical examination without abnormal findings: Secondary | ICD-10-CM

## 2017-10-01 DIAGNOSIS — R7309 Other abnormal glucose: Secondary | ICD-10-CM

## 2017-10-01 DIAGNOSIS — E7800 Pure hypercholesterolemia, unspecified: Secondary | ICD-10-CM

## 2017-10-01 NOTE — Telephone Encounter (Signed)
-----   Message from Eustace Pen, LPN sent at 09/27/8014  4:11 PM EDT ----- Regarding: Labs 9/13 Lab orders needed. Thank you.  Insurance:  [facility]

## 2017-10-04 ENCOUNTER — Ambulatory Visit: Payer: Medicare Other

## 2017-10-05 ENCOUNTER — Ambulatory Visit: Payer: Medicare Other

## 2017-10-05 ENCOUNTER — Ambulatory Visit (INDEPENDENT_AMBULATORY_CARE_PROVIDER_SITE_OTHER): Payer: Medicare Other

## 2017-10-05 VITALS — BP 122/74 | HR 56 | Temp 97.8°F | Ht 72.0 in | Wt 210.5 lb

## 2017-10-05 DIAGNOSIS — Z23 Encounter for immunization: Secondary | ICD-10-CM | POA: Diagnosis not present

## 2017-10-05 DIAGNOSIS — R7309 Other abnormal glucose: Secondary | ICD-10-CM | POA: Diagnosis not present

## 2017-10-05 DIAGNOSIS — E7800 Pure hypercholesterolemia, unspecified: Secondary | ICD-10-CM

## 2017-10-05 DIAGNOSIS — N400 Enlarged prostate without lower urinary tract symptoms: Secondary | ICD-10-CM | POA: Diagnosis not present

## 2017-10-05 DIAGNOSIS — Z0000 Encounter for general adult medical examination without abnormal findings: Secondary | ICD-10-CM | POA: Diagnosis not present

## 2017-10-05 LAB — CBC WITH DIFFERENTIAL/PLATELET
BASOS PCT: 0.9 % (ref 0.0–3.0)
Basophils Absolute: 0 10*3/uL (ref 0.0–0.1)
EOS PCT: 2 % (ref 0.0–5.0)
Eosinophils Absolute: 0.1 10*3/uL (ref 0.0–0.7)
HEMATOCRIT: 42.1 % (ref 39.0–52.0)
Hemoglobin: 14.4 g/dL (ref 13.0–17.0)
LYMPHS PCT: 29.4 % (ref 12.0–46.0)
Lymphs Abs: 1.1 10*3/uL (ref 0.7–4.0)
MCHC: 34.1 g/dL (ref 30.0–36.0)
MCV: 98.1 fl (ref 78.0–100.0)
Monocytes Absolute: 0.5 10*3/uL (ref 0.1–1.0)
Monocytes Relative: 12.7 % — ABNORMAL HIGH (ref 3.0–12.0)
Neutro Abs: 2 10*3/uL (ref 1.4–7.7)
Neutrophils Relative %: 55 % (ref 43.0–77.0)
Platelets: 156 10*3/uL (ref 150.0–400.0)
RBC: 4.29 Mil/uL (ref 4.22–5.81)
RDW: 12.2 % (ref 11.5–15.5)
WBC: 3.6 10*3/uL — ABNORMAL LOW (ref 4.0–10.5)

## 2017-10-05 LAB — LIPID PANEL
CHOLESTEROL: 173 mg/dL (ref 0–200)
HDL: 53.9 mg/dL (ref >39.00)
LDL Cholesterol: 104 mg/dL — ABNORMAL HIGH (ref 0–99)
NonHDL: 119.03
TRIGLYCERIDES: 77 mg/dL (ref 0.0–149.0)
Total CHOL/HDL Ratio: 3
VLDL: 15.4 mg/dL (ref 0.0–40.0)

## 2017-10-05 LAB — PSA, MEDICARE: PSA: 0.59 ng/mL (ref 0.10–4.00)

## 2017-10-05 LAB — TSH: TSH: 2.85 u[IU]/mL (ref 0.35–4.50)

## 2017-10-05 LAB — HEMOGLOBIN A1C: HEMOGLOBIN A1C: 5.3 % (ref 4.6–6.5)

## 2017-10-05 NOTE — Patient Instructions (Addendum)
Darren [name] , Thank you for taking time to come for your Medicare Wellness Visit. I appreciate your ongoing commitment to your health goals. Please review the following plan we discussed and let me know if I can assist you in the future.   These are the goals we discussed: Goals    . Increase physical activity     Starting 10/05/2017, I will continue to exercise for at least 60 min 5-6 days per week.        This is a list of the screening recommended for you and due dates:  Health Maintenance  Topic Date Due  . Tetanus Vaccine  12/03/2018  . Colon Cancer Screening  03/01/2025  . Flu Shot  Completed  .  Hepatitis C: One time screening is recommended by Center for Disease Control  (CDC) for  adults born from [birth year] through 1965.   Completed  . Pneumonia vaccines  Completed   Preventive Care for Adults  A healthy lifestyle and preventive care can promote health and wellness. Preventive health guidelines for adults include the following key practices.  . A routine yearly physical is a good way to check with your health care provider about your health and preventive screening. It is a chance to share any concerns and updates on your health and to receive a thorough exam.  . Visit your dentist for a routine exam and preventive care every 6 months. Brush your teeth twice a day and floss once a day. Good oral hygiene prevents tooth decay and gum disease.  . The frequency of eye exams is based on your age, health, family medical history, use  of contact lenses, and other factors. Follow your health care provider's recommendations for frequency of eye exams.  . Eat a healthy diet. Foods like vegetables, fruits, whole grains, low-fat dairy products, and lean protein foods contain the nutrients you need without too many calories. Decrease your intake of foods high in solid fats, added sugars, and salt. Eat the right amount of calories for you. Get information about a proper diet from your health  care provider, if necessary.  . Regular physical exercise is one of the most important things you can do for your health. Most adults should get at least 150 minutes of moderate-intensity exercise (any activity that increases your heart rate and causes you to sweat) each week. In addition, most adults need muscle-strengthening exercises on 2 or more days a week.  Silver Sneakers may be a benefit available to you. To determine eligibility, you may visit the website: www.silversneakers.com or contact program at (218) 604-1831 Mon-Fri between 8AM-8PM.   . Maintain a healthy weight. The body mass index (BMI) is a screening tool to identify possible weight problems. It provides an estimate of body fat based on height and weight. Your health care provider can find your BMI and can help you achieve or maintain a healthy weight.   For adults 20 years and older: ? A BMI below 18.5 is considered underweight. ? A BMI of 18.5 to 24.9 is normal. ? A BMI of 25 to 29.9 is considered overweight. ? A BMI of 30 and above is considered obese.   . Maintain normal blood lipids and cholesterol levels by exercising and minimizing your intake of saturated fat. Eat a balanced diet with plenty of fruit and vegetables. Blood tests for lipids and cholesterol should begin at age 17 and be repeated every 5 years. If your lipid or cholesterol levels are high, you are over 50,  or you are at high risk for heart disease, you may need your cholesterol levels checked more frequently. Ongoing high lipid and cholesterol levels should be treated with medicines if diet and exercise are not working.  . If you smoke, find out from your health care provider how to quit. If you do not use tobacco, please do not start.  . If you choose to drink alcohol, please do not consume more than 2 drinks per day. One drink is considered to be 12 ounces (355 mL) of beer, 5 ounces (148 mL) of wine, or 1.5 ounces (44 mL) of liquor.  . If you are 55-61  years old, ask your health care provider if you should take aspirin to prevent strokes.  . Use sunscreen. Apply sunscreen liberally and repeatedly throughout the day. You should seek shade when your shadow is shorter than you. Protect yourself by wearing long sleeves, pants, a wide-brimmed hat, and sunglasses year round, whenever you are outdoors.  . Once a month, do a whole body skin exam, using a mirror to look at the skin on your back. Tell your health care provider of new moles, moles that have irregular borders, moles that are larger than a pencil eraser, or moles that have changed in shape or color.

## 2017-10-07 NOTE — Progress Notes (Signed)
Subjective:   Darren [name] is a 72 y.o. male who presents for Medicare Annual/Subsequent preventive examination.  Review of Systems:  N/A Cardiac Risk Factors include: advanced age (>43men, >77 women);male gender;dyslipidemia     Objective:    Vitals: BP 122/74 (BP Location: Right Arm, Patient Position: Sitting, Cuff Size: Normal)   Pulse (!) 56   Temp 97.8 F (36.6 C) (Oral)   Ht 6' (1.829 m) Comment: no shoes  Wt 210 lb 8 oz (95.5 kg)   SpO2 99%   BMI 28.55 kg/m   Body mass index is 28.55 kg/m.  Advanced Directives 10/05/2017 06/19/2017 09/17/2015 02/17/2015  Does Patient Have a Medical Advance Directive? Yes Yes Yes Yes  Type of Paramedic of [place];Living will [place];Living will [place];Living will [place];Living will  Does patient want to make changes to medical advance directive? - No - Patient declined No - Patient declined -  Copy of [place] in Chart? No - copy requested No - copy requested No - copy requested -    Tobacco Social History   Tobacco Use  Smoking Status Former Smoker  . Packs/day: 1.00  . Years: 5.00  . Pack years: 5.00  . Types: Cigarettes  . Last attempt to quit: 01/24/1968  . Years since quitting: 49.7  Smokeless Tobacco Never Used     Counseling given: No   Clinical Intake:  Pre-visit preparation completed: Yes  Pain : No/denies pain Pain Score: 0-No pain     Nutritional Status: BMI 25 -29 Overweight Nutritional Risks: None Diabetes: No  How often do you need to have someone help you when you read instructions, pamphlets, or other written materials from your doctor or pharmacy?: 1 - Never What is the last grade level you completed in school?: Doctor of Philosophy  Interpreter Needed?: No  Comments: pt lives with spouse Information entered by :: LPinson, LPN  Past Medical History:  Diagnosis Date  . Basal cell  carcinoma    face  . GERD (gastroesophageal reflux disease)   . History of colon polyps   . Hypertrophy of prostate without urinary obstruction and other lower urinary tract symptoms (LUTS)   . Leukocytopenia, unspecified   . Other and unspecified hyperlipidemia    no meds  . Overactive bladder    Past Surgical History:  Procedure Laterality Date  . COLONOSCOPY     multiple - polyps  . PROSTATE SURGERY  08/2012   [facility]  at [facility]  . ROTATOR CUFF REPAIR Right 2003  . UPPER GI ENDOSCOPY    . WISDOM TOOTH EXTRACTION     Family History  Problem Relation Age of Onset  . Heart attack Father   . Breast cancer Mother   . Thyroid cancer Maternal Grandmother   . Colon cancer Neg Hx   . Esophageal cancer Neg Hx   . Rectal cancer Neg Hx   . Stomach cancer Neg Hx    Social History   Socioeconomic History  . Marital status: Married    Spouse name: Not on file  . Number of children: 1  . Years of education: Not on file  . Highest education level: Not on file  Occupational History  . Occupation: retired college professor  Social Needs  . Financial resource strain: Not on file  . Food insecurity:    Worry: Not on file    Inability: Not on file  . Transportation needs:  Medical: Not on file    Non-medical: Not on file  Tobacco Use  . Smoking status: Former Smoker    Packs/day: 1.00    Years: 5.00    Pack years: 5.00    Types: Cigarettes    Last attempt to quit: 01/24/1968    Years since quitting: 49.7  . Smokeless tobacco: Never Used  Substance and Sexual Activity  . Alcohol use: Yes    Alcohol/week: 14.0 - 21.0 standard drinks    Types: 14 - 21 Glasses of wine per week    Comment: 2-3 glasses wine/day  . Drug use: No  . Sexual activity: Yes  Lifestyle  . Physical activity:    Days per week: Not on file    Minutes per session: Not on file  . Stress: Not on file  Relationships  . Social connections:    Talks on phone: Not on file    Gets together: Not on file     Attends religious service: Not on file    Active member of club or organization: Not on file    Attends meetings of clubs or organizations: Not on file    Relationship status: Not on file  Other Topics Concern  . Not on file  Social History Narrative  . Not on file    Outpatient Encounter Medications as of 10/05/2017  Medication Sig  . ALPRAZolam (XANAX) 0.5 MG tablet Take 1 by mouth up to twice daily for airplane flight  . bismuth subsalicylate (PEPTO BISMOL) 262 MG/15ML suspension Take 30 mLs by mouth as needed.  . calcium carbonate (TUMS EX) 750 MG chewable tablet Chew 2-3 tablets by mouth at bedtime as needed for heartburn.  . famotidine (PEPCID) 20 MG tablet Take 40 mg by mouth at bedtime.   . Methylcellulose, Laxative, (CITRUCEL PO) Take 2 tablets by mouth daily.  Marland Kitchen MYRBETRIQ 50 MG TB24 tablet Take 50 mg by mouth daily.  Marland Kitchen omeprazole (PRILOSEC OTC) 20 MG tablet Take 20 mg by mouth every morning.    Facility-Administered Encounter Medications as of 10/05/2017  Medication  . 0.9 %  sodium chloride infusion    Activities of Daily Living In your present state of health, do you have any difficulty performing the following activities: 10/05/2017  Hearing? N  Vision? N  Difficulty concentrating or making decisions? N  Walking or climbing stairs? N  Dressing or bathing? N  Doing errands, shopping? N  Preparing Food and eating ? N  Using the Toilet? N  In the past six months, have you accidently leaked urine? N  Do you have problems with loss of bowel control? N  Managing your Medications? N  Managing your Finances? N  Housekeeping or managing your Housekeeping? N  Some recent data might be hidden    Patient Care Team: Tower, Wynelle Fanny, MD as PCP - General   Assessment:   This is a routine wellness examination for Darren [name].   Hearing Screening   125Hz  250Hz  500Hz  1000Hz  2000Hz  3000Hz  4000Hz  6000Hz  8000Hz   Right ear:   40 0 40  40    Left ear:   40 0 40  40    Vision Screening  Comments: Vision exam in April 2019 with Dr. Eula Flax   Exercise Activities and Dietary recommendations Current Exercise Habits: Home exercise routine, Time (Minutes): 60, Frequency (Times/Week): 6, Weekly Exercise (Minutes/Week): 360, Intensity: Moderate, Exercise limited by: None identified  Goals    . Increase physical activity     Starting 10/05/2017,  I will continue to exercise for at least 60 min 5-6 days per week.        Fall Risk Fall Risk  10/05/2017 09/19/2016 09/17/2015 08/18/2014 05/13/2013  Falls in the past year? No No No No No   Depression Screen PHQ 2/9 Scores 10/05/2017 09/19/2016 09/17/2015 08/18/2014  PHQ - 2 Score 0 0 0 0  PHQ- 9 Score 0 - - -    Cognitive Function MMSE - Mini Mental State Exam 10/05/2017 09/17/2015  Orientation to time 5 5  Orientation to Place 5 5  Registration 3 3  Attention/ Calculation 0 0  Recall 3 3  Language- name 2 objects 0 0  Language- repeat 1 1  Language- follow 3 step command 3 3  Language- read & follow direction 0 0  Write a sentence 0 0  Copy design 0 0  Total score 20 20     PLEASE NOTE: A Mini-Cog screen was completed. Maximum score is 20. A value of 0 denotes this part of [place] MMSE was not completed or the patient failed this part of the Mini-Cog screening.   Mini-Cog Screening Orientation to Time - Max 5 pts Orientation to Place - Max 5 pts Registration - Max 3 pts Recall - Max 3 pts Language Repeat - Max 1 pts Language Follow 3 Step Command - Max 3 pts    Immunization History  Administered Date(s) Administered  . H1N1 02/06/2008  . Hepatitis A 11/26/2003, 06/24/2004  . Influenza Split 10/26/2010, 10/23/2011  . Influenza Whole 12/15/2003, 11/16/2006, 10/27/2008, 10/14/2009  . Influenza,inj,Quad PF,6+ Mos 10/09/2012, 10/29/2013, 10/13/2014, 10/29/2015, 11/10/2016, 10/05/2017  . Pneumococcal Conjugate-13 09/17/2015  . Pneumococcal Polysaccharide-23 09/09/2007, 05/13/2013  . Td 07/29/1998, 12/02/2008  .  Zoster 09/28/2005    Screening Tests Health Maintenance  Topic Date Due  . TETANUS/TDAP  12/03/2018  . COLONOSCOPY  03/01/2025  . INFLUENZA VACCINE  Completed  . Hepatitis C Screening  Completed  . PNA vac Low Risk Adult  Completed       Plan:     I have personally reviewed, addressed, and noted the following in the patient's chart:  A. Medical and social history B. Use of alcohol, tobacco or illicit drugs  C. Current medications and supplements D. Functional ability and status E.  Nutritional status F.  Physical activity G. Advance directives H. List of other physicians I.  Hospitalizations, surgeries, and ER visits in previous 12 months J.  [place]ale to include hearing, vision, cognitive, depression L. Referrals and appointments - none  In addition, I have reviewed and discussed with patient certain preventive protocols, quality metrics, and best practice recommendations. A written personalized care plan for preventive services as well as general preventive health recommendations were provided to patient.  See attached scanned questionnaire for additional information.   Signed,   Lindell Noe, MHA, BS, LPN Health Coach

## 2017-10-07 NOTE — Progress Notes (Signed)
PCP notes:   Health maintenance:  Flu vaccine - administered  Abnormal screenings:   Hearing - failed  Hearing Screening   125Hz  250Hz  500Hz  1000Hz  2000Hz  3000Hz  4000Hz  6000Hz  8000Hz   Right ear:   40 0 40  40    Left ear:   40 0 40  40     Patient concerns:   None  Nurse concerns:  None  Next PCP appt:   10/09/17 @ 9:30  I reviewed health advisor's note, was available for consultation, and agree with documentation and plan. Loura Pardon MD

## 2017-10-09 ENCOUNTER — Ambulatory Visit (INDEPENDENT_AMBULATORY_CARE_PROVIDER_SITE_OTHER): Payer: Medicare Other | Admitting: Family Medicine

## 2017-10-09 ENCOUNTER — Encounter: Payer: Self-pay | Admitting: Family Medicine

## 2017-10-09 VITALS — BP 116/68 | HR 64 | Temp 97.8°F | Ht 72.0 in | Wt 210.5 lb

## 2017-10-09 DIAGNOSIS — E7800 Pure hypercholesterolemia, unspecified: Secondary | ICD-10-CM

## 2017-10-09 DIAGNOSIS — N400 Enlarged prostate without lower urinary tract symptoms: Secondary | ICD-10-CM

## 2017-10-09 DIAGNOSIS — K219 Gastro-esophageal reflux disease without esophagitis: Secondary | ICD-10-CM

## 2017-10-09 DIAGNOSIS — Z0000 Encounter for general adult medical examination without abnormal findings: Secondary | ICD-10-CM | POA: Diagnosis not present

## 2017-10-09 DIAGNOSIS — R7309 Other abnormal glucose: Secondary | ICD-10-CM | POA: Diagnosis not present

## 2017-10-09 NOTE — Assessment & Plan Note (Addendum)
Disc goals for lipids and reasons to control them Rev last labs with pt Rev low sat fat diet in detail LDL is improved and ratio is down to 3  He cut back on shellfish Reassuring

## 2017-10-09 NOTE — Assessment & Plan Note (Signed)
Good A1C  Lab Results  Component Value Date   HGBA1C 5.3 10/05/2017   disc imp of low glycemic diet and wt loss to prevent DM2

## 2017-10-09 NOTE — Assessment & Plan Note (Signed)
Pt is interested in weaning PPI if possible - he had good EGD recently  Disc trying omeprazole every other day to start and then try to stop Handout given on diet for GERd -  ? If food diary would help He continues pepcid  Will update

## 2017-10-09 NOTE — Progress Notes (Signed)
Subjective:    Patient ID: Darren [name], male    DOB: 25-Jul-1945, 72 y.o.   MRN: [id number]  HPI  Here for health maintenance exam and to review chronic medical problems    Taking good care of himself   Wt Readings from Last 3 Encounters:  10/09/17 210 lb 8 oz (95.5 kg)  10/05/17 210 lb 8 oz (95.5 kg)  06/19/17 212 lb (96.2 kg)  goes to the gym- has a fitbit now-helps him track activity and encourages him  Diet is good  28.55 kg/m   Had amw on 9/13 Had his flu vaccine  Missed 1000 Hz on hearing exam both ears He has to ask people to repeat things -otherwise no problems  Not bothering him   Colonoscopy 2/17 with 10 y recall  EGD done this year looked good -takes prilosec (wants to start backing off)   Prostate health Lab Results  Component Value Date   PSA 0.59 10/05/2017   PSA 0.56 09/13/2016   PSA 0.58 09/13/2015  h/o Turp for BPH in the fall  Sees urology On myrbetriq   zostavax 2007 Is interested in shingrix    BP BP Readings from Last 3 Encounters:  10/09/17 116/68  10/05/17 122/74  06/19/17 131/75   Cholesterol Lab Results  Component Value Date   CHOL 173 10/05/2017   CHOL 211 (H) 09/13/2016   CHOL 203 (H) 09/13/2015   Lab Results  Component Value Date   HDL 53.90 10/05/2017   HDL 57.50 09/13/2016   HDL 63.70 09/13/2015   Lab Results  Component Value Date   LDLCALC 104 (H) 10/05/2017   LDLCALC 141 (H) 09/13/2016   LDLCALC 128 (H) 09/13/2015   Lab Results  Component Value Date   TRIG 77.0 10/05/2017   TRIG 63.0 09/13/2016   TRIG 57.0 09/13/2015   Lab Results  Component Value Date   CHOLHDL 3 10/05/2017   CHOLHDL 4 09/13/2016   CHOLHDL 3 09/13/2015   Lab Results  Component Value Date   LDLDIRECT 127.7 02/15/2009   LDLDIRECT 144.2 12/02/2008   LDLDIRECT 134.0 03/21/2007   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  LDL is down significantly - to 104 Eating less shellfish (more regular fish)   Elevated glucose in the  past Lab Results  Component Value Date   HGBA1C 5.3 10/05/2017  below diabetic range  This is good    Lab Results  Component Value Date   CREATININE 0.98 09/13/2016   BUN 15 09/13/2016   NA 140 09/13/2016   K 4.1 09/13/2016   CL 106 09/13/2016   CO2 31 09/13/2016   Lab Results  Component Value Date   ALT 18 09/13/2016   AST 16 09/13/2016   ALKPHOS 38 (L) 09/13/2016   BILITOT 0.5 09/13/2016    Lab Results  Component Value Date   WBC 3.6 (L) 10/05/2017   HGB 14.4 10/05/2017   HCT 42.1 10/05/2017   MCV 98.1 10/05/2017   PLT 156.0 10/05/2017     Review of Systems  Constitutional: Negative for activity change, appetite change, fatigue, fever and unexpected weight change.  HENT: Negative for congestion, rhinorrhea, sore throat and trouble swallowing.   Eyes: Negative for pain, redness, itching and visual disturbance.  Respiratory: Negative for cough, chest tightness, shortness of breath and wheezing.   Cardiovascular: Negative for chest pain and palpitations.  Gastrointestinal: Negative for abdominal pain, blood in stool, constipation, diarrhea and nausea.  Endocrine: Negative for cold intolerance, heat intolerance, polydipsia  and polyuria.  Genitourinary: Negative for difficulty urinating, dysuria, frequency and urgency.  Musculoskeletal: Negative for arthralgias, joint swelling and myalgias.       Aches and pains   Skin: Negative for pallor and rash.  Neurological: Negative for dizziness, tremors, weakness, numbness and headaches.  Hematological: Negative for adenopathy. Does not bruise/bleed easily.  Psychiatric/Behavioral: Negative for decreased concentration and dysphoric mood. The patient is not nervous/anxious.        Objective:   Physical Exam  Constitutional: He appears well-developed and well-nourished. No distress.  overwt and well appearing  HENT:  Head: Normocephalic and atraumatic.  Right Ear: External ear normal.  Left Ear: External ear normal.   Nose: Nose normal.  Mouth/Throat: Oropharynx is clear and moist.  Talks loudly  Eyes: Pupils are equal, round, and reactive to light. Conjunctivae and EOM are normal. Right eye exhibits no discharge. Left eye exhibits no discharge. No scleral icterus.  Neck: Normal range of motion. Neck supple. No JVD present. Carotid bruit is not present. No thyromegaly present.  Cardiovascular: Normal rate, regular rhythm, normal heart sounds and intact distal pulses. Exam reveals no gallop.  Pulmonary/Chest: Effort normal and breath sounds normal. No respiratory distress. He has no wheezes. He exhibits no tenderness.  Abdominal: Soft. Bowel sounds are normal. He exhibits no distension, no abdominal bruit and no mass. There is no tenderness.  Musculoskeletal: He exhibits no edema, tenderness or deformity.  Lymphadenopathy:    He has no cervical adenopathy.  Neurological: He is alert. He has normal reflexes. He displays normal reflexes. No cranial nerve deficit. He exhibits normal muscle tone. Coordination normal.  Skin: Skin is warm and dry. No rash noted. No erythema. No pallor.  Few lentigines Fair complexion  Psychiatric: He has a normal mood and affect.          Assessment & Plan:   Problem List Items Addressed This Visit      Digestive   GERD    Pt is interested in weaning PPI if possible - he had good EGD recently  Disc trying omeprazole every other day to start and then try to stop Handout given on diet for GERd -  ? If food diary would help He continues pepcid  Will update         Genitourinary   BPH (benign prostatic hyperplasia)    Continues myrbetriq -doing well  Sees urology Lab Results  Component Value Date   PSA 0.59 10/05/2017   PSA 0.56 09/13/2016   PSA 0.58 09/13/2015           Other   Elevated glucose level    Good A1C  Lab Results  Component Value Date   HGBA1C 5.3 10/05/2017   disc imp of low glycemic diet and wt loss to prevent DM2        Hyperlipidemia    Disc goals for lipids and reasons to control them Rev last labs with pt Rev low sat fat diet in detail LDL is improved and ratio is down to 3  He cut back on shellfish Reassuring       Routine general medical examination at a health care facility - Primary    Reviewed health habits including diet and exercise and skin cancer prevention Reviewed appropriate screening tests for age  Also reviewed health mt list, fam hx and immunization status , as well as social and family history   See HPI Labs rev  amw visit rev (he is not ready for further hearing  eval yet)  Enc continued good diet/exercise Also yearly derm visits for screening  Enc him to get on wait list for shingrix

## 2017-10-09 NOTE — Assessment & Plan Note (Signed)
Continues myrbetriq -doing well  Sees urology Lab Results  Component Value Date   PSA 0.59 10/05/2017   PSA 0.56 09/13/2016   PSA 0.58 09/13/2015

## 2017-10-09 NOTE — Patient Instructions (Addendum)
If you want to cut back on omeprazole - take every other day for 2 weeks  If you do ok- then stop entirely  Eat a healthy diet for GERD- avoid the foods that make you worse  Continue the pepcid   If you are interested in the new shingles vaccine (Shingrix) - call your local pharmacy to check on coverage and availability  If affordable - get on a wait list at your pharmacy   For cholesterol Avoid red meat/ fried foods/ egg yolks/ fatty breakfast meats/ butter, cheese and high fat dairy/ and shellfish    Keep taking good care of yourself

## 2017-10-09 NOTE — Assessment & Plan Note (Signed)
Reviewed health habits including diet and exercise and skin cancer prevention Reviewed appropriate screening tests for age  Also reviewed health mt list, fam hx and immunization status , as well as social and family history   See HPI Labs rev  amw visit rev (he is not ready for further hearing eval yet)  Enc continued good diet/exercise Also yearly derm visits for screening  Enc him to get on wait list for shingrix

## 2018-07-01 ENCOUNTER — Telehealth: Payer: Self-pay | Admitting: *Deleted

## 2018-07-01 NOTE — Telephone Encounter (Signed)
Patient left a voicemail stating that he has had a lot of blood work done at the office. Patient wants to know if his blood type has been checked and if so, he wants to know what it is.

## 2018-07-01 NOTE — Telephone Encounter (Signed)
I called patient to inform him of Dr. Marliss Coots response.  Patient verbalized understanding.

## 2018-07-01 NOTE — Telephone Encounter (Signed)
We do not check blood types.  This is generally done if prepping for a transfusion.  If he has a birth certificate it may list it there.  If he donates blood-the [facility] will have it

## 2018-08-20 ENCOUNTER — Telehealth: Payer: Self-pay | Admitting: Family Medicine

## 2018-08-20 DIAGNOSIS — Z209 Contact with and (suspected) exposure to unspecified communicable disease: Secondary | ICD-10-CM | POA: Insufficient documentation

## 2018-08-20 NOTE — Telephone Encounter (Signed)
Pt notified order is in and given testing site location info

## 2018-08-20 NOTE — Telephone Encounter (Signed)
Pt's spouse called to request covid test for pt. He is not having any symptoms currently but they are heading to [place] and will need proof of negative test. Pt is leaving Thursday 7/30 and is aware testing takes 7+ days for results.

## 2018-08-20 NOTE — Telephone Encounter (Signed)
Order is in.

## 2018-08-21 ENCOUNTER — Other Ambulatory Visit: Payer: Self-pay

## 2018-08-21 DIAGNOSIS — Z20822 Contact with and (suspected) exposure to covid-19: Secondary | ICD-10-CM

## 2018-08-23 LAB — NOVEL CORONAVIRUS, NAA: SARS-CoV-2, NAA: NOT DETECTED

## 2018-10-07 ENCOUNTER — Telehealth: Payer: Self-pay | Admitting: Family Medicine

## 2018-10-07 ENCOUNTER — Other Ambulatory Visit (INDEPENDENT_AMBULATORY_CARE_PROVIDER_SITE_OTHER): Payer: Medicare Other

## 2018-10-07 ENCOUNTER — Other Ambulatory Visit: Payer: Self-pay

## 2018-10-07 DIAGNOSIS — Z0000 Encounter for general adult medical examination without abnormal findings: Secondary | ICD-10-CM | POA: Diagnosis not present

## 2018-10-07 DIAGNOSIS — E7800 Pure hypercholesterolemia, unspecified: Secondary | ICD-10-CM | POA: Diagnosis not present

## 2018-10-07 DIAGNOSIS — N400 Enlarged prostate without lower urinary tract symptoms: Secondary | ICD-10-CM | POA: Diagnosis not present

## 2018-10-07 DIAGNOSIS — R7309 Other abnormal glucose: Secondary | ICD-10-CM | POA: Diagnosis not present

## 2018-10-07 LAB — COMPREHENSIVE METABOLIC PANEL
ALT: 15 U/L (ref 0–53)
AST: 15 U/L (ref 0–37)
Albumin: 4.3 g/dL (ref 3.5–5.2)
Alkaline Phosphatase: 42 U/L (ref 39–117)
BUN: 18 mg/dL (ref 6–23)
CO2: 30 mEq/L (ref 19–32)
Calcium: 9.7 mg/dL (ref 8.4–10.5)
Chloride: 104 mEq/L (ref 96–112)
Creatinine, Ser: 1.05 mg/dL (ref 0.40–1.50)
GFR: 69.11 mL/min (ref >60.00)
Glucose, Bld: 90 mg/dL (ref 70–99)
Potassium: 4 mEq/L (ref 3.5–5.1)
Sodium: 140 mEq/L (ref 135–145)
Total Bilirubin: 0.5 mg/dL (ref 0.2–1.2)
Total Protein: 6.4 g/dL (ref 6.0–8.3)

## 2018-10-07 LAB — CBC WITH DIFFERENTIAL/PLATELET
Basophils Absolute: 0 10*3/uL (ref 0.0–0.1)
Basophils Relative: 1.2 % (ref 0.0–3.0)
Eosinophils Absolute: 0.1 10*3/uL (ref 0.0–0.7)
Eosinophils Relative: 2 % (ref 0.0–5.0)
HCT: 42.5 % (ref 39.0–52.0)
Hemoglobin: 14.5 g/dL (ref 13.0–17.0)
Lymphocytes Relative: 33.5 % (ref 12.0–46.0)
Lymphs Abs: 1.3 10*3/uL (ref 0.7–4.0)
MCHC: 34.1 g/dL (ref 30.0–36.0)
MCV: 100.2 fl — ABNORMAL HIGH (ref 78.0–100.0)
Monocytes Absolute: 0.5 10*3/uL (ref 0.1–1.0)
Monocytes Relative: 11.6 % (ref 3.0–12.0)
Neutro Abs: 2 10*3/uL (ref 1.4–7.7)
Neutrophils Relative %: 51.7 % (ref 43.0–77.0)
Platelets: 147 10*3/uL — ABNORMAL LOW (ref 150.0–400.0)
RBC: 4.24 Mil/uL (ref 4.22–5.81)
RDW: 12.2 % (ref 11.5–15.5)
WBC: 3.9 10*3/uL — ABNORMAL LOW (ref 4.0–10.5)

## 2018-10-07 LAB — LIPID PANEL
Cholesterol: 190 mg/dL (ref 0–200)
HDL: 63.3 mg/dL (ref >39.00)
LDL Cholesterol: 112 mg/dL — ABNORMAL HIGH (ref 0–99)
NonHDL: 127.13
Total CHOL/HDL Ratio: 3
Triglycerides: 76 mg/dL (ref 0.0–149.0)
VLDL: 15.2 mg/dL (ref 0.0–40.0)

## 2018-10-07 LAB — TSH: TSH: 3.09 u[IU]/mL (ref 0.35–4.50)

## 2018-10-07 LAB — HEMOGLOBIN A1C: Hgb A1c MFr Bld: 5.3 % (ref 4.6–6.5)

## 2018-10-07 LAB — PSA, MEDICARE: PSA: 0.57 ng/ml (ref 0.10–4.00)

## 2018-10-07 NOTE — Telephone Encounter (Signed)
-----   Message from Ellamae Sia sent at 10/07/2018  7:39 AM EDT ----- Regarding: Lab orders asap Patient is scheduled for CPX labs, please order future labs, Thanks , Karna Christmas

## 2018-10-08 ENCOUNTER — Ambulatory Visit (INDEPENDENT_AMBULATORY_CARE_PROVIDER_SITE_OTHER): Payer: Medicare Other

## 2018-10-08 DIAGNOSIS — Z0000 Encounter for general adult medical examination without abnormal findings: Secondary | ICD-10-CM | POA: Diagnosis not present

## 2018-10-08 NOTE — Patient Instructions (Signed)
Darren [name] , Thank you for taking time to come for your Medicare Wellness Visit. I appreciate your ongoing commitment to your health goals. Please review the following plan we discussed and let me know if I can assist you in the future.   Screening recommendations/referrals: Colonoscopy: completed 03/02/15 Recommended yearly ophthalmology/optometry visit for glaucoma screening and checkup Recommended yearly dental visit for hygiene and checkup  Vaccinations: Influenza vaccine: completed 10/01/2018 Pneumococcal vaccine: series completed  Tdap vaccine: completed 12/02/2008 Shingles vaccine: completed 02/11/2018    Advanced directives: Please bring a copy of your POA (Power of [place]) and/or Living Will to your next appointment.   Conditions/risks identified: hyperlipidemia  Next appointment: 10/11/2018 @ 10:45 am   Preventive Care 65 Years and Older, Male Preventive care refers to lifestyle choices and visits with your health care provider that can promote health and wellness. What does preventive care include?  A yearly physical exam. This is also called an annual well check.  Dental exams once or twice a year.  Routine eye exams. Ask your health care provider how often you should have your eyes checked.  Personal lifestyle choices, including:  Daily care of your teeth and gums.  Regular physical activity.  Eating a healthy diet.  Avoiding tobacco and drug use.  Limiting alcohol use.  Practicing safe sex.  Taking low doses of aspirin every day.  Taking vitamin and mineral supplements as recommended by your health care provider. What happens during an annual well check? The services and screenings done by your health care provider during your annual well check will depend on your age, overall health, lifestyle risk factors, and family history of disease. Counseling  Your health care provider may ask you questions about your:  Alcohol use.  Tobacco use.  Drug use.   Emotional well-being.  Home and relationship well-being.  Sexual activity.  Eating habits.  History of falls.  Memory and ability to understand (cognition).  Work and work Statistician. Screening  You may have the following tests or measurements:  Height, weight, and BMI.  Blood pressure.  Lipid and cholesterol levels. These may be checked every 5 years, or more frequently if you are over 1 years old.  Skin check.  Lung cancer screening. You may have this screening every year starting at age 75 if you have a 30-pack-year history of smoking and currently smoke or have quit within the past 15 years.  Fecal occult blood test (FOBT) of the stool. You may have this test every year starting at age 4.  Flexible sigmoidoscopy or colonoscopy. You may have a sigmoidoscopy every 5 years or a colonoscopy every 10 years starting at age 66.  Prostate cancer screening. Recommendations will vary depending on your family history and other risks.  Hepatitis C blood test.  Hepatitis B blood test.  Sexually transmitted disease (STD) testing.  Diabetes screening. This is done by checking your blood sugar (glucose) after you have not eaten for a while (fasting). You may have this done every 1-3 years.  Abdominal aortic aneurysm (AAA) screening. You may need this if you are a current or former smoker.  Osteoporosis. You may be screened starting at age 45 if you are at high risk. Talk with your health care provider about your test results, treatment options, and if necessary, the need for more tests. Vaccines  Your health care provider may recommend certain vaccines, such as:  Influenza vaccine. This is recommended every year.  Tetanus, diphtheria, and acellular pertussis (Tdap, Td) vaccine.  You may need a Td booster every 10 years.  Zoster vaccine. You may need this after age 38.  Pneumococcal 13-valent conjugate (PCV13) vaccine. One dose is recommended after age 16.  Pneumococcal  polysaccharide (PPSV23) vaccine. One dose is recommended after age 48. Talk to your health care provider about which screenings and vaccines you need and how often you need them. This information is not intended to replace advice given to you by your health care provider. Make sure you discuss any questions you have with your health care provider. Document Released: 02/05/2015 Document Revised: 09/29/2015 Document Reviewed: 11/10/2014 Elsevier Interactive Patient Education  2017 [employer] Prevention in the Home Falls can cause injuries. They can happen to people of all ages. There are many things you can do to make your home safe and to help prevent falls. What can I do on the outside of my home?  Regularly fix the edges of walkways and driveways and fix any cracks.  Remove anything that might make you trip as you walk through a door, such as a raised step or threshold.  Trim any bushes or trees on the path to your home.  Use bright outdoor lighting.  Clear any walking paths of anything that might make someone trip, such as rocks or tools.  Regularly check to see if handrails are loose or broken. Make sure that both sides of any steps have handrails.  Any raised decks and porches should have guardrails on the edges.  Have any leaves, snow, or ice cleared regularly.  Use sand or salt on walking paths during winter.  Clean up any spills in your garage right away. This includes oil or grease spills. What can I do in the bathroom?  Use night lights.  Install grab bars by the toilet and in the tub and shower. Do not use towel bars as grab bars.  Use non-skid mats or decals in the tub or shower.  If you need to sit down in the shower, use a plastic, non-slip stool.  Keep the floor dry. Clean up any water that spills on the floor as soon as it happens.  Remove soap buildup in the tub or shower regularly.  Attach bath mats securely with double-sided non-slip rug tape.   Do not have throw rugs and other things on the floor that can make you trip. What can I do in the bedroom?  Use night lights.  Make sure that you have a light by your bed that is easy to reach.  Do not use any sheets or blankets that are too big for your bed. They should not hang down onto the floor.  Have a firm chair that has side arms. You can use this for support while you get dressed.  Do not have throw rugs and other things on the floor that can make you trip. What can I do in the kitchen?  Clean up any spills right away.  Avoid walking on wet floors.  Keep items that you use a lot in easy-to-reach places.  If you need to reach something above you, use a strong step stool that has a grab bar.  Keep electrical cords out of the way.  Do not use floor polish or wax that makes floors slippery. If you must use wax, use non-skid floor wax.  Do not have throw rugs and other things on the floor that can make you trip. What can I do with my stairs?  Do not leave any  items on the stairs.  Make sure that there are handrails on both sides of the stairs and use them. Fix handrails that are broken or loose. Make sure that handrails are as long as the stairways.  Check any carpeting to make sure that it is firmly attached to the stairs. Fix any carpet that is loose or worn.  Avoid having throw rugs at the top or bottom of the stairs. If you do have throw rugs, attach them to the floor with carpet tape.  Make sure that you have a light switch at the top of the stairs and the bottom of the stairs. If you do not have them, ask someone to add them for you. What else can I do to help prevent falls?  Wear shoes that:  Do not have high heels.  Have rubber bottoms.  Are comfortable and fit you well.  Are closed at the toe. Do not wear sandals.  If you use a stepladder:  Make sure that it is fully opened. Do not climb a closed stepladder.  Make sure that both sides of the  stepladder are locked into place.  Ask someone to hold it for you, if possible.  Clearly mark and make sure that you can see:  Any grab bars or handrails.  First and last steps.  Where the edge of each step is.  Use tools that help you move around (mobility aids) if they are needed. These include:  Canes.  Walkers.  Scooters.  Crutches.  Turn on the lights when you go into a dark area. Replace any light bulbs as soon as they burn out.  Set up your furniture so you have a clear path. Avoid moving your furniture around.  If any of your floors are uneven, fix them.  If there are any pets around you, be aware of where they are.  Review your medicines with your doctor. Some medicines can make you feel dizzy. This can increase your chance of falling. Ask your doctor what other things that you can do to help prevent falls. This information is not intended to replace advice given to you by your health care provider. Make sure you discuss any questions you have with your health care provider. Document Released: 11/05/2008 Document Revised: 06/17/2015 Document Reviewed: 02/13/2014 Elsevier Interactive Patient Education  2017 [employer].

## 2018-10-08 NOTE — Progress Notes (Signed)
Subjective:   Darren [name] is a 73 y.o. male who presents for Medicare Annual/Subsequent preventive examination.  Review of Systems:    This visit is being conducted through telemedicine due to the COVID-19 pandemic. This patient has given me verbal consent via doximity to conduct this visit, patient states they are participating from their home address. Some vital signs may be absent or patient reported.    Patient identification: identified by name, DOB, and current address  Cardiac Risk Factors include: advanced age (>81men, >83 women);male gender;dyslipidemia     Objective:    Vitals: There were no vitals taken for this visit.  There is no height or weight on file to calculate BMI.  Advanced Directives 10/08/2018 10/05/2017 06/19/2017 09/17/2015 02/17/2015  Does Patient Have a Medical Advance Directive? Yes Yes Yes Yes Yes  Type of Paramedic of [place];Living will [place];Living will [place];Living will [place];Living will [place];Living will  Does patient want to make changes to medical advance directive? - - No - Patient declined No - Patient declined -  Copy of [place] in Chart? No - copy requested No - copy requested No - copy requested No - copy requested -    Tobacco Social History   Tobacco Use  Smoking Status Former Smoker  . Packs/day: 1.00  . Years: 5.00  . Pack years: 5.00  . Types: Cigarettes  . Quit date: 01/24/1968  . Years since quitting: 50.7  Smokeless Tobacco Never Used     Counseling given: Not Answered   Clinical Intake:  Pre-visit preparation completed: Yes  Pain : No/denies pain     Nutritional Risks: None Diabetes: No  How often do you need to have someone help you when you read instructions, pamphlets, or other written materials from your doctor or pharmacy?: 1 - Never What is the last grade level you  completed in school?: Phd  Interpreter Needed?: No  Information entered by :: CJohnson, LPN  Past Medical History:  Diagnosis Date  . Basal cell carcinoma    face  . GERD (gastroesophageal reflux disease)   . History of colon polyps   . Hypertrophy of prostate without urinary obstruction and other lower urinary tract symptoms (LUTS)   . Leukocytopenia, unspecified   . Other and unspecified hyperlipidemia    no meds  . Overactive bladder    Past Surgical History:  Procedure Laterality Date  . COLONOSCOPY     multiple - polyps  . PROSTATE SURGERY  08/2012   [facility]  at [facility]  . ROTATOR CUFF REPAIR Right 2003  . UPPER GI ENDOSCOPY    . WISDOM TOOTH EXTRACTION     Family History  Problem Relation Age of Onset  . Heart attack Father   . Breast cancer Mother   . Thyroid cancer Maternal Grandmother   . Colon cancer Neg Hx   . Esophageal cancer Neg Hx   . Rectal cancer Neg Hx   . Stomach cancer Neg Hx    Social History   Socioeconomic History  . Marital status: Married    Spouse name: Not on file  . Number of children: 1  . Years of education: Not on file  . Highest education level: Not on file  Occupational History  . Occupation: retired college professor  Social Needs  . Financial resource strain: Not hard at all  . Food insecurity    Worry: Never true  Inability: Never true  . Transportation needs    Medical: No    Non-medical: No  Tobacco Use  . Smoking status: Former Smoker    Packs/day: 1.00    Years: 5.00    Pack years: 5.00    Types: Cigarettes    Quit date: 01/24/1968    Years since quitting: 50.7  . Smokeless tobacco: Never Used  Substance and Sexual Activity  . Alcohol use: Yes    Alcohol/week: 14.0 - 21.0 standard drinks    Types: 14 - 21 Glasses of wine per week    Comment: 2-3 glasses wine/day  . Drug use: No  . Sexual activity: Yes  Lifestyle  . Physical activity    Days per week: 7 days    Minutes per session: 30 min  . Stress: To some  extent  Relationships  . Social Herbalist on phone: Not on file    Gets together: Not on file    Attends religious service: Not on file    Active member of club or organization: Not on file    Attends meetings of clubs or organizations: Not on file    Relationship status: Not on file  Other Topics Concern  . Not on file  Social History Narrative  . Not on file    Outpatient Encounter Medications as of 10/08/2018  Medication Sig  . ALPRAZolam (XANAX) 0.5 MG tablet Take 1 by mouth up to twice daily for airplane flight  . bismuth subsalicylate (PEPTO BISMOL) 262 MG/15ML suspension Take 30 mLs by mouth as needed.  . calcium carbonate (TUMS EX) 750 MG chewable tablet Chew 2-3 tablets by mouth at bedtime as needed for heartburn.  . famotidine (PEPCID) 20 MG tablet Take 40 mg by mouth at bedtime.   . Methylcellulose, Laxative, (CITRUCEL PO) Take 2 tablets by mouth daily.  Marland Kitchen MYRBETRIQ 50 MG TB24 tablet Take 50 mg by mouth daily.  Marland Kitchen omeprazole (PRILOSEC OTC) 20 MG tablet Take 20 mg by mouth every morning.   . tadalafil (CIALIS) 5 MG tablet Take 5 mg by mouth daily as needed for erectile dysfunction.   Facility-Administered Encounter Medications as of 10/08/2018  Medication  . 0.9 %  sodium chloride infusion    Activities of Daily Living In your present state of health, do you have any difficulty performing the following activities: 10/08/2018  Hearing? Y  Comment sometimes  Vision? Y  Comment Patient has to have cataract surgery  Difficulty concentrating or making decisions? N  Walking or climbing stairs? N  Dressing or bathing? N  Doing errands, shopping? N  Preparing Food and eating ? N  Using the Toilet? N  In the past six months, have you accidently leaked urine? Y  Comment has enlarged prostate, takes medication  Do you have problems with loss of bowel control? N  Managing your Medications? N  Managing your Finances? N  Housekeeping or managing your Housekeeping? N   Some recent data might be hidden    Patient Care Team: Tower, Wynelle Fanny, MD as PCP - General   Assessment:   This is a routine wellness examination for Darren [name].  Exercise Activities and Dietary recommendations Current Exercise Habits: Home exercise routine, Type of exercise: walking, Time (Minutes): 30, Frequency (Times/Week): 7, Weekly Exercise (Minutes/Week): 210, Intensity: Mild, Exercise limited by: None identified  Goals    . Increase physical activity     Starting 10/05/2017, I will continue to exercise for at least 60 min 5-6  days per week.     . Patient Stated     10/08/18, Patient wants to get his cataract surgery done to improve his vision.       Fall Risk Fall Risk  10/08/2018 10/05/2017 09/19/2016 09/17/2015 08/18/2014  Falls in the past year? 0 No No No No  Number falls in past yr: 0 - - - -  Risk for fall due to : Medication side effect - - - -  Follow up Falls evaluation completed;Falls prevention discussed - - - -   Is the patient's home free of loose throw rugs in walkways, pet beds, electrical cords, etc?   yes      Grab bars in the bathroom? yes      Handrails on the stairs?   yes      Adequate lighting?   yes  Timed Get Up and Go Performed: n/a  Depression Screen PHQ 2/9 Scores 10/08/2018 10/05/2017 09/19/2016 09/17/2015  PHQ - 2 Score 0 0 0 0  PHQ- 9 Score 0 0 - -    Cognitive Function MMSE - Mini Mental State Exam 10/08/2018 10/05/2017 09/17/2015  Orientation to time 5 5 5   Orientation to Place 5 5 5   Registration 3 3 3   Attention/ Calculation 5 0 0  Recall 3 3 3   Language- name 2 objects - 0 0  Language- repeat 1 1 1   Language- follow 3 step command - 3 3  Language- read & follow direction - 0 0  Write a sentence - 0 0  Copy design - 0 0  Total score - 20 20     Mini Cog  Mini-Cog screen was completed. Maximum score is 22. A value of 0 denotes this part of the MMSE was not completed or the patient failed this part of the Mini-Cog screening.     Immunization History  Administered Date(s) Administered  . Fluad Quad(high Dose 65+) 10/01/2018  . H1N1 02/06/2008  . Hepatitis A 11/26/2003, 06/24/2004  . Influenza Split 10/26/2010, 10/23/2011  . Influenza Whole 12/15/2003, 11/16/2006, 10/27/2008, 10/14/2009  . Influenza,inj,Quad PF,6+ Mos 10/09/2012, 10/29/2013, 10/13/2014, 10/29/2015, 11/10/2016, 10/05/2017  . Pneumococcal Conjugate-13 09/17/2015  . Pneumococcal Polysaccharide-23 09/09/2007, 05/13/2013  . Td 07/29/1998, 12/02/2008  . Zoster 09/28/2005  . Zoster Recombinat (Shingrix) 02/11/2018    Qualifies for Shingles Vaccine? Completed 02/11/18  Screening Tests Health Maintenance  Topic Date Due  . TETANUS/TDAP  12/03/2018  . COLONOSCOPY  03/01/2025  . INFLUENZA VACCINE  Completed  . Hepatitis C Screening  Completed  . PNA vac Low Risk Adult  Completed   Cancer Screenings: Lung: Low Dose CT Chest recommended if Age 7-80 years, 30 pack-year currently smoking OR have quit w/in 15years. Patient does not qualify. Colorectal: completed 03/02/2015  Additional Screenings:  Hepatitis C Screening: completed 09/17/15      Plan:  Patient wants to get his cataract surgery done to improve his vision.  I have personally reviewed and noted the following in the patient's chart:   . Medical and social history . Use of alcohol, tobacco or illicit drugs  . Current medications and supplements . Functional ability and status . Nutritional status . Physical activity . Advanced directives . List of other physicians . Hospitalizations, surgeries, and ER visits in previous 12 months . Vitals . Screenings to include cognitive, depression, and falls . Referrals and appointments  In addition, I have reviewed and discussed with patient certain preventive protocols, quality metrics, and best practice recommendations. A written personalized care plan  for preventive services as well as general preventive health recommendations were provided  to patient.     Andrez Grime, LPN  [id number]

## 2018-10-08 NOTE — Progress Notes (Signed)
PCP notes: none  Health Maintenance: none     Abnormal Screenings: none    Patient concerns: none    Nurse concerns: none    Next PCP appt.: 10/11/2018 @ 10:45 am

## 2018-10-11 ENCOUNTER — Ambulatory Visit (INDEPENDENT_AMBULATORY_CARE_PROVIDER_SITE_OTHER): Payer: Medicare Other | Admitting: Family Medicine

## 2018-10-11 ENCOUNTER — Encounter: Payer: Self-pay | Admitting: Family Medicine

## 2018-10-11 ENCOUNTER — Ambulatory Visit: Payer: Medicare Other

## 2018-10-11 ENCOUNTER — Other Ambulatory Visit: Payer: Self-pay

## 2018-10-11 VITALS — BP 124/86 | HR 62 | Temp 97.5°F | Ht 71.75 in | Wt 208.5 lb

## 2018-10-11 DIAGNOSIS — E7800 Pure hypercholesterolemia, unspecified: Secondary | ICD-10-CM | POA: Diagnosis not present

## 2018-10-11 DIAGNOSIS — Z0000 Encounter for general adult medical examination without abnormal findings: Secondary | ICD-10-CM

## 2018-10-11 DIAGNOSIS — R7309 Other abnormal glucose: Secondary | ICD-10-CM | POA: Diagnosis not present

## 2018-10-11 DIAGNOSIS — N400 Enlarged prostate without lower urinary tract symptoms: Secondary | ICD-10-CM

## 2018-10-11 NOTE — Patient Instructions (Addendum)
For cholesterol Avoid red meat/ fried foods/ egg yolks/ fatty breakfast meats/ butter, cheese and high fat dairy/ and shellfish    Take care of yourself  Stay active  Call us several days before you want to get your covid tests so so we can give you an address   Stay safe

## 2018-10-11 NOTE — Progress Notes (Signed)
Subjective:    Patient ID: Darren [name], male    DOB: 1945-07-15, 73 y.o.   MRN: [id number]  HPI  Here for health maintenance exam and to review chronic medical problems    Feeling good overall  Taking care of himself   Had amw on 9/15 No gaps or concerns  Had flu shot at pharmacy already Also shingrix vaccine 1/20   Wt Readings from Last 3 Encounters:  10/11/18 208 lb 8 oz (94.6 kg)  10/09/17 210 lb 8 oz (95.5 kg)  10/05/17 210 lb 8 oz (95.5 kg)  wt is down 2 lb  28.48 kg/m  No gym during pandemic  Walking at home and a fit bit  Likes to get outside when he can   Eating healthy -perhaps not as good as before pandemic   Colonoscopy was 2/17 -good   Prostate health Lab Results  Component Value Date   PSA 0.57 10/07/2018   PSA 0.59 10/05/2017   PSA 0.56 09/13/2016   h/o BPH Takes myrbetriq from urology (had TURP in the past) It is still progressive  Also cialis for ED Sees Dr Alinda Money once per year    BP Readings from Last 3 Encounters:  10/11/18 124/86  10/09/17 116/68  10/05/17 122/74   Pulse Readings from Last 3 Encounters:  10/11/18 62  10/09/17 64  10/05/17 (!) 56   H/o elevated glucose level Lab Results  Component Value Date   HGBA1C 5.3 10/07/2018  90 on fasting labs  Stable He tries not to eat a lot of sweets   Hyperlipidemia Lab Results  Component Value Date   CHOL 190 10/07/2018   CHOL 173 10/05/2017   CHOL 211 (H) 09/13/2016   Lab Results  Component Value Date   HDL 63.30 10/07/2018   HDL 53.90 10/05/2017   HDL 57.50 09/13/2016   Lab Results  Component Value Date   LDLCALC 112 (H) 10/07/2018   LDLCALC 104 (H) 10/05/2017   LDLCALC 141 (H) 09/13/2016   Lab Results  Component Value Date   TRIG 76.0 10/07/2018   TRIG 77.0 10/05/2017   TRIG 63.0 09/13/2016   Lab Results  Component Value Date   CHOLHDL 3 10/07/2018   CHOLHDL 3 10/05/2017   CHOLHDL 4 09/13/2016   Lab Results  Component Value Date   LDLDIRECT 127.7  02/15/2009   LDLDIRECT 144.2 12/02/2008   LDLDIRECT 134.0 03/21/2007  not eating quite as well  LDL and HDL are up  Ratio is unchanged  Not a lot of fried foods - occ fried okra  Has been eating more beef lately    Cbc is fairly stable Lab Results  Component Value Date   WBC 3.9 (L) 10/07/2018   HGB 14.5 10/07/2018   HCT 42.5 10/07/2018   MCV 100.2 (H) 10/07/2018   PLT 147.0 (L) 10/07/2018  drinks wine - 2 glasses per day  Baseline for his usual - wbc and plat   Other labs Lab Results  Component Value Date   CREATININE 1.05 10/07/2018   BUN 18 10/07/2018   NA 140 10/07/2018   K 4.0 10/07/2018   CL 104 10/07/2018   CO2 30 10/07/2018   Lab Results  Component Value Date   ALT 15 10/07/2018   AST 15 10/07/2018   ALKPHOS 42 10/07/2018   BILITOT 0.5 10/07/2018   Lab Results  Component Value Date   TSH 3.09 10/07/2018    He will be going out of state again Will need to  be tested with wife Mardene Celeste)  On October 6th before her trip   Patient Active Problem List   Diagnosis Date Noted  . Exposure to communicable disease 08/20/2018  . Elevated glucose level 09/19/2016  . Nasal congestion 09/19/2016  . Encounter for Medicare annual wellness exam 08/18/2014  . Routine general medical examination at a health care facility 08/10/2014  . PERSONAL HISTORY OF COLONIC POLYPS 09/21/2009  . Hyperlipidemia 12/02/2008  . GERD 12/02/2008  . ANXIETY DISORDER, SITUATIONAL, MILD 04/03/2008  . BPH (benign prostatic hyperplasia) 10/22/2006   Past Medical History:  Diagnosis Date  . Basal cell carcinoma    face  . GERD (gastroesophageal reflux disease)   . History of colon polyps   . Hypertrophy of prostate without urinary obstruction and other lower urinary tract symptoms (LUTS)   . Leukocytopenia, unspecified   . Other and unspecified hyperlipidemia    no meds  . Overactive bladder    Past Surgical History:  Procedure Laterality Date  . COLONOSCOPY     multiple - polyps   . PROSTATE SURGERY  08/2012   [facility]  . ROTATOR CUFF REPAIR Right 2003  . UPPER GI ENDOSCOPY    . WISDOM TOOTH EXTRACTION     Social History   Tobacco Use  . Smoking status: Former Smoker    Packs/day: 1.00    Years: 5.00    Pack years: 5.00    Types: Cigarettes    Quit date: 01/24/1968    Years since quitting: 50.7  . Smokeless tobacco: Never Used  Substance Use Topics  . Alcohol use: Yes    Alcohol/week: 14.0 - 21.0 standard drinks    Types: 14 - 21 Glasses of wine per week    Comment: 2-3 glasses wine/day  . Drug use: No   Family History  Problem Relation Age of Onset  . Heart attack Father   . Breast cancer Mother   . Thyroid cancer Maternal Grandmother   . Colon cancer Neg Hx   . Esophageal cancer Neg Hx   . Rectal cancer Neg Hx   . Stomach cancer Neg Hx    Allergies  Allergen Reactions  . Hydrocodone Nausea Only    Hydrocodone caused terrible nausea. Hydrocodone caused terrible nausea.   . Penicillin G Other (See Comments)    Reacted as a child. Unsure what happened.   Marland Kitchen Penicillins     REACTION: allergy as a child   Current Outpatient Medications on File Prior to Visit  Medication Sig Dispense Refill  . ALPRAZolam (XANAX) 0.5 MG tablet Take 1 by mouth up to twice daily for airplane flight 15 tablet 0  . bismuth subsalicylate (PEPTO BISMOL) 262 MG/15ML suspension Take 30 mLs by mouth as needed.    . calcium carbonate (TUMS EX) 750 MG chewable tablet Chew 2-3 tablets by mouth at bedtime as needed for heartburn.    . famotidine (PEPCID) 20 MG tablet Take 40 mg by mouth at bedtime.     . Methylcellulose, Laxative, (CITRUCEL PO) Take 2 tablets by mouth daily.    Marland Kitchen MYRBETRIQ 50 MG TB24 tablet Take 50 mg by mouth daily.  0  . omeprazole (PRILOSEC OTC) 20 MG tablet Take 20 mg by mouth every morning.     . tadalafil (CIALIS) 5 MG tablet Take 5 mg by mouth daily as needed for erectile dysfunction.     No current facility-administered medications on file  prior to visit.     Review of Systems  Constitutional: Negative for activity change, appetite change, fatigue, fever and unexpected weight change.  HENT: Negative for congestion, rhinorrhea, sore throat and trouble swallowing.   Eyes: Negative for pain, redness, itching and visual disturbance.  Respiratory: Negative for cough, chest tightness, shortness of breath and wheezing.   Cardiovascular: Negative for chest pain and palpitations.  Gastrointestinal: Negative for abdominal pain, blood in stool, constipation, diarrhea and nausea.  Endocrine: Negative for cold intolerance, heat intolerance, polydipsia and polyuria.  Genitourinary: Positive for frequency and urgency. Negative for difficulty urinating and dysuria.  Musculoskeletal: Negative for arthralgias, joint swelling and myalgias.  Skin: Negative for pallor and rash.  Neurological: Negative for dizziness, tremors, weakness, numbness and headaches.  Hematological: Negative for adenopathy. Does not bruise/bleed easily.  Psychiatric/Behavioral: Negative for decreased concentration and dysphoric mood. The patient is not nervous/anxious.        Objective:   Physical Exam Constitutional:      General: He is not in acute distress.    Appearance: Normal appearance. He is well-developed and normal weight. He is not ill-appearing or diaphoretic.  HENT:     Head: Normocephalic and atraumatic.     Right Ear: Tympanic membrane, ear canal and external ear normal.     Left Ear: Tympanic membrane, ear canal and external ear normal.     Nose: Nose normal. No congestion.     Mouth/Throat:     Mouth: Mucous membranes are moist.     Pharynx: Oropharynx is clear. No posterior oropharyngeal erythema.  Eyes:     General: No scleral icterus.       Right eye: No discharge.        Left eye: No discharge.     Conjunctiva/sclera: Conjunctivae normal.     Pupils: Pupils are equal, round, and reactive to light.  Neck:     Musculoskeletal: Normal range  of motion and neck supple. No neck rigidity or muscular tenderness.     Thyroid: No thyromegaly.     Vascular: No carotid bruit or JVD.  Cardiovascular:     Rate and Rhythm: Normal rate and regular rhythm.     Pulses: Normal pulses.     Heart sounds: Normal heart sounds. No gallop.   Pulmonary:     Effort: Pulmonary effort is normal. No respiratory distress.     Breath sounds: Normal breath sounds. No wheezing or rales.     Comments: Good air exch Chest:     Chest wall: No tenderness.  Abdominal:     General: Bowel sounds are normal. There is no distension or abdominal bruit.     Palpations: Abdomen is soft. There is no mass.     Tenderness: There is no abdominal tenderness.     Hernia: No hernia is present.  Musculoskeletal:        General: No tenderness.     Right lower leg: No edema.     Left lower leg: No edema.  Lymphadenopathy:     Cervical: No cervical adenopathy.  Skin:    General: Skin is warm and dry.     Coloration: Skin is not pale.     Findings: No erythema or rash.     Comments: Some angiomas and lentigines  Neurological:     Mental Status: He is alert.     Cranial Nerves: No cranial nerve deficit.     Motor: No abnormal muscle tone.     Coordination: Coordination normal.     Gait: Gait normal.     Deep  Tendon Reflexes: Reflexes are normal and symmetric. Reflexes normal.  Psychiatric:        Mood and Affect: Mood normal.        Cognition and Memory: Cognition and memory normal.           Assessment & Plan:   Problem List Items Addressed This Visit      Genitourinary   BPH (benign prostatic hyperplasia)    Continues mybetriq and urology care Lab Results  Component Value Date   PSA 0.57 10/07/2018   PSA 0.59 10/05/2017   PSA 0.56 09/13/2016           Other   Hyperlipidemia    Disc goals for lipids and reasons to control them Rev last labs with pt Rev low sat fat diet in detail LDL is up a bit to 112 HDL excellent in the 60s       Routine general medical examination at a health care facility - Primary    Reviewed health habits including diet and exercise and skin cancer prevention Reviewed appropriate screening tests for age  Also reviewed health mt list, fam hx and immunization status , as well as social and family history   See HPI Labs reviewed  Rev amw-no gaps or concerns Disc optimal diet for hyperlipidemia        Elevated glucose level    Lab Results  Component Value Date   HGBA1C 5.3 10/07/2018   disc imp of low glycemic diet and wt loss to prevent DM2

## 2018-10-13 NOTE — Assessment & Plan Note (Signed)
Continues mybetriq and urology care Lab Results  Component Value Date   PSA 0.57 10/07/2018   PSA 0.59 10/05/2017   PSA 0.56 09/13/2016

## 2018-10-13 NOTE — Assessment & Plan Note (Signed)
Disc goals for lipids and reasons to control them Rev last labs with pt Rev low sat fat diet in detail LDL is up a bit to 112 HDL excellent in the 60s

## 2018-10-13 NOTE — Assessment & Plan Note (Signed)
Reviewed health habits including diet and exercise and skin cancer prevention Reviewed appropriate screening tests for age  Also reviewed health mt list, fam hx and immunization status , as well as social and family history   See HPI Labs reviewed  Rev amw-no gaps or concerns Disc optimal diet for hyperlipidemia

## 2018-10-13 NOTE — Assessment & Plan Note (Signed)
Lab Results  Component Value Date   HGBA1C 5.3 10/07/2018   disc imp of low glycemic diet and wt loss to prevent DM2

## 2018-10-24 ENCOUNTER — Telehealth: Payer: Self-pay

## 2018-10-24 NOTE — Telephone Encounter (Signed)
Patient l/m stating he was suppose to call and ask where and when he and his wife need to go to get VOID 19 tested. Patient stated Dr Glori Bickers told him to call back before Tuesday so he can be advised. I do not see anything mentioned about this in the chart or his wife's chart. They were both tested in July and were negative. I called patient back and had to leave a message to call back to discuss.

## 2018-10-24 NOTE — Telephone Encounter (Signed)
Spoke with patient. Patient states he discussed this with Dr Glori Bickers during his visit in September that when they had to travel again if she would not mind helping them get set up for COVID testing like she did in July and she said that was fine and for him to call back closer to the time they need to be tested to make sure that testing sites have not changed. I advised patient of the new test site for [facility] location and the times. Patient verbalized qunderstanding.

## 2018-10-24 NOTE — Telephone Encounter (Signed)
Thanks for giving them the new address  I will watch for their results

## 2018-10-24 NOTE — Telephone Encounter (Signed)
noted 

## 2018-10-30 ENCOUNTER — Other Ambulatory Visit: Payer: Self-pay | Admitting: *Deleted

## 2018-10-30 DIAGNOSIS — Z20822 Contact with and (suspected) exposure to covid-19: Secondary | ICD-10-CM

## 2018-11-01 LAB — NOVEL CORONAVIRUS, NAA: SARS-CoV-2, NAA: NOT DETECTED

## 2019-02-06 ENCOUNTER — Telehealth: Payer: Self-pay | Admitting: Family Medicine

## 2019-02-06 NOTE — Telephone Encounter (Signed)
Error

## 2019-02-13 ENCOUNTER — Ambulatory Visit: Payer: Medicare PPO | Attending: Internal Medicine

## 2019-02-13 DIAGNOSIS — Z20822 Contact with and (suspected) exposure to covid-19: Secondary | ICD-10-CM

## 2019-02-14 LAB — NOVEL CORONAVIRUS, NAA: SARS-CoV-2, NAA: NOT DETECTED

## 2019-02-18 ENCOUNTER — Ambulatory Visit: Payer: Medicare Other

## 2019-02-27 ENCOUNTER — Ambulatory Visit: Payer: Medicare PPO | Attending: Internal Medicine

## 2019-02-27 DIAGNOSIS — Z23 Encounter for immunization: Secondary | ICD-10-CM

## 2019-02-27 NOTE — Progress Notes (Signed)
   Covid-19 Vaccination Clinic  Name:  Darren [name]    MRN: [id number] DOB: 1945-12-08  02/27/2019  Mr. [name] was observed post Covid-19 immunization for 15 minutes without incidence. He was provided with Vaccine Information Sheet and instruction to access the V-Safe system.   Mr. [name] was instructed to call 911 with any severe reactions post vaccine: Marland Kitchen Difficulty breathing  . Swelling of your face and throat  . A fast heartbeat  . A bad rash all over your body  . Dizziness and weakness    Immunizations Administered    Name Date Dose VIS Date Route   Pfizer COVID-19 Vaccine 02/27/2019  3:31 PM 0.3 mL 01/03/2019 Intramuscular   Manufacturer: [place]   Lot: [id number]   [place]: [id number]

## 2019-03-04 DIAGNOSIS — L570 Actinic keratosis: Secondary | ICD-10-CM | POA: Diagnosis not present

## 2019-03-04 DIAGNOSIS — Z85828 Personal history of other malignant neoplasm of skin: Secondary | ICD-10-CM | POA: Diagnosis not present

## 2019-03-24 ENCOUNTER — Ambulatory Visit: Payer: Medicare PPO | Attending: Internal Medicine

## 2019-03-24 DIAGNOSIS — Z23 Encounter for immunization: Secondary | ICD-10-CM

## 2019-03-24 NOTE — Progress Notes (Signed)
   Covid-19 Vaccination Clinic  Name:  Darren [name]    MRN: [id number] DOB: 07-29-1945  03/24/2019  [name] was observed post Covid-19 immunization for 15 minutes without incidence. He was provided with Vaccine Information Sheet and instruction to access the V-Safe system.   [name] was instructed to call 911 with any severe reactions post vaccine: Marland Kitchen Difficulty breathing  . Swelling of your face and throat  . A fast heartbeat  . A bad rash all over your body  . Dizziness and weakness    Immunizations Administered    Name Date Dose VIS Date Route   Pfizer COVID-19 Vaccine 03/24/2019  5:12 PM 0.3 mL 01/03/2019 Intramuscular   Manufacturer: [place]   Lot: [id number]   [place]: [id number]

## 2019-05-02 DIAGNOSIS — R350 Frequency of micturition: Secondary | ICD-10-CM | POA: Diagnosis not present

## 2019-05-02 DIAGNOSIS — N5201 Erectile dysfunction due to arterial insufficiency: Secondary | ICD-10-CM | POA: Diagnosis not present

## 2019-05-02 DIAGNOSIS — N401 Enlarged prostate with lower urinary tract symptoms: Secondary | ICD-10-CM | POA: Diagnosis not present

## 2019-07-08 ENCOUNTER — Other Ambulatory Visit: Payer: Self-pay

## 2019-07-08 ENCOUNTER — Ambulatory Visit: Payer: Medicare PPO | Admitting: Family Medicine

## 2019-07-08 ENCOUNTER — Encounter: Payer: Self-pay | Admitting: Family Medicine

## 2019-07-08 ENCOUNTER — Ambulatory Visit (INDEPENDENT_AMBULATORY_CARE_PROVIDER_SITE_OTHER)
Admission: RE | Admit: 2019-07-08 | Discharge: 2019-07-08 | Disposition: A | Discharge status: Home / Self Care | Payer: Medicare PPO | Source: Ambulatory Visit | Attending: Family Medicine | Admitting: Family Medicine

## 2019-07-08 DIAGNOSIS — R1031 Right lower quadrant pain: Secondary | ICD-10-CM | POA: Diagnosis not present

## 2019-07-08 DIAGNOSIS — M1611 Unilateral primary osteoarthritis, right hip: Secondary | ICD-10-CM | POA: Diagnosis not present

## 2019-07-08 DIAGNOSIS — R1030 Lower abdominal pain, unspecified: Secondary | ICD-10-CM | POA: Diagnosis not present

## 2019-07-08 NOTE — Assessment & Plan Note (Signed)
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

## 2019-07-08 NOTE — Patient Instructions (Addendum)
Let's check an xray of your hip  We will advise you when reading returns   Make a habit of some stretching after exercise  Also heat if helpful

## 2019-07-08 NOTE — Progress Notes (Signed)
Subjective:    Patient ID: Darren [name], male    DOB: 1945-08-16, 74 y.o.   MRN: [id number]  This visit occurred during the SARS-CoV-2 public health emergency.  Safety protocols were in place, including screening questions prior to the visit, additional usage of staff PPE, and extensive cleaning of exam room while observing appropriate contact time as indicated for disinfecting solutions.    HPI Pt presents with pain in R hip area   Wt Readings from Last 3 Encounters:  07/08/19 213 lb 1 oz (96.6 kg)  10/11/18 208 lb 8 oz (94.6 kg)  10/09/17 210 lb 8 oz (95.5 kg)   29.10 kg/m    2-3 mo ago noted some pain in the R groin  Rad down the thigh   Dull ache  No sharp pain  Once in a while there is a little tingle   Worse in am when he gets up  Sometimes with squatting  Perhaps a little weather related   He walks for exercise - over 10,000 steps per day  No problems when he walks   Not bad enough to take any pain medicine  No ice or heat  No stretching     Has some low back issues  occ does some back exercises Hurts more with heavy lifting   Last MRI of the spine showed deg changes MR Lumbar Spine Wo Contrast (Accession [id number]) (Order [id number]) Imaging Date: 02/13/2010 Department: [facility] Released By: (auto-released) Authorizing: Landin Tallon, Wynelle Fanny, MD  Exam Status  Status  Final [99]  PACS Intelerad Image Link  Show images for MR Lumbar Spine Wo Contrast Study Result  Narrative  Clinical Data: 74 year old male with back pain without significant lower extremity radiation.   MRI LUMBAR SPINE WITHOUT CONTRAST:  Technique: Multiplanar and multiecho pulse sequences of the lumbar spine, to include the lower thoracic and upper sacral regions, were obtained according to standard protocol without IV contrast.  Comparison: None.   Findings: Normal signal is present in the conus medullaris which terminates at T12-L1. Endplate  marrow changes are seen to the right at L5-S1. A 65mm hemangioma is noted at L5. A smaller hemangioma is seen at the superior endplate of L1 and within the S2 segment of the sacrum. There is some straightening of the normal lumbar lordosis. Individual disc levels are as follows:   The disc levels at L1-2 and above are normal.   L2-3: There is loss of normal disc signal. There is eccentric left disc bulging with an associated annular tear. This results in minimal narrowing of the left lateral recess and foramen.   L3-4: Mild disc bulging is present. There is some facet hypertrophy. Mild biforaminal narrowing results.   L4-5: Mild broad base disc bulges eccentric to the right. There is minimal narrowing of the lateral recess, right greater than left. Mild biforaminal narrowing is on the basis of congenitally short pedicles.   L5-S1: Mild broad base disc bulging is present. This potentially contacts the right L5 nerve root within the lateral recess.   IMPRESSION:   1. Mild lateral recess and foraminal narrowing at L2-3, L3-4, and L4-5 due to minimal disc bulging and facet hypertrophy in the setting of congenitally short pedicles.   2. No focal moderate or severe stenosis.   Provider: Weyman Rodney     Review of Systems     Objective:   Physical Exam Constitutional:      General: He is not  in acute distress.    Appearance: Normal appearance. He is not ill-appearing.     Comments: overweight  Eyes:     General: No scleral icterus.    Conjunctiva/sclera: Conjunctivae normal.     Pupils: Pupils are equal, round, and reactive to light.  Cardiovascular:     Rate and Rhythm: Normal rate and regular rhythm.  Pulmonary:     Effort: Pulmonary effort is normal. No respiratory distress.     Breath sounds: Normal breath sounds. No wheezing or rales.  Abdominal:     General: Abdomen is flat. Bowel sounds are normal.     Palpations: Abdomen is soft. There is no mass.     Tenderness: There is  no abdominal tenderness.     Hernia: No hernia is present.  Musculoskeletal:     Right hip: No deformity, bony tenderness or crepitus. Decreased range of motion. Normal strength.     Right lower leg: No edema.     Left lower leg: No edema.     Comments: R hip- pain with internal rotation (limited by pain) Nl ext rotation  No crepitus Can flex fully while lying down Nl gait  Mild tenderness of R greater trochanter    Skin:    General: Skin is warm and dry.     Findings: No erythema or rash.  Neurological:     Mental Status: He is alert.     Sensory: No sensory deficit.     Coordination: Coordination normal.     Deep Tendon Reflexes: Reflexes normal.  Psychiatric:        Mood and Affect: Mood normal.           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

## 2019-09-25 DIAGNOSIS — Z961 Presence of intraocular lens: Secondary | ICD-10-CM | POA: Diagnosis not present

## 2019-10-09 ENCOUNTER — Telehealth: Payer: Self-pay | Admitting: Family Medicine

## 2019-10-09 ENCOUNTER — Ambulatory Visit: Payer: Medicare Other

## 2019-10-09 DIAGNOSIS — R7309 Other abnormal glucose: Secondary | ICD-10-CM

## 2019-10-09 DIAGNOSIS — E7800 Pure hypercholesterolemia, unspecified: Secondary | ICD-10-CM

## 2019-10-09 DIAGNOSIS — Z0000 Encounter for general adult medical examination without abnormal findings: Secondary | ICD-10-CM

## 2019-10-09 DIAGNOSIS — N400 Enlarged prostate without lower urinary tract symptoms: Secondary | ICD-10-CM

## 2019-10-09 NOTE — Telephone Encounter (Signed)
-----   Message from Cloyd Stagers, RT sent at 09/23/2019  1:50 PM EDT ----- Regarding: Lab Orders for Friday 9.17.2021 Please place lab orders for  Friday 9.17.2021, office visit for physical on Tuesday 9.21.2021 Thank you, Dyke Maes RT(R)

## 2019-10-10 ENCOUNTER — Ambulatory Visit (INDEPENDENT_AMBULATORY_CARE_PROVIDER_SITE_OTHER): Payer: Medicare PPO

## 2019-10-10 ENCOUNTER — Other Ambulatory Visit: Payer: Self-pay

## 2019-10-10 ENCOUNTER — Other Ambulatory Visit (INDEPENDENT_AMBULATORY_CARE_PROVIDER_SITE_OTHER): Payer: Medicare PPO

## 2019-10-10 DIAGNOSIS — R7309 Other abnormal glucose: Secondary | ICD-10-CM

## 2019-10-10 DIAGNOSIS — Z0000 Encounter for general adult medical examination without abnormal findings: Secondary | ICD-10-CM

## 2019-10-10 DIAGNOSIS — E7800 Pure hypercholesterolemia, unspecified: Secondary | ICD-10-CM | POA: Diagnosis not present

## 2019-10-10 DIAGNOSIS — N400 Enlarged prostate without lower urinary tract symptoms: Secondary | ICD-10-CM | POA: Diagnosis not present

## 2019-10-10 LAB — COMPREHENSIVE METABOLIC PANEL
ALT: 14 U/L (ref 0–53)
AST: 16 U/L (ref 0–37)
Albumin: 4.1 g/dL (ref 3.5–5.2)
Alkaline Phosphatase: 39 U/L (ref 39–117)
BUN: 18 mg/dL (ref 6–23)
CO2: 30 mEq/L (ref 19–32)
Calcium: 9.4 mg/dL (ref 8.4–10.5)
Chloride: 105 mEq/L (ref 96–112)
Creatinine, Ser: 0.98 mg/dL (ref 0.40–1.50)
GFR: 74.63 mL/min (ref >60.00)
Glucose, Bld: 92 mg/dL (ref 70–99)
Potassium: 4.2 mEq/L (ref 3.5–5.1)
Sodium: 140 mEq/L (ref 135–145)
Total Bilirubin: 0.7 mg/dL (ref 0.2–1.2)
Total Protein: 6.1 g/dL (ref 6.0–8.3)

## 2019-10-10 LAB — CBC WITH DIFFERENTIAL/PLATELET
Basophils Absolute: 0 10*3/uL (ref 0.0–0.1)
Basophils Relative: 1 % (ref 0.0–3.0)
Eosinophils Absolute: 0.2 10*3/uL (ref 0.0–0.7)
Eosinophils Relative: 3.8 % (ref 0.0–5.0)
HCT: 40.7 % (ref 39.0–52.0)
Hemoglobin: 13.9 g/dL (ref 13.0–17.0)
Lymphocytes Relative: 31 % (ref 12.0–46.0)
Lymphs Abs: 1.2 10*3/uL (ref 0.7–4.0)
MCHC: 34.1 g/dL (ref 30.0–36.0)
MCV: 99.5 fl (ref 78.0–100.0)
Monocytes Absolute: 0.5 10*3/uL (ref 0.1–1.0)
Monocytes Relative: 12.5 % — ABNORMAL HIGH (ref 3.0–12.0)
Neutro Abs: 2 10*3/uL (ref 1.4–7.7)
Neutrophils Relative %: 51.7 % (ref 43.0–77.0)
Platelets: 148 10*3/uL — ABNORMAL LOW (ref 150.0–400.0)
RBC: 4.09 Mil/uL — ABNORMAL LOW (ref 4.22–5.81)
RDW: 12.2 % (ref 11.5–15.5)
WBC: 3.9 10*3/uL — ABNORMAL LOW (ref 4.0–10.5)

## 2019-10-10 LAB — LIPID PANEL
Cholesterol: 183 mg/dL (ref 0–200)
HDL: 58.2 mg/dL (ref >39.00)
LDL Cholesterol: 110 mg/dL — ABNORMAL HIGH (ref 0–99)
NonHDL: 125.12
Total CHOL/HDL Ratio: 3
Triglycerides: 78 mg/dL (ref 0.0–149.0)
VLDL: 15.6 mg/dL (ref 0.0–40.0)

## 2019-10-10 LAB — PSA, MEDICARE: PSA: 0.52 ng/ml (ref 0.10–4.00)

## 2019-10-10 LAB — TSH: TSH: 3.11 u[IU]/mL (ref 0.35–4.50)

## 2019-10-10 LAB — HEMOGLOBIN A1C: Hgb A1c MFr Bld: 5.5 % (ref 4.6–6.5)

## 2019-10-10 NOTE — Progress Notes (Signed)
PCP notes:  Health Maintenance: Flu- due Tdap- insurance Shingrix- #2 due   Abnormal Screenings: none   Patient concerns: Wants to discuss whether or not he should have the MMR vaccine.   Nurse concerns: none   Next PCP appt: 10/14/2019 @ 10:30 am   

## 2019-10-10 NOTE — Progress Notes (Signed)
Subjective:   Darren [name] is a 74 y.o. male who presents for Medicare Annual/Subsequent preventive examination.  Review of Systems: N/A      I connected with the patient today by telephone and verified that I am speaking with the correct person using two identifiers. Location patient: home Location nurse: work Persons participating in the telephone visit: patient, nurse.   I discussed the limitations, risks, security and privacy concerns of performing an evaluation and management service by telephone and the availability of in person appointments. I also discussed with the patient that there may be a patient responsible charge related to this service. The patient expressed understanding and verbally consented to this telephonic visit.        Cardiac Risk Factors include: advanced age (>59men, >76 women);male gender;dyslipidemia     Objective:    Today's Vitals   There is no height or weight on file to calculate BMI.  Advanced Directives 10/10/2019 10/08/2018 10/05/2017 06/19/2017 09/17/2015 02/17/2015  Does Patient Have a Medical Advance Directive? Yes Yes Yes Yes Yes Yes  Type of Paramedic of [place];Living will [place];Living will [place];Living will [place];Living will [place];Living will [place];Living will  Does patient want to make changes to medical advance directive? - - - No - Patient declined No - Patient declined -  Copy of [place] in Chart? No - copy requested No - copy requested No - copy requested No - copy requested No - copy requested -    Current Medications (verified) Outpatient Encounter Medications as of 10/10/2019  Medication Sig  . ALPRAZolam (XANAX) 0.5 MG tablet Take 1 by mouth up to twice daily for airplane flight  . bismuth subsalicylate (PEPTO BISMOL) 262 MG/15ML suspension Take 30 mLs by mouth as  needed.  . calcium carbonate (TUMS EX) 750 MG chewable tablet Chew 2-3 tablets by mouth at bedtime as needed for heartburn.  . famotidine (PEPCID) 20 MG tablet Take 40 mg by mouth at bedtime.   . Methylcellulose, Laxative, (CITRUCEL PO) Take 2 tablets by mouth daily.  Marland Kitchen MYRBETRIQ 50 MG TB24 tablet Take 50 mg by mouth daily.  Marland Kitchen omeprazole (PRILOSEC OTC) 20 MG tablet Take 20 mg by mouth every morning.   . tadalafil (CIALIS) 5 MG tablet Take 5 mg by mouth daily as needed for erectile dysfunction.   No facility-administered encounter medications on file as of 10/10/2019.    Allergies (verified) Hydrocodone, Penicillin g, and Penicillins   History: Past Medical History:  Diagnosis Date  . Basal cell carcinoma    face  . GERD (gastroesophageal reflux disease)   . History of colon polyps   . Hypertrophy of prostate without urinary obstruction and other lower urinary tract symptoms (LUTS)   . Leukocytopenia, unspecified   . Other and unspecified hyperlipidemia    no meds  . Overactive bladder    Past Surgical History:  Procedure Laterality Date  . COLONOSCOPY     multiple - polyps  . PROSTATE SURGERY  08/2012   [facility]  at [facility]  . ROTATOR CUFF REPAIR Right 2003  . UPPER GI ENDOSCOPY    . WISDOM TOOTH EXTRACTION     Family History  Problem Relation Age of Onset  . Heart attack Father   . Breast cancer Mother   . Thyroid cancer Maternal Grandmother   . Colon cancer Neg Hx   . Esophageal cancer Neg Hx   .  Rectal cancer Neg Hx   . Stomach cancer Neg Hx    Social History   Socioeconomic History  . Marital status: Married    Spouse name: Not on file  . Number of children: 1  . Years of education: Not on file  . Highest education level: Not on file  Occupational History  . Occupation: retired college professor  Tobacco Use  . Smoking status: Former Smoker    Packs/day: 1.00    Years: 5.00    Pack years: 5.00    Types: Cigarettes    Quit date: 01/24/1968    Years since  quitting: 51.7  . Smokeless tobacco: Never Used  Vaping Use  . Vaping Use: Never used  Substance and Sexual Activity  . Alcohol use: Yes    Alcohol/week: 14.0 - 21.0 standard drinks    Types: 14 - 21 Glasses of wine per week    Comment: 2-3 glasses wine/day  . Drug use: No  . Sexual activity: Yes  Other Topics Concern  . Not on file  Social History Narrative  . Not on file   Social Determinants of Health   Financial Resource Strain: Low Risk   . Difficulty of Paying Living Expenses: Not hard at all  Food Insecurity: No Food Insecurity  . Worried About Charity fundraiser in the Last Year: Never true  . Ran Out of Food in the Last Year: Never true  Transportation Needs: No Transportation Needs  . Lack of Transportation (Medical): No  . Lack of Transportation (Non-Medical): No  Physical Activity: Sufficiently Active  . Days of Exercise per Week: 7 days  . Minutes of Exercise per Session: 30 min  Stress: No Stress Concern Present  . Feeling of Stress : Not at all  Social Connections:   . Frequency of Communication with Friends and Family: Not on file  . Frequency of Social Gatherings with Friends and Family: Not on file  . Attends Religious Services: Not on file  . Active Member of Clubs or Organizations: Not on file  . Attends Archivist Meetings: Not on file  . Marital Status: Not on file    Tobacco Counseling Counseling given: Not Answered   Clinical Intake:  Pre-visit preparation completed: Yes  Pain : No/denies pain     Nutritional Risks: None Diabetes: No  How often do you need to have someone help you when you read instructions, pamphlets, or other written materials from your doctor or pharmacy?: 1 - Never What is the last grade level you completed in school?: PhD  Diabetic: N/A Nutrition Risk Assessment:  Has the patient had any N/V/D within the last 2 months?  No  Does the patient have any non-healing wounds?  No  Has the patient had any  unintentional weight loss or weight gain?  No   Diabetes:  Is the patient diabetic?  No  If diabetic, was a CBG obtained today?  N/A Did the patient bring in their glucometer from home?  N/A How often do you monitor your CBG's? N/A.   Financial Strains and Diabetes Management:  Are you having any financial strains with the device, your supplies or your medication? N/A.  Does the patient want to be seen by Chronic Care Management for management of their diabetes?  N/A Would the patient like to be referred to a Nutritionist or for Diabetic Management?  N/A     Interpreter Needed?: No  Information entered by :: CJohnson, LPN   Activities of  Daily Living In your present state of health, do you have any difficulty performing the following activities: 10/10/2019  Hearing? N  Vision? N  Difficulty concentrating or making decisions? N  Walking or climbing stairs? N  Dressing or bathing? N  Doing errands, shopping? N  Preparing Food and eating ? N  Using the Toilet? N  In the past six months, have you accidently leaked urine? N  Do you have problems with loss of bowel control? N  Managing your Medications? N  Managing your Finances? N  Housekeeping or managing your Housekeeping? N  Some recent data might be hidden    Patient Care Team: Tower, Wynelle Fanny, MD as PCP - General  Indicate any recent Medical Services you may have received from other than [facility] providers in the past year (date may be approximate).     Assessment:   This is a routine wellness examination for [place].  Hearing/Vision screen  Hearing Screening   125Hz  250Hz  500Hz  1000Hz  2000Hz  3000Hz  4000Hz  6000Hz  8000Hz   Right ear:           Left ear:           Vision Screening Comments: Patient gets annual eye exams   Dietary issues and exercise activities discussed: Current Exercise Habits: Home exercise routine, Type of exercise: walking, Time (Minutes): 30, Frequency (Times/Week): 7, Weekly Exercise  (Minutes/Week): 210, Intensity: Moderate, Exercise limited by: None identified  Goals    . Increase physical activity     Starting 10/05/2017, I will continue to exercise for at least 60 min 5-6 days per week.     . Patient Stated     10/08/18, Patient wants to get his cataract surgery done to improve his vision.    . Patient Stated     10/10/2019, I will continue to walk daily for about 10,000 steps.       Depression Screen PHQ 2/9 Scores 10/10/2019 10/08/2018 10/05/2017 09/19/2016 09/17/2015 08/18/2014 05/13/2013  PHQ - 2 Score 0 0 0 0 0 0 0  PHQ- 9 Score 0 0 0 - - - -    Fall Risk Fall Risk  10/10/2019 10/08/2018 10/05/2017 09/19/2016 09/17/2015  Falls in the past year? 0 0 No No No  Number falls in past yr: 0 0 - - -  Injury with Fall? 0 - - - -  Risk for fall due to : No Fall Risks Medication side effect - - -  Follow up Falls evaluation completed;Falls prevention discussed Falls evaluation completed;Falls prevention discussed - - -    Any stairs in or around the home? Yes  If so, are there any without handrails? No  Home free of loose throw rugs in walkways, pet beds, electrical cords, etc? Yes  Adequate lighting in your home to reduce risk of falls? Yes   ASSISTIVE DEVICES UTILIZED TO PREVENT FALLS:  Life alert? No  Use of a cane, walker or w/c? No  Grab bars in the bathroom? No  Shower chair or bench in shower? No  Elevated toilet seat or a handicapped toilet? No   TIMED UP AND GO:  Was the test performed? N/A, telephonic visit .    Cognitive Function: MMSE - Mini Mental State Exam 10/10/2019 10/08/2018 10/05/2017 09/17/2015  Orientation to time 5 5 5 5   Orientation to Place 5 5 5 5   Registration 3 3 3 3   Attention/ Calculation 5 5 0 0  Recall 3 3 3 3   Language- name 2 objects - - 0  0  Language- repeat 1 1 1 1   Language- follow 3 step command - - 3 3  Language- read & follow direction - - 0 0  Write a sentence - - 0 0  Copy design - - 0 0  Total score - - 20 20  Mini  Cog  Mini-Cog screen was completed. Maximum score is 22. A value of 0 denotes this part of the MMSE was not completed or the patient failed this part of the Mini-Cog screening.        Immunizations Immunization History  Administered Date(s) Administered  . Fluad Quad(high Dose 65+) 10/01/2018  . H1N1 02/06/2008  . Hepatitis A 11/26/2003, 06/24/2004  . Influenza Split 10/26/2010, 10/23/2011  . Influenza Whole 12/15/2003, 11/16/2006, 10/27/2008, 10/14/2009  . Influenza, High Dose Seasonal PF 09/24/2018  . Influenza,inj,Quad PF,6+ Mos 10/09/2012, 10/29/2013, 10/13/2014, 10/29/2015, 11/10/2016, 10/05/2017  . PFIZER SARS-COV-2 Vaccination 02/27/2019, 03/24/2019  . Pneumococcal Conjugate-13 09/17/2015  . Pneumococcal Polysaccharide-23 09/09/2007, 05/13/2013  . Td 07/29/1998, 12/02/2008  . Zoster 09/28/2005  . Zoster Recombinat (Shingrix) 02/11/2018    TDAP status: Due, Education has been provided regarding the importance of this vaccine. Advised may receive this vaccine at local pharmacy or Health Dept. Aware to provide a copy of the vaccination record if obtained from local pharmacy or Health Dept. Verbalized acceptance and understanding. Flu Vaccine status: due, will get at office visit  Pneumococcal vaccine status: Up to date Covid-19 vaccine status: Completed vaccines  Qualifies for Shingles Vaccine? Yes   Zostavax completed: Yes   Shingrix Completed:#2 due, will get at upcoming office visit   Screening Tests Health Maintenance  Topic Date Due  . TETANUS/TDAP  12/03/2018  . INFLUENZA VACCINE  08/24/2019  . COLONOSCOPY  03/01/2025  . COVID-19 Vaccine  Completed  . Hepatitis C Screening  Completed  . PNA vac Low Risk Adult  Completed    Health Maintenance  Health Maintenance Due  Topic Date Due  . TETANUS/TDAP  12/03/2018  . INFLUENZA VACCINE  08/24/2019    Colorectal cancer screening: Completed 03/02/2015. Repeat every 10 years  Lung Cancer Screening: (Low Dose CT  Chest recommended if Age 12-80 years, 30 pack-year currently smoking OR have quit w/in 15 years.) does not qualify.    Additional Screening:  Hepatitis C Screening: does qualify; Completed 09/17/2015  Vision Screening: Recommended annual ophthalmology exams for early detection of glaucoma and other disorders of the eye. Is the patient up to date with their annual eye exam?  Yes  Who is the provider or what is the name of the office in which the patient attends annual eye exams? Dr. Glennon Mac If pt is not established with a provider, would they like to be referred to a provider to establish care? No .   Dental Screening: Recommended annual dental exams for proper oral hygiene  Community Resource Referral / Chronic Care Management: CRR required this visit?  No   CCM required this visit?  No      Plan:     I have personally reviewed and noted the following in the patient's chart:   . Medical and social history . Use of alcohol, tobacco or illicit drugs  . Current medications and supplements . Functional ability and status . Nutritional status . Physical activity . Advanced directives . List of other physicians . Hospitalizations, surgeries, and ER visits in previous 12 months . Vitals . Screenings to include cognitive, depression, and falls . Referrals and appointments  In addition, I have reviewed  and discussed with patient certain preventive protocols, quality metrics, and best practice recommendations. A written personalized care plan for preventive services as well as general preventive health recommendations were provided to patient.   Due to this being a telephonic visit, the after visit summary with patients personalized plan was offered to patient via office or my-chart.  Patient preferred to pick up at office at next visit or access or mychart.   Andrez Grime, LPN   [id number]

## 2019-10-10 NOTE — Patient Instructions (Signed)
Darren [name] , Thank you for taking time to come for your Medicare Wellness Visit. I appreciate your ongoing commitment to your health goals. Please review the following plan we discussed and let me know if I can assist you in the future.   Screening recommendations/referrals: Colonoscopy: Up to date, completed 03/02/2015, due 02/2025 Recommended yearly ophthalmology/optometry visit for glaucoma screening and checkup Recommended yearly dental visit for hygiene and checkup  Vaccinations: Influenza vaccine: due, will get at upcoming office visit  Pneumococcal vaccine: Completed series Tdap vaccine: decline- insurance Shingles vaccine: #2 due, will get at upcoming office visit    Covid-19: Completed series  Advanced directives: Please bring a copy of your POA ([employer] [place]) and/or Living Will to your next appointment.   Conditions/risks identified: hyperlipidemia  Next appointment: Follow up in one year for your annual wellness visit.   Preventive Care 43 Years and Older, Male Preventive care refers to lifestyle choices and visits with your health care provider that can promote health and wellness. What does preventive care include?  A yearly physical exam. This is also called an annual well check.  Dental exams once or twice a year.  Routine eye exams. Ask your health care provider how often you should have your eyes checked.  Personal lifestyle choices, including:  Daily care of your teeth and gums.  Regular physical activity.  Eating a healthy diet.  Avoiding tobacco and drug use.  Limiting alcohol use.  Practicing safe sex.  Taking low doses of aspirin every day.  Taking vitamin and mineral supplements as recommended by your health care provider. What happens during an annual well check? The services and screenings done by your health care provider during your annual well check will depend on your age, overall health, lifestyle risk factors, and family history of  disease. Counseling  Your health care provider may ask you questions about your:  Alcohol use.  Tobacco use.  Drug use.  Emotional well-being.  Home and relationship well-being.  Sexual activity.  Eating habits.  History of falls.  Memory and ability to understand (cognition).  Work and work Statistician. Screening  You may have the following tests or measurements:  Height, weight, and BMI.  Blood pressure.  Lipid and cholesterol levels. These may be checked every 5 years, or more frequently if you are over 26 years old.  Skin check.  Lung cancer screening. You may have this screening every year starting at age 57 if you have a 30-pack-year history of smoking and currently smoke or have quit within the past 15 years.  Fecal occult blood test (FOBT) of the stool. You may have this test every year starting at age 31.  Flexible sigmoidoscopy or colonoscopy. You may have a sigmoidoscopy every 5 years or a colonoscopy every 10 years starting at age 73.  Prostate cancer screening. Recommendations will vary depending on your family history and other risks.  Hepatitis C blood test.  Hepatitis B blood test.  Sexually transmitted disease (STD) testing.  Diabetes screening. This is done by checking your blood sugar (glucose) after you have not eaten for a while (fasting). You may have this done every 1-3 years.  Abdominal aortic aneurysm (AAA) screening. You may need this if you are a current or former smoker.  Osteoporosis. You may be screened starting at age 2 if you are at high risk. Talk with your health care provider about your test results, treatment options, and if necessary, the need for more tests. Vaccines  Your  health care provider may recommend certain vaccines, such as:  Influenza vaccine. This is recommended every year.  Tetanus, diphtheria, and acellular pertussis (Tdap, Td) vaccine. You may need a Td booster every 10 years.  Zoster vaccine. You may  need this after age 61.  Pneumococcal 13-valent conjugate (PCV13) vaccine. One dose is recommended after age 47.  Pneumococcal polysaccharide (PPSV23) vaccine. One dose is recommended after age 73. Talk to your health care provider about which screenings and vaccines you need and how often you need them. This information is not intended to replace advice given to you by your health care provider. Make sure you discuss any questions you have with your health care provider. Document Released: 02/05/2015 Document Revised: 09/29/2015 Document Reviewed: 11/10/2014 Elsevier Interactive Patient Education  2017 [place] Prevention in the Home Falls can cause injuries. They can happen to people of all ages. There are many things you can do to make your home safe and to help prevent falls. What can I do on the outside of my home?  Regularly fix the edges of walkways and driveways and fix any cracks.  Remove anything that might make you trip as you walk through a door, such as a raised step or threshold.  Trim any bushes or trees on the path to your home.  Use bright outdoor lighting.  Clear any walking paths of anything that might make someone trip, such as rocks or tools.  Regularly check to see if handrails are loose or broken. Make sure that both sides of any steps have handrails.  Any raised decks and porches should have guardrails on the edges.  Have any leaves, snow, or ice cleared regularly.  Use sand or salt on walking paths during winter.  Clean up any spills in your garage right away. This includes oil or grease spills. What can I do in the bathroom?  Use night lights.  Install grab bars by the toilet and in the tub and shower. Do not use towel bars as grab bars.  Use non-skid mats or decals in the tub or shower.  If you need to sit down in the shower, use a plastic, non-slip stool.  Keep the floor dry. Clean up any water that spills on the floor as soon as it  happens.  Remove soap buildup in the tub or shower regularly.  Attach bath mats securely with double-sided non-slip rug tape.  Do not have throw rugs and other things on the floor that can make you trip. What can I do in the bedroom?  Use night lights.  Make sure that you have a light by your bed that is easy to reach.  Do not use any sheets or blankets that are too big for your bed. They should not hang down onto the floor.  Have a firm chair that has side arms. You can use this for support while you get dressed.  Do not have throw rugs and other things on the floor that can make you trip. What can I do in the kitchen?  Clean up any spills right away.  Avoid walking on wet floors.  Keep items that you use a lot in easy-to-reach places.  If you need to reach something above you, use a strong step stool that has a grab bar.  Keep electrical cords out of the way.  Do not use floor polish or wax that makes floors slippery. If you must use wax, use non-skid floor wax.  Do not  have throw rugs and other things on the floor that can make you trip. What can I do with my stairs?  Do not leave any items on the stairs.  Make sure that there are handrails on both sides of the stairs and use them. Fix handrails that are broken or loose. Make sure that handrails are as long as the stairways.  Check any carpeting to make sure that it is firmly attached to the stairs. Fix any carpet that is loose or worn.  Avoid having throw rugs at the top or bottom of the stairs. If you do have throw rugs, attach them to the floor with carpet tape.  Make sure that you have a light switch at the top of the stairs and the bottom of the stairs. If you do not have them, ask someone to add them for you. What else can I do to help prevent falls?  Wear shoes that:  Do not have high heels.  Have rubber bottoms.  Are comfortable and fit you well.  Are closed at the toe. Do not wear sandals.  If you  use a stepladder:  Make sure that it is fully opened. Do not climb a closed stepladder.  Make sure that both sides of the stepladder are locked into place.  Ask someone to hold it for you, if possible.  Clearly mark and make sure that you can see:  Any grab bars or handrails.  First and last steps.  Where the edge of each step is.  Use tools that help you move around (mobility aids) if they are needed. These include:  Canes.  Walkers.  Scooters.  Crutches.  Turn on the lights when you go into a dark area. Replace any light bulbs as soon as they burn out.  Set up your furniture so you have a clear path. Avoid moving your furniture around.  If any of your floors are uneven, fix them.  If there are any pets around you, be aware of where they are.  Review your medicines with your doctor. Some medicines can make you feel dizzy. This can increase your chance of falling. Ask your doctor what other things that you can do to help prevent falls. This information is not intended to replace advice given to you by your health care provider. Make sure you discuss any questions you have with your health care provider. Document Released: 11/05/2008 Document Revised: 06/17/2015 Document Reviewed: 02/13/2014 Elsevier Interactive Patient Education  2017 [employer].

## 2019-10-14 ENCOUNTER — Ambulatory Visit (INDEPENDENT_AMBULATORY_CARE_PROVIDER_SITE_OTHER): Payer: Medicare PPO | Admitting: Family Medicine

## 2019-10-14 ENCOUNTER — Other Ambulatory Visit: Payer: Self-pay

## 2019-10-14 ENCOUNTER — Encounter: Payer: Self-pay | Admitting: Family Medicine

## 2019-10-14 VITALS — BP 130/80 | HR 69 | Temp 96.9°F | Ht 71.75 in | Wt 210.1 lb

## 2019-10-14 DIAGNOSIS — E7800 Pure hypercholesterolemia, unspecified: Secondary | ICD-10-CM | POA: Diagnosis not present

## 2019-10-14 DIAGNOSIS — R7309 Other abnormal glucose: Secondary | ICD-10-CM | POA: Diagnosis not present

## 2019-10-14 DIAGNOSIS — Z23 Encounter for immunization: Secondary | ICD-10-CM

## 2019-10-14 DIAGNOSIS — N400 Enlarged prostate without lower urinary tract symptoms: Secondary | ICD-10-CM

## 2019-10-14 DIAGNOSIS — Z0000 Encounter for general adult medical examination without abnormal findings: Secondary | ICD-10-CM | POA: Diagnosis not present

## 2019-10-14 DIAGNOSIS — K219 Gastro-esophageal reflux disease without esophagitis: Secondary | ICD-10-CM

## 2019-10-14 NOTE — Assessment & Plan Note (Signed)
Disc goals for lipids and reasons to control them Rev last labs with pt Rev low sat fat diet in detail LDL down slt  Good HDL  Fairly stable

## 2019-10-14 NOTE — Assessment & Plan Note (Signed)
Lab Results  Component Value Date   HGBA1C 5.5 10/10/2019   disc imp of low glycemic diet and wt loss to prevent DM2

## 2019-10-14 NOTE — Progress Notes (Signed)
Subjective:    Patient ID: Darren [name], male    DOB: 09-15-1945, 74 y.o.   MRN: [id number]  This visit occurred during the SARS-CoV-2 public health emergency.  Safety protocols were in place, including screening questions prior to the visit, additional usage of staff PPE, and extensive cleaning of exam room while observing appropriate contact time as indicated for disinfecting solutions.    HPI  Here for health maintenance exam and to review chronic medical problems    Wt Readings from Last 3 Encounters:  10/14/19 210 lb 1 oz (95.3 kg)  07/08/19 213 lb 1 oz (96.6 kg)  10/11/18 208 lb 8 oz (94.6 kg)   28.69 kg/m   He had amw on 9/17  Noted need for flu shot- which he had today covid status -vaccinated pfizer    Pt had question about measles vaccine  He had the measles when he was a child - disc probable immunity   Colonoscopy 2/17  H/o BPH Lab Results  Component Value Date   PSA 0.52 10/10/2019   PSA 0.57 10/07/2018   PSA 0.59 10/05/2017  he takes myrbetriq-is helpful  Also cialis from urology- Dr Alinda Money (last visit in April)  No clinical changes  Does have nocturia   H/o GERD- still takes omeprazole and pepcid (sees Dr Ardis Hughs) occ tums  Watches diet   BP Readings from Last 3 Encounters:  10/14/19 130/80  07/08/19 130/70  10/11/18 124/86   Pulse Readings from Last 3 Encounters:  10/14/19 69  07/08/19 82  10/11/18 62   Walking for exercise  Wants to go back to the gym  (went for a little while until the pandemic picked up again) Uses fitbit -at least 10,000 steps per day   Elevated glucose in the past Lab Results  Component Value Date   HGBA1C 5.5 10/10/2019  last time 5.3 Good    Hyperlipidemia Lab Results  Component Value Date   CHOL 183 10/10/2019   CHOL 190 10/07/2018   CHOL 173 10/05/2017   Lab Results  Component Value Date   HDL 58.20 10/10/2019   HDL 63.30 10/07/2018   HDL 53.90 10/05/2017   Lab Results  Component Value  Date   LDLCALC 110 (H) 10/10/2019   LDLCALC 112 (H) 10/07/2018   LDLCALC 104 (H) 10/05/2017   Lab Results  Component Value Date   TRIG 78.0 10/10/2019   TRIG 76.0 10/07/2018   TRIG 77.0 10/05/2017   Lab Results  Component Value Date   CHOLHDL 3 10/10/2019   CHOLHDL 3 10/07/2018   CHOLHDL 3 10/05/2017   Lab Results  Component Value Date   LDLDIRECT 127.7 02/15/2009   LDLDIRECT 144.2 12/02/2008   LDLDIRECT 134.0 03/21/2007   Fairly stable  LDL is down    platelets tend to be in high 140s Lab Results  Component Value Date   WBC 3.9 (L) 10/10/2019   HGB 13.9 10/10/2019   HCT 40.7 10/10/2019   MCV 99.5 10/10/2019   PLT 148.0 (L) 10/10/2019    Lab Results  Component Value Date   CREATININE 0.98 10/10/2019   BUN 18 10/10/2019   NA 140 10/10/2019   K 4.2 10/10/2019   CL 105 10/10/2019   CO2 30 10/10/2019   Lab Results  Component Value Date   ALT 14 10/10/2019   AST 16 10/10/2019   ALKPHOS 39 10/10/2019   BILITOT 0.7 10/10/2019   Lab Results  Component Value Date   TSH 3.11 10/10/2019  Patient Active Problem List   Diagnosis Date Noted  . Right groin pain 07/08/2019  . Exposure to communicable disease 08/20/2018  . Elevated glucose level 09/19/2016  . Nasal congestion 09/19/2016  . Encounter for Medicare annual wellness exam 08/18/2014  . Routine general medical examination at a health care facility 08/10/2014  . PERSONAL HISTORY OF COLONIC POLYPS 09/21/2009  . Hyperlipidemia 12/02/2008  . GERD 12/02/2008  . ANXIETY DISORDER, SITUATIONAL, MILD 04/03/2008  . BPH (benign prostatic hyperplasia) 10/22/2006   Past Medical History:  Diagnosis Date  . Basal cell carcinoma    face  . GERD (gastroesophageal reflux disease)   . History of colon polyps   . Hypertrophy of prostate without urinary obstruction and other lower urinary tract symptoms (LUTS)   . Leukocytopenia, unspecified   . Other and unspecified hyperlipidemia    no meds  . Overactive  bladder    Past Surgical History:  Procedure Laterality Date  . COLONOSCOPY     multiple - polyps  . PROSTATE SURGERY  08/2012   [facility]  at [facility]  . ROTATOR CUFF REPAIR Right 2003  . UPPER GI ENDOSCOPY    . WISDOM TOOTH EXTRACTION     Social History   Tobacco Use  . Smoking status: Former Smoker    Packs/day: 1.00    Years: 5.00    Pack years: 5.00    Types: Cigarettes    Quit date: 01/24/1968    Years since quitting: 51.7  . Smokeless tobacco: Never Used  Vaping Use  . Vaping Use: Never used  Substance Use Topics  . Alcohol use: Yes    Alcohol/week: 14.0 - 21.0 standard drinks    Types: 14 - 21 Glasses of wine per week    Comment: 2-3 glasses wine/day  . Drug use: No   Family History  Problem Relation Age of Onset  . Heart attack Father   . Breast cancer Mother   . Thyroid cancer Maternal Grandmother   . Colon cancer Neg Hx   . Esophageal cancer Neg Hx   . Rectal cancer Neg Hx   . Stomach cancer Neg Hx    Allergies  Allergen Reactions  . Hydrocodone Nausea Only    Hydrocodone caused terrible nausea. Hydrocodone caused terrible nausea.   . Penicillin G Other (See Comments)    Reacted as a child. Unsure what happened.   Marland Kitchen Penicillins     REACTION: allergy as a child   Current Outpatient Medications on File Prior to Visit  Medication Sig Dispense Refill  . ALPRAZolam (XANAX) 0.5 MG tablet Take 1 by mouth up to twice daily for airplane flight 15 tablet 0  . bismuth subsalicylate (PEPTO BISMOL) 262 MG/15ML suspension Take 30 mLs by mouth as needed.    . calcium carbonate (TUMS EX) 750 MG chewable tablet Chew 2-3 tablets by mouth at bedtime as needed for heartburn.    . famotidine (PEPCID) 20 MG tablet Take 40 mg by mouth at bedtime.     . Methylcellulose, Laxative, (CITRUCEL PO) Take 2 tablets by mouth daily.    Marland Kitchen MYRBETRIQ 50 MG TB24 tablet Take 50 mg by mouth daily.  0  . omeprazole (PRILOSEC OTC) 20 MG tablet Take 20 mg by mouth every morning.     . tadalafil  (CIALIS) 5 MG tablet Take 5 mg by mouth daily as needed for erectile dysfunction.     No current facility-administered medications on file prior to visit.    Review of Systems  Constitutional: Negative for activity change, appetite change, fatigue, fever and unexpected weight change.  HENT: Negative for congestion, rhinorrhea, sore throat and trouble swallowing.   Eyes: Negative for pain, redness, itching and visual disturbance.  Respiratory: Negative for cough, chest tightness, shortness of breath and wheezing.   Cardiovascular: Negative for chest pain and palpitations.  Gastrointestinal: Negative for abdominal pain, blood in stool, constipation, diarrhea and nausea.  Endocrine: Negative for cold intolerance, heat intolerance, polydipsia and polyuria.  Genitourinary: Negative for difficulty urinating, dysuria, frequency and urgency.  Musculoskeletal: Positive for arthralgias. Negative for joint swelling and myalgias.  Skin: Negative for pallor and rash.  Neurological: Negative for dizziness, tremors, weakness, numbness and headaches.  Hematological: Negative for adenopathy. Does not bruise/bleed easily.  Psychiatric/Behavioral: Negative for decreased concentration and dysphoric mood. The patient is not nervous/anxious.        Objective:   Physical Exam Constitutional:      General: He is not in acute distress.    Appearance: Normal appearance. He is well-developed and normal weight. He is not ill-appearing or diaphoretic.  HENT:     Head: Normocephalic and atraumatic.     Right Ear: Tympanic membrane, ear canal and external ear normal.     Left Ear: Tympanic membrane, ear canal and external ear normal.     Nose: Nose normal. No congestion.     Mouth/Throat:     Mouth: Mucous membranes are moist.     Pharynx: Oropharynx is clear. No posterior oropharyngeal erythema.  Eyes:     General: No scleral icterus.       Right eye: No discharge.        Left eye: No discharge.      Conjunctiva/sclera: Conjunctivae normal.     Pupils: Pupils are equal, round, and reactive to light.  Neck:     Thyroid: No thyromegaly.     Vascular: No carotid bruit or JVD.  Cardiovascular:     Rate and Rhythm: Normal rate and regular rhythm.     Pulses: Normal pulses.     Heart sounds: Normal heart sounds. No gallop.   Pulmonary:     Effort: Pulmonary effort is normal. No respiratory distress.     Breath sounds: Normal breath sounds. No wheezing or rales.     Comments: Good air exch Chest:     Chest wall: No tenderness.  Abdominal:     General: Bowel sounds are normal. There is no distension or abdominal bruit.     Palpations: Abdomen is soft. There is no mass.     Tenderness: There is no abdominal tenderness.     Hernia: No hernia is present.  Musculoskeletal:        General: No tenderness.     Cervical back: Normal range of motion and neck supple. No rigidity. No muscular tenderness.     Right lower leg: No edema.     Left lower leg: No edema.     Comments: No kyphosis    Lymphadenopathy:     Cervical: No cervical adenopathy.  Skin:    General: Skin is warm and dry.     Coloration: Skin is not pale.     Findings: No erythema or rash.     Comments: Fair complexion  occ lentigo and angioma  Neurological:     Mental Status: He is alert.     Cranial Nerves: No cranial nerve deficit.     Motor: No abnormal muscle tone.     Coordination: Coordination normal.  Gait: Gait normal.     Deep Tendon Reflexes: Reflexes are normal and symmetric. Reflexes normal.  Psychiatric:        Mood and Affect: Mood normal.        Cognition and Memory: Cognition and memory normal.     Comments: Pleasant            Assessment & Plan:   Problem List Items Addressed This Visit      Digestive   GERD    Continues omeprazole and pepcid  No particular dietary trigger  Sees GI        Genitourinary   BPH (benign prostatic hyperplasia)    Lab Results  Component Value Date    PSA 0.52 10/10/2019   PSA 0.57 10/07/2018   PSA 0.59 10/05/2017    Continues urology f/u as well as mybetriq        Other   Hyperlipidemia    Disc goals for lipids and reasons to control them Rev last labs with pt Rev low sat fat diet in detail LDL down slt  Good HDL  Fairly stable        Routine general medical examination at a health care facility - Primary    Reviewed health habits including diet and exercise and skin cancer prevention amw reviewed  Reviewed appropriate screening tests for age  Also reviewed health mt list, fam hx and immunization status , as well as social and family history   See HPI Labs reviewed  Flu shot given  covid immunized  No prostate clinical changes       Relevant Orders   Flu Vaccine QUAD High Dose(Fluad) (Completed)   Elevated glucose level    Lab Results  Component Value Date   HGBA1C 5.5 10/10/2019   disc imp of low glycemic diet and wt loss to prevent DM2        Other Visit Diagnoses    Need for influenza vaccination       Relevant Orders   Flu Vaccine QUAD High Dose(Fluad) (Completed)

## 2019-10-14 NOTE — Assessment & Plan Note (Signed)
Lab Results  Component Value Date   PSA 0.52 10/10/2019   PSA 0.57 10/07/2018   PSA 0.59 10/05/2017    Continues urology f/u as well as mybetriq

## 2019-10-14 NOTE — Assessment & Plan Note (Signed)
Continues omeprazole and pepcid  No particular dietary trigger  Sees GI

## 2019-10-14 NOTE — Assessment & Plan Note (Addendum)
Reviewed health habits including diet and exercise and skin cancer prevention amw reviewed  Reviewed appropriate screening tests for age  Also reviewed health mt list, fam hx and immunization status , as well as social and family history   See HPI Labs reviewed  Flu shot given  covid immunized  No prostate clinical changes

## 2019-10-14 NOTE — Patient Instructions (Addendum)
Take care of yourself  Keep exercising  Keep brain active as well   Labs are stable   Flu shot today

## 2019-11-06 DIAGNOSIS — M9903 Segmental and somatic dysfunction of lumbar region: Secondary | ICD-10-CM | POA: Diagnosis not present

## 2019-11-06 DIAGNOSIS — M5137 Other intervertebral disc degeneration, lumbosacral region: Secondary | ICD-10-CM | POA: Diagnosis not present

## 2019-11-06 DIAGNOSIS — M4607 Spinal enthesopathy, lumbosacral region: Secondary | ICD-10-CM | POA: Diagnosis not present

## 2019-11-06 DIAGNOSIS — M9904 Segmental and somatic dysfunction of sacral region: Secondary | ICD-10-CM | POA: Diagnosis not present

## 2019-11-10 DIAGNOSIS — M4607 Spinal enthesopathy, lumbosacral region: Secondary | ICD-10-CM | POA: Diagnosis not present

## 2019-11-10 DIAGNOSIS — M9903 Segmental and somatic dysfunction of lumbar region: Secondary | ICD-10-CM | POA: Diagnosis not present

## 2019-11-10 DIAGNOSIS — M9904 Segmental and somatic dysfunction of sacral region: Secondary | ICD-10-CM | POA: Diagnosis not present

## 2019-11-10 DIAGNOSIS — M5137 Other intervertebral disc degeneration, lumbosacral region: Secondary | ICD-10-CM | POA: Diagnosis not present

## 2019-11-12 DIAGNOSIS — M9903 Segmental and somatic dysfunction of lumbar region: Secondary | ICD-10-CM | POA: Diagnosis not present

## 2019-11-12 DIAGNOSIS — M5137 Other intervertebral disc degeneration, lumbosacral region: Secondary | ICD-10-CM | POA: Diagnosis not present

## 2019-11-12 DIAGNOSIS — M9904 Segmental and somatic dysfunction of sacral region: Secondary | ICD-10-CM | POA: Diagnosis not present

## 2019-11-12 DIAGNOSIS — M4607 Spinal enthesopathy, lumbosacral region: Secondary | ICD-10-CM | POA: Diagnosis not present

## 2019-11-13 DIAGNOSIS — M9904 Segmental and somatic dysfunction of sacral region: Secondary | ICD-10-CM | POA: Diagnosis not present

## 2019-11-13 DIAGNOSIS — M9903 Segmental and somatic dysfunction of lumbar region: Secondary | ICD-10-CM | POA: Diagnosis not present

## 2019-11-13 DIAGNOSIS — M4607 Spinal enthesopathy, lumbosacral region: Secondary | ICD-10-CM | POA: Diagnosis not present

## 2019-11-13 DIAGNOSIS — M5137 Other intervertebral disc degeneration, lumbosacral region: Secondary | ICD-10-CM | POA: Diagnosis not present

## 2019-11-17 DIAGNOSIS — M9904 Segmental and somatic dysfunction of sacral region: Secondary | ICD-10-CM | POA: Diagnosis not present

## 2019-11-17 DIAGNOSIS — M5137 Other intervertebral disc degeneration, lumbosacral region: Secondary | ICD-10-CM | POA: Diagnosis not present

## 2019-11-17 DIAGNOSIS — M9903 Segmental and somatic dysfunction of lumbar region: Secondary | ICD-10-CM | POA: Diagnosis not present

## 2019-11-17 DIAGNOSIS — M4607 Spinal enthesopathy, lumbosacral region: Secondary | ICD-10-CM | POA: Diagnosis not present

## 2019-11-24 DIAGNOSIS — M5137 Other intervertebral disc degeneration, lumbosacral region: Secondary | ICD-10-CM | POA: Diagnosis not present

## 2019-11-24 DIAGNOSIS — M4607 Spinal enthesopathy, lumbosacral region: Secondary | ICD-10-CM | POA: Diagnosis not present

## 2019-11-24 DIAGNOSIS — M9903 Segmental and somatic dysfunction of lumbar region: Secondary | ICD-10-CM | POA: Diagnosis not present

## 2019-11-24 DIAGNOSIS — M9904 Segmental and somatic dysfunction of sacral region: Secondary | ICD-10-CM | POA: Diagnosis not present

## 2019-12-02 DIAGNOSIS — M9903 Segmental and somatic dysfunction of lumbar region: Secondary | ICD-10-CM | POA: Diagnosis not present

## 2019-12-02 DIAGNOSIS — M4607 Spinal enthesopathy, lumbosacral region: Secondary | ICD-10-CM | POA: Diagnosis not present

## 2019-12-02 DIAGNOSIS — M9904 Segmental and somatic dysfunction of sacral region: Secondary | ICD-10-CM | POA: Diagnosis not present

## 2019-12-02 DIAGNOSIS — M5137 Other intervertebral disc degeneration, lumbosacral region: Secondary | ICD-10-CM | POA: Diagnosis not present

## 2019-12-15 DIAGNOSIS — M9903 Segmental and somatic dysfunction of lumbar region: Secondary | ICD-10-CM | POA: Diagnosis not present

## 2019-12-15 DIAGNOSIS — M5137 Other intervertebral disc degeneration, lumbosacral region: Secondary | ICD-10-CM | POA: Diagnosis not present

## 2019-12-15 DIAGNOSIS — M9904 Segmental and somatic dysfunction of sacral region: Secondary | ICD-10-CM | POA: Diagnosis not present

## 2019-12-15 DIAGNOSIS — M4607 Spinal enthesopathy, lumbosacral region: Secondary | ICD-10-CM | POA: Diagnosis not present

## 2020-01-05 DIAGNOSIS — M9903 Segmental and somatic dysfunction of lumbar region: Secondary | ICD-10-CM | POA: Diagnosis not present

## 2020-01-05 DIAGNOSIS — M5137 Other intervertebral disc degeneration, lumbosacral region: Secondary | ICD-10-CM | POA: Diagnosis not present

## 2020-01-05 DIAGNOSIS — M9904 Segmental and somatic dysfunction of sacral region: Secondary | ICD-10-CM | POA: Diagnosis not present

## 2020-03-03 DIAGNOSIS — Z85828 Personal history of other malignant neoplasm of skin: Secondary | ICD-10-CM | POA: Diagnosis not present

## 2020-03-03 DIAGNOSIS — B079 Viral wart, unspecified: Secondary | ICD-10-CM | POA: Diagnosis not present

## 2020-03-03 DIAGNOSIS — B351 Tinea unguium: Secondary | ICD-10-CM | POA: Diagnosis not present

## 2020-04-28 DIAGNOSIS — N5201 Erectile dysfunction due to arterial insufficiency: Secondary | ICD-10-CM | POA: Diagnosis not present

## 2020-04-28 DIAGNOSIS — N401 Enlarged prostate with lower urinary tract symptoms: Secondary | ICD-10-CM | POA: Diagnosis not present

## 2020-04-28 DIAGNOSIS — R350 Frequency of micturition: Secondary | ICD-10-CM | POA: Diagnosis not present

## 2020-05-05 ENCOUNTER — Ambulatory Visit (INDEPENDENT_AMBULATORY_CARE_PROVIDER_SITE_OTHER)
Admission: RE | Admit: 2020-05-05 | Discharge: 2020-05-05 | Disposition: A | Discharge status: Home / Self Care | Payer: Medicare PPO | Source: Ambulatory Visit | Attending: Family Medicine | Admitting: Family Medicine

## 2020-05-05 ENCOUNTER — Ambulatory Visit: Payer: Medicare PPO | Admitting: Family Medicine

## 2020-05-05 ENCOUNTER — Encounter: Payer: Self-pay | Admitting: Family Medicine

## 2020-05-05 ENCOUNTER — Other Ambulatory Visit: Payer: Self-pay

## 2020-05-05 DIAGNOSIS — M25562 Pain in left knee: Secondary | ICD-10-CM | POA: Diagnosis not present

## 2020-05-05 NOTE — Patient Instructions (Addendum)
Get over the counter voltaren gel 1%   (generic is fine) Use a small amount up to four times daily   After activity -use ice for 10 minutes   Tylenol for pain is ok if needed   Let's check an xray today   A compression knee sleeve from drug or sporting goods store may help when being active

## 2020-05-05 NOTE — Assessment & Plan Note (Signed)
For about 10 days w/o trauma or new activity  Dull, medial and worse with walking (also stiff in the am) Mild swelling  Suspect possible OA or overuse injury  Xray today-pend rad review  Recommend voltaren gel otc instead of asa Ice prn  Compression knee sleeve prn  Further plan to follow

## 2020-05-05 NOTE — Progress Notes (Signed)
Subjective:    Patient ID: Darren [name], male    DOB: 1945-06-18, 75 y.o.   MRN: [id number]  This visit occurred during the SARS-CoV-2 public health emergency.  Safety protocols were in place, including screening questions prior to the visit, additional usage of staff PPE, and extensive cleaning of exam room while observing appropriate contact time as indicated for disinfecting solutions.    HPI  Pt presents with c/o knee pain  Wt Readings from Last 3 Encounters:  05/05/20 219 lb 6 oz (99.5 kg)  10/14/19 210 lb 1 oz (95.3 kg)  07/08/19 213 lb 1 oz (96.6 kg)   29.96 kg/m  About 10 d ago woke up and L knee hurt  No known injury or trauma or new activity  He took some coated aspirin for pain for a few days and it did not get any better   Continued regular activities Last week noticed the knee looked puffy /swollen   Pain is medial  No redness No warmth  Feels more pain when walking  Not keeping him up at night   Generally walks for exercise /uses fitbit to monitor steps  Put on about 10 lb during the pandemic as well   Patient Active Problem List   Diagnosis Date Noted  . Left knee pain 05/05/2020  . Exposure to communicable disease 08/20/2018  . Elevated glucose level 09/19/2016  . Nasal congestion 09/19/2016  . Encounter for Medicare annual wellness exam 08/18/2014  . Routine general medical examination at a health care facility 08/10/2014  . PERSONAL HISTORY OF COLONIC POLYPS 09/21/2009  . Hyperlipidemia 12/02/2008  . GERD 12/02/2008  . ANXIETY DISORDER, SITUATIONAL, MILD 04/03/2008  . BPH (benign prostatic hyperplasia) 10/22/2006   Past Medical History:  Diagnosis Date  . Basal cell carcinoma    face  . GERD (gastroesophageal reflux disease)   . History of colon polyps   . Hypertrophy of prostate without urinary obstruction and other lower urinary tract symptoms (LUTS)   . Leukocytopenia, unspecified   . Other and unspecified hyperlipidemia    no meds   . Overactive bladder    Past Surgical History:  Procedure Laterality Date  . COLONOSCOPY     multiple - polyps  . PROSTATE SURGERY  08/2012   [facility]URP  at [facility]  . ROTATOR CUFF REPAIR Right 2003  . UPPER GI ENDOSCOPY    . WISDOM TOOTH EXTRACTION     Social History   Tobacco Use  . Smoking status: Former Smoker    Packs/day: 1.00    Years: 5.00    Pack years: 5.00    Types: Cigarettes    Quit date: 01/24/1968    Years since quitting: 52.3  . Smokeless tobacco: Never Used  Vaping Use  . Vaping Use: Never used  Substance Use Topics  . Alcohol use: Yes    Alcohol/week: 14.0 - 21.0 standard drinks    Types: 14 - 21 Glasses of wine per week    Comment: 2-3 glasses wine/day  . Drug use: No   Family History  Problem Relation Age of Onset  . Heart attack Father   . Breast cancer Mother   . Thyroid cancer Maternal Grandmother   . Colon cancer Neg Hx   . Esophageal cancer Neg Hx   . Rectal cancer Neg Hx   . Stomach cancer Neg Hx    Allergies  Allergen Reactions  . Hydrocodone Nausea Only    Hydrocodone caused terrible nausea. Hydrocodone caused terrible nausea.   Marland Kitchen  Penicillin G Other (See Comments)    Reacted as a child. Unsure what happened.   Marland Kitchen Penicillins     REACTION: allergy as a child   Current Outpatient Medications on File Prior to Visit  Medication Sig Dispense Refill  . ALPRAZolam (XANAX) 0.5 MG tablet Take 1 by mouth up to twice daily for airplane flight 15 tablet 0  . bismuth subsalicylate (PEPTO BISMOL) 262 MG/15ML suspension Take 30 mLs by mouth as needed.    . calcium carbonate (TUMS EX) 750 MG chewable tablet Chew 2-3 tablets by mouth at bedtime as needed for heartburn.    . famotidine (PEPCID) 20 MG tablet Take 40 mg by mouth at bedtime.     . Methylcellulose, Laxative, (CITRUCEL PO) Take 2 tablets by mouth daily.    Marland Kitchen MYRBETRIQ 50 MG TB24 tablet Take 50 mg by mouth daily.  0  . omeprazole (PRILOSEC OTC) 20 MG tablet Take 20 mg by mouth every morning.      . tadalafil (CIALIS) 5 MG tablet Take 5 mg by mouth daily as needed for erectile dysfunction.     No current facility-administered medications on file prior to visit.    Review of Systems  Constitutional: Negative for activity change, appetite change, fatigue, fever and unexpected weight change.  HENT: Negative for congestion, rhinorrhea, sore throat and trouble swallowing.   Eyes: Negative for pain, redness, itching and visual disturbance.  Respiratory: Negative for cough, chest tightness, shortness of breath and wheezing.   Cardiovascular: Negative for chest pain and palpitations.  Gastrointestinal: Negative for abdominal pain, blood in stool, constipation, diarrhea and nausea.  Endocrine: Negative for cold intolerance, heat intolerance, polydipsia and polyuria.  Genitourinary: Negative for difficulty urinating, dysuria, frequency and urgency.  Musculoskeletal: Negative for arthralgias, joint swelling and myalgias.       Left knee pain   Skin: Negative for pallor and rash.  Neurological: Negative for dizziness, tremors, weakness, numbness and headaches.  Hematological: Negative for adenopathy. Does not bruise/bleed easily.  Psychiatric/Behavioral: Negative for decreased concentration and dysphoric mood. The patient is not nervous/anxious.        Objective:   Physical Exam Constitutional:      General: He is not in acute distress.    Appearance: Normal appearance. He is obese. He is not ill-appearing.  Eyes:     Conjunctiva/sclera: Conjunctivae normal.  Cardiovascular:     Rate and Rhythm: Normal rate and regular rhythm.  Pulmonary:     Effort: Pulmonary effort is normal. No respiratory distress.  Musculoskeletal:     Comments: Left knee:  Mild medial superficial swelling w/o obv effusion No warmth to the touch  No crepitus  ROM: Flex-normal/ some discomfort at end point  Ext -normal mcmurray-no discomfort Bounce test -no discomfort  Stability: Anterior  drawer-nl Lachman exam -nl  Tenderness -mild over patellar tendon and slightly at medial joint line  Gait -normal      Skin:    General: Skin is warm and dry.     Findings: No erythema or rash.  Neurological:     Mental Status: He is alert.     Motor: No weakness.     Coordination: Coordination normal.     Gait: Gait normal.  Psychiatric:        Mood and Affect: Mood normal.           Assessment & Plan:   Problem List Items Addressed This Visit      Other   Left knee pain  For about 10 days w/o trauma or new activity  Dull, medial and worse with walking (also stiff in the am) Mild swelling  Suspect possible OA or overuse injury  Xray today-pend rad review  Recommend voltaren gel otc instead of asa Ice prn  Compression knee sleeve prn  Further plan to follow      Relevant Orders   DG Knee 4 Views W/Patella Left

## 2020-05-10 ENCOUNTER — Telehealth: Payer: Self-pay | Admitting: *Deleted

## 2020-05-10 NOTE — Telephone Encounter (Signed)
Left a message on voicemail for patient to call the office back. Need to give x-ray results when patient calls the office back. See result note

## 2020-05-10 NOTE — Telephone Encounter (Signed)
Patient returned your call. EM

## 2020-05-10 NOTE — Telephone Encounter (Signed)
Patient notified of test results and verbalized understanding.   

## 2020-10-04 DIAGNOSIS — Z961 Presence of intraocular lens: Secondary | ICD-10-CM | POA: Diagnosis not present

## 2020-10-17 ENCOUNTER — Telehealth: Payer: Self-pay | Admitting: Family Medicine

## 2020-10-17 DIAGNOSIS — E7800 Pure hypercholesterolemia, unspecified: Secondary | ICD-10-CM

## 2020-10-17 DIAGNOSIS — R7309 Other abnormal glucose: Secondary | ICD-10-CM

## 2020-10-17 DIAGNOSIS — Z0000 Encounter for general adult medical examination without abnormal findings: Secondary | ICD-10-CM

## 2020-10-17 DIAGNOSIS — N400 Enlarged prostate without lower urinary tract symptoms: Secondary | ICD-10-CM

## 2020-10-17 NOTE — Telephone Encounter (Signed)
-----   Message from Ellamae Sia sent at 10/06/2020  9:18 AM EDT ----- Regarding: Lab orders for Monday, 9.26.22  AWV lab orders, please.

## 2020-10-18 ENCOUNTER — Other Ambulatory Visit: Payer: Medicare PPO

## 2020-10-28 ENCOUNTER — Encounter: Payer: Self-pay | Admitting: Family Medicine

## 2020-10-28 ENCOUNTER — Other Ambulatory Visit: Payer: Self-pay

## 2020-10-28 ENCOUNTER — Ambulatory Visit (INDEPENDENT_AMBULATORY_CARE_PROVIDER_SITE_OTHER): Payer: Medicare PPO | Admitting: Family Medicine

## 2020-10-28 VITALS — BP 128/82 | HR 58 | Temp 97.5°F | Ht 71.75 in | Wt 208.0 lb

## 2020-10-28 DIAGNOSIS — N400 Enlarged prostate without lower urinary tract symptoms: Secondary | ICD-10-CM | POA: Diagnosis not present

## 2020-10-28 DIAGNOSIS — R7309 Other abnormal glucose: Secondary | ICD-10-CM

## 2020-10-28 DIAGNOSIS — Z0000 Encounter for general adult medical examination without abnormal findings: Secondary | ICD-10-CM

## 2020-10-28 DIAGNOSIS — K219 Gastro-esophageal reflux disease without esophagitis: Secondary | ICD-10-CM | POA: Diagnosis not present

## 2020-10-28 DIAGNOSIS — E7800 Pure hypercholesterolemia, unspecified: Secondary | ICD-10-CM

## 2020-10-28 DIAGNOSIS — Z79899 Other long term (current) drug therapy: Secondary | ICD-10-CM | POA: Diagnosis not present

## 2020-10-28 LAB — CBC WITH DIFFERENTIAL/PLATELET
Basophils Absolute: 0 10*3/uL (ref 0.0–0.1)
Basophils Relative: 1 % (ref 0.0–3.0)
Eosinophils Absolute: 0.1 10*3/uL (ref 0.0–0.7)
Eosinophils Relative: 1.9 % (ref 0.0–5.0)
HCT: 41.6 % (ref 39.0–52.0)
Hemoglobin: 14 g/dL (ref 13.0–17.0)
Lymphocytes Relative: 31.1 % (ref 12.0–46.0)
Lymphs Abs: 1.1 10*3/uL (ref 0.7–4.0)
MCHC: 33.8 g/dL (ref 30.0–36.0)
MCV: 99.2 fl (ref 78.0–100.0)
Monocytes Absolute: 0.4 10*3/uL (ref 0.1–1.0)
Monocytes Relative: 12.2 % — ABNORMAL HIGH (ref 3.0–12.0)
Neutro Abs: 1.9 10*3/uL (ref 1.4–7.7)
Neutrophils Relative %: 53.8 % (ref 43.0–77.0)
Platelets: 142 10*3/uL — ABNORMAL LOW (ref 150.0–400.0)
RBC: 4.19 Mil/uL — ABNORMAL LOW (ref 4.22–5.81)
RDW: 12.3 % (ref 11.5–15.5)
WBC: 3.5 10*3/uL — ABNORMAL LOW (ref 4.0–10.5)

## 2020-10-28 LAB — COMPREHENSIVE METABOLIC PANEL
ALT: 13 U/L (ref 0–53)
AST: 15 U/L (ref 0–37)
Albumin: 4.2 g/dL (ref 3.5–5.2)
Alkaline Phosphatase: 38 U/L — ABNORMAL LOW (ref 39–117)
BUN: 13 mg/dL (ref 6–23)
CO2: 31 mEq/L (ref 19–32)
Calcium: 9.5 mg/dL (ref 8.4–10.5)
Chloride: 104 mEq/L (ref 96–112)
Creatinine, Ser: 0.92 mg/dL (ref 0.40–1.50)
GFR: 81.26 mL/min (ref >60.00)
Glucose, Bld: 96 mg/dL (ref 70–99)
Potassium: 4.2 mEq/L (ref 3.5–5.1)
Sodium: 139 mEq/L (ref 135–145)
Total Bilirubin: 1 mg/dL (ref 0.2–1.2)
Total Protein: 6.3 g/dL (ref 6.0–8.3)

## 2020-10-28 LAB — VITAMIN B12: Vitamin B-12: 226 pg/mL (ref 211–911)

## 2020-10-28 LAB — LIPID PANEL
Cholesterol: 194 mg/dL (ref 0–200)
HDL: 66.4 mg/dL (ref >39.00)
LDL Cholesterol: 109 mg/dL — ABNORMAL HIGH (ref 0–99)
NonHDL: 127.16
Total CHOL/HDL Ratio: 3
Triglycerides: 89 mg/dL (ref 0.0–149.0)
VLDL: 17.8 mg/dL (ref 0.0–40.0)

## 2020-10-28 LAB — HEMOGLOBIN A1C: Hgb A1c MFr Bld: 5.3 % (ref 4.6–6.5)

## 2020-10-28 LAB — TSH: TSH: 2 u[IU]/mL (ref 0.35–5.50)

## 2020-10-28 LAB — PSA, MEDICARE: PSA: 0.52 ng/ml (ref 0.10–4.00)

## 2020-10-28 NOTE — Assessment & Plan Note (Signed)
Lipid panel today  Disc goals for lipids and reasons to control them Rev last labs with pt Rev low sat fat diet in detail Diet has improved

## 2020-10-28 NOTE — Assessment & Plan Note (Signed)
Reviewed health habits including diet and exercise and skin cancer prevention Reviewed appropriate screening tests for age  Also reviewed health mt list, fam hx and immunization status , as well as social and family history   See HPI Labs ordered No falls or fractures and regular exercise  psa done, continues urology care Adv vit D 1000 iu daily for bone health  Adv directive is up to date No cognitive concerns, very sharp /enc reading and socialization  Hearing screen rev-does not desire audiology ref at this time  Eye exam planned phq score 0 , mood is good  No problems with ADLs  Sees derm yearly

## 2020-10-28 NOTE — Progress Notes (Signed)
Subjective:    Patient ID: Darren [name], male    DOB: 27-Aug-1945, 75 y.o.   MRN: [id number]  This visit occurred during the SARS-CoV-2 public health emergency.  Safety protocols were in place, including screening questions prior to the visit, additional usage of staff PPE, and extensive cleaning of exam room while observing appropriate contact time as indicated for disinfecting solutions.   HPI Pt presents for amw and health mt visit   I have personally reviewed the Medicare Annual Wellness questionnaire and have noted 1. The patient's medical and social history 2. Their use of alcohol, tobacco or illicit drugs 3. Their current medications and supplements 4. The patient's functional ability including ADL's, fall risks, home safety risks and hearing or visual             impairment. 5. Diet and physical activities 6. Evidence for depression or mood disorders  The patients weight, height, BMI have been recorded in the chart and visual acuity is per eye clinic.  I have made referrals, counseling and provided education to the patient based review of the above and I have provided the pt with a written personalized care plan for preventive services. Reviewed and updated provider list, see scanned forms.  See scanned forms.  Routine anticipatory guidance given to patient.  See health maintenance. Colon cancer screening  colonoscopy 2/17   Flu vaccine up to date Covid vaccinated-all 5 shots  Tetanus vaccine postpones for ins Pneumovax up to date Zoster vaccine had shingrix  Falls- none Fractures-none  Supplements-none  Exercise -regular   Prostate cancer screening H/o BPH Sees urology  Lab Results  Component Value Date   PSA 0.52 10/10/2019   PSA 0.57 10/07/2018   PSA 0.59 10/05/2017  Had prostate surgery years ago  Now getting up in the night more  Planning a urology visit in the spring  Does need psa    Advance directive-up to date  Cognitive function addressed-  see scanned forms- and if abnormal then additional documentation follows.   Pretty good overall  Some issues with name retrieval  No confusion , does not get lost  Reads and socializes    PMH and SH reviewed  Meds, vitals, and allergies reviewed.   ROS: See HPI.  Otherwise negative.    Weight : Wt Readings from Last 3 Encounters:  10/28/20 208 lb (94.3 kg)  05/05/20 219 lb 6 oz (99.5 kg)  10/14/19 210 lb 1 oz (95.3 kg)   28.41 kg/m  Exercises and lost his pandemic weight  Eating mindfully    Hearing/vision: Hearing Screening   500Hz  1000Hz  2000Hz  4000Hz   Right ear 0 40 25 25  Left ear 0 40 20 25  Vision Screening - Comments:: Patient had just recently changed his prescription for his glasses two weeks ago.   Occ has to have someone repeat themselves  Not bothersome enough for hearing aide   PHQ: Depression screen [facility] 2/9 10/28/2020 10/10/2019 10/08/2018 10/05/2017 09/19/2016  Decreased Interest 0 0 0 0 0  Down, Depressed, Hopeless 0 0 0 0 0  PHQ - 2 Score 0 0 0 0 0  Altered sleeping - 0 0 0 -  Tired, decreased energy - 0 0 0 -  Change in appetite - 0 0 0 -  Feeling bad or failure about yourself  - 0 0 0 -  Trouble concentrating - 0 0 0 -  Moving slowly or fidgety/restless - 0 0 0 -  Suicidal thoughts - 0 0  0 -  PHQ-9 Score - 0 0 0 -  Difficult doing work/chores - Not difficult at all Not difficult at all Not difficult at all -   H/o anxiety  ADLs: no problems at all  Functionality: excellent   Doing well overall  Staying busy  Fairly good self care   Derm care  Sees derm yearly Dr Darrol Jump [facility]Ill  Care team : Chrisangel Eskenazi- pcp [facility] -dermatology   BP Readings from Last 3 Encounters:  10/28/20 128/82  05/05/20 136/78  10/14/19 130/80   Pulse Readings from Last 3 Encounters:  10/28/20 (!) 58  05/05/20 83  10/14/19 69    Due for labs: Hyperlipidemia Lab Results  Component Value Date   CHOL 183 10/10/2019   HDL 58.20 10/10/2019    LDLCALC 110 (H) 10/10/2019   LDLDIRECT 127.7 02/15/2009   TRIG 78.0 10/10/2019   CHOLHDL 3 10/10/2019   Glucose  Lab Results  Component Value Date   HGBA1C 5.5 10/10/2019   Takes ppi Lab Results  Component Value Date   VITAMINB12 415 02/15/2009    Patient Active Problem List   Diagnosis Date Noted   Current use of proton pump inhibitor 10/28/2020   Left knee pain 05/05/2020   Exposure to communicable disease 08/20/2018   Elevated glucose level 09/19/2016   Nasal congestion 09/19/2016   Encounter for Medicare annual wellness exam 08/18/2014   Routine general medical examination at a health care facility 08/10/2014   PERSONAL HISTORY OF COLONIC POLYPS 09/21/2009   Hyperlipidemia 12/02/2008   GERD 12/02/2008   ANXIETY DISORDER, SITUATIONAL, MILD 04/03/2008   BPH (benign prostatic hyperplasia) 10/22/2006   Past Medical History:  Diagnosis Date   Basal cell carcinoma    face   GERD (gastroesophageal reflux disease)    History of colon polyps    Hypertrophy of prostate without urinary obstruction and other lower urinary tract symptoms (LUTS)    Leukocytopenia, unspecified    Other and unspecified hyperlipidemia    no meds   Overactive bladder    Past Surgical History:  Procedure Laterality Date   COLONOSCOPY     multiple - polyps   PROSTATE SURGERY  08/2012   TURP  at [facility] Right 2003   UPPER GI ENDOSCOPY     WISDOM TOOTH EXTRACTION     Social History   Tobacco Use   Smoking status: Former    Packs/day: 1.00    Years: 5.00    Pack years: 5.00    Types: Cigarettes    Quit date: 01/24/1968    Years since quitting: 52.7   Smokeless tobacco: Never  Vaping Use   Vaping Use: Never used  Substance Use Topics   Alcohol use: Yes    Alcohol/week: 14.0 - 21.0 standard drinks    Types: 14 - 21 Glasses of wine per week    Comment: 2-3 glasses wine/day   Drug use: No   Family History  Problem Relation Age of Onset   Heart attack Father     Breast cancer Mother    Thyroid cancer Maternal Grandmother    Colon cancer Neg Hx    Esophageal cancer Neg Hx    Rectal cancer Neg Hx    Stomach cancer Neg Hx    Allergies  Allergen Reactions   Hydrocodone Nausea Only    Hydrocodone caused terrible nausea.   Penicillin G Other (See Comments)    Reacted as a child. Unsure what happened.  Penicillins     REACTION: allergy as a child   Current Outpatient Medications on File Prior to Visit  Medication Sig Dispense Refill   ALPRAZolam (XANAX) 0.5 MG tablet Take 1 by mouth up to twice daily for airplane flight 15 tablet 0   bismuth subsalicylate (PEPTO BISMOL) 262 MG/15ML suspension Take 30 mLs by mouth as needed.     calcium carbonate (TUMS EX) 750 MG chewable tablet Chew 2-3 tablets by mouth at bedtime as needed for heartburn.     famotidine (PEPCID) 20 MG tablet Take 40 mg by mouth at bedtime.      Methylcellulose, Laxative, (CITRUCEL PO) Take 2 tablets by mouth daily.     MYRBETRIQ 50 MG TB24 tablet Take 50 mg by mouth daily.  0   omeprazole (PRILOSEC OTC) 20 MG tablet Take 20 mg by mouth every morning.      tadalafil (CIALIS) 5 MG tablet Take 5 mg by mouth daily as needed for erectile dysfunction.     No current facility-administered medications on file prior to visit.     Review of Systems  Constitutional:  Negative for activity change, appetite change, fatigue, fever and unexpected weight change.  HENT:  Negative for congestion, rhinorrhea, sore throat and trouble swallowing.   Eyes:  Negative for pain, redness, itching and visual disturbance.  Respiratory:  Negative for cough, chest tightness, shortness of breath and wheezing.   Cardiovascular:  Negative for chest pain and palpitations.  Gastrointestinal:  Negative for abdominal pain, blood in stool, constipation, diarrhea and nausea.  Endocrine: Negative for cold intolerance, heat intolerance, polydipsia and polyuria.  Genitourinary:  Negative for difficulty urinating,  dysuria, frequency and urgency.  Musculoskeletal:  Negative for arthralgias, joint swelling and myalgias.  Skin:  Negative for pallor and rash.  Neurological:  Negative for dizziness, tremors, weakness, numbness and headaches.  Hematological:  Negative for adenopathy. Does not bruise/bleed easily.  Psychiatric/Behavioral:  Negative for decreased concentration and dysphoric mood. The patient is not nervous/anxious.       Objective:   Physical Exam Constitutional:      General: He is not in acute distress.    Appearance: Normal appearance. He is well-developed and normal weight. He is not ill-appearing or diaphoretic.  HENT:     Head: Normocephalic and atraumatic.     Right Ear: Tympanic membrane, ear canal and external ear normal.     Left Ear: Tympanic membrane, ear canal and external ear normal.     Nose: Nose normal. No congestion.     Mouth/Throat:     Mouth: Mucous membranes are moist.     Pharynx: Oropharynx is clear. No posterior oropharyngeal erythema.  Eyes:     General: No scleral icterus.       Right eye: No discharge.        Left eye: No discharge.     Conjunctiva/sclera: Conjunctivae normal.     Pupils: Pupils are equal, round, and reactive to light.  Neck:     Thyroid: No thyromegaly.     Vascular: No carotid bruit or JVD.  Cardiovascular:     Rate and Rhythm: Normal rate and regular rhythm.     Pulses: Normal pulses.     Heart sounds: Normal heart sounds.    No gallop.  Pulmonary:     Effort: Pulmonary effort is normal. No respiratory distress.     Breath sounds: Normal breath sounds. No wheezing or rales.     Comments: Good air exch Chest:  Chest wall: No tenderness.  Abdominal:     General: Bowel sounds are normal. There is no distension or abdominal bruit.     Palpations: Abdomen is soft. There is no mass.     Tenderness: There is no abdominal tenderness.     Hernia: No hernia is present.  Musculoskeletal:        General: No tenderness.     Cervical  back: Normal range of motion and neck supple. No rigidity. No muscular tenderness.     Right lower leg: No edema.     Left lower leg: No edema.     Comments: No kyphosis   Lymphadenopathy:     Cervical: No cervical adenopathy.  Skin:    General: Skin is warm and dry.     Coloration: Skin is not pale.     Findings: No erythema or rash.     Comments: Fair complexion Some lentigines   Neurological:     Mental Status: He is alert.     Cranial Nerves: No cranial nerve deficit.     Motor: No abnormal muscle tone.     Coordination: Coordination normal.     Gait: Gait normal.     Deep Tendon Reflexes: Reflexes are normal and symmetric. Reflexes normal.  Psychiatric:        Mood and Affect: Mood normal.        Cognition and Memory: Cognition and memory normal.          Assessment & Plan:   Problem List Items Addressed This Visit       Digestive   GERD    Takes omeprazole 20 mg dailyu         Genitourinary   BPH (benign prostatic hyperplasia)    More bothersome lately  Planning fu with urology psa done today Taking mybetriq          Other   Hyperlipidemia    Lipid panel today  Disc goals for lipids and reasons to control them Rev last labs with pt Rev low sat fat diet in detail Diet has improved       Routine general medical examination at a health care facility    Reviewed health habits including diet and exercise and skin cancer prevention Reviewed appropriate screening tests for age  Also reviewed health mt list, fam hx and immunization status , as well as social and family history   See HPI Labs ordered No falls or fractures and regular exercise  psa done, continues urology care Adv vit D 1000 iu daily for bone health  Adv directive is up to date No cognitive concerns, very sharp /enc reading and socialization  Hearing screen rev-does not desire audiology ref at this time  Eye exam planned phq score 0 , mood is good  No problems with ADLs  Sees derm  yearly      Encounter for Medicare annual wellness exam - Primary    Reviewed health habits including diet and exercise and skin cancer prevention Reviewed appropriate screening tests for age  Also reviewed health mt list, fam hx and immunization status , as well as social and family history   See HPI Labs ordered No falls or fractures and regular exercise  psa done, continues urology care Adv vit D 1000 iu daily for bone health  Adv directive is up to date No cognitive concerns, very sharp /enc reading and socialization  Hearing screen rev-does not desire audiology ref at this time  Eye exam planned phq  score 0 , mood is good  No problems with ADLs  Sees derm yearly      Elevated glucose level    A1c ordered Working on wt loss disc imp of low glycemic diet and wt loss to prevent DM2        Current use of proton pump inhibitor    Vit B12 level added to labs      Relevant Orders   Vitamin B12 (Completed)

## 2020-10-28 NOTE — Assessment & Plan Note (Signed)
A1c ordered Working on wt loss disc imp of low glycemic diet and wt loss to prevent DM2

## 2020-10-28 NOTE — Assessment & Plan Note (Signed)
Takes omeprazole 20 mg dailyu

## 2020-10-28 NOTE — Patient Instructions (Addendum)
I recommend vitamin D 1000 iu daily   Labs today   Take care of yourself  Stay active mind and body!  Continue urology and dermatology follow up

## 2020-10-28 NOTE — Assessment & Plan Note (Signed)
More bothersome lately  Planning fu with urology psa done today Taking mybetriq

## 2020-10-28 NOTE — Assessment & Plan Note (Signed)
Vit B12 level added to labs  

## 2020-12-31 DIAGNOSIS — L820 Inflamed seborrheic keratosis: Secondary | ICD-10-CM | POA: Diagnosis not present

## 2020-12-31 DIAGNOSIS — D485 Neoplasm of uncertain behavior of skin: Secondary | ICD-10-CM | POA: Diagnosis not present

## 2021-01-23 DIAGNOSIS — K635 Polyp of colon: Secondary | ICD-10-CM

## 2021-01-23 HISTORY — DX: Polyp of colon: K63.5

## 2021-03-01 DIAGNOSIS — D1801 Hemangioma of skin and subcutaneous tissue: Secondary | ICD-10-CM | POA: Diagnosis not present

## 2021-03-01 DIAGNOSIS — Z85828 Personal history of other malignant neoplasm of skin: Secondary | ICD-10-CM | POA: Diagnosis not present

## 2021-03-01 DIAGNOSIS — L821 Other seborrheic keratosis: Secondary | ICD-10-CM | POA: Diagnosis not present

## 2021-03-09 ENCOUNTER — Telehealth: Payer: Medicare PPO | Admitting: Physician Assistant

## 2021-03-09 DIAGNOSIS — U071 COVID-19: Secondary | ICD-10-CM

## 2021-03-09 MED ORDER — NIRMATRELVIR/RITONAVIR (PAXLOVID)TABLET: 3.0000 | ORAL_TABLET | Freq: Two times a day (BID) | ORAL | 0 refills | Status: AC

## 2021-03-09 MED ORDER — BENZONATATE 100 MG PO CAPS: 100.0000 mg | ORAL_CAPSULE | Freq: Three times a day (TID) | ORAL | 0 refills | Status: DC | PRN

## 2021-03-09 NOTE — Patient Instructions (Signed)
Darren [name], thank you for joining Leeanne Rio, PA-C for today's virtual visit.  While this provider is not your primary care provider (PCP), if your PCP is located in our provider database this encounter information will be shared with them immediately following your visit.  Consent: (Patient) Darren [name] provided verbal consent for this virtual visit at the beginning of the encounter.  Current Medications:  Current Outpatient Medications:    ALPRAZolam (XANAX) 0.5 MG tablet, Take 1 by mouth up to twice daily for airplane flight, Disp: 15 tablet, Rfl: 0   famotidine (PEPCID) 20 MG tablet, Take 40 mg by mouth at bedtime. , Disp: , Rfl:    Methylcellulose, Laxative, (CITRUCEL PO), Take 2 tablets by mouth daily., Disp: , Rfl:    MYRBETRIQ 50 MG TB24 tablet, Take 50 mg by mouth daily., Disp: , Rfl: 0   omeprazole (PRILOSEC OTC) 20 MG tablet, Take 20 mg by mouth every morning. , Disp: , Rfl:    tadalafil (CIALIS) 5 MG tablet, Take 5 mg by mouth daily as needed for erectile dysfunction., Disp: , Rfl:    Medications ordered in this encounter:  No orders of the defined types were placed in this encounter.    *If you need refills on other medications prior to your next appointment, please contact your pharmacy*  Follow-Up: Call back or seek an in-person evaluation if the symptoms worsen or if the condition fails to improve as anticipated.  Other Instructions Please keep well-hydrated and get plenty of rest. Start a saline nasal rinse to flush out your nasal passages. You can use plain Mucinex to help thin congestion. If you have a humidifier, running in the bedroom at night. I want you to start OTC vitamin D3 1000 units daily, vitamin C 1000 mg daily, and a zinc supplement. Please take prescribed medications as directed.  You have been enrolled in a MyChart symptom monitoring program. Please answer these questions daily so we can keep track of how you are  doing.  You were to quarantine for 5 days from onset of your symptoms.  After day 5, if you have had no fever and you are feeling better, you can end quarantine but need to mask for an additional 5 days. After day 5 if you have a fever or are having significant symptoms, please quarantine for full 10 days.  If you note any worsening of symptoms, any significant shortness of breath or any chest pain, please seek ER evaluation ASAP.  Please do not delay care!  COVID-19: What to Do if You Are Sick If you test positive and are an older adult or someone who is at high risk of getting very sick from COVID-19, treatment may be available. Contact a healthcare provider right away after a positive test to determine if you are eligible, even if your symptoms are mild right now. You can also visit a Test to Treat location and, if eligible, receive a prescription from a provider. Don't delay: Treatment must be started within the first few days to be effective. If you have a fever, cough, or other symptoms, you might have COVID-19. Most people have mild illness and are able to recover at home. If you are sick: Keep track of your symptoms. If you have an emergency warning sign (including trouble breathing), call 911. Steps to help prevent the spread of COVID-19 if you are sick If you are sick with COVID-19 or think you might have COVID-19, follow the steps below to  care for yourself and to help protect other people in your home and community. Stay home except to get medical care Stay home. Most people with COVID-19 have mild illness and can recover at home without medical care. Do not leave your home, except to get medical care. Do not visit public areas and do not go to places where you are unable to wear a mask. Take care of yourself. Get rest and stay hydrated. Take over-the-counter medicines, such as acetaminophen, to help you feel better. Stay in touch with your doctor. Call before you get medical care. Be  sure to get care if you have trouble breathing, or have any other emergency warning signs, or if you think it is an emergency. Avoid public transportation, ride-sharing, or taxis if possible. Get tested If you have symptoms of COVID-19, get tested. While waiting for test results, stay away from others, including staying apart from those living in your household. Get tested as soon as possible after your symptoms start. Treatments may be available for people with COVID-19 who are at risk for becoming very sick. Don't delay: Treatment must be started early to be effective--some treatments must begin within 5 days of your first symptoms. Contact your healthcare provider right away if your test result is positive to determine if you are eligible. Self-tests are one of several options for testing for the virus that causes COVID-19 and may be more convenient than laboratory-based tests and point-of-care tests. Ask your healthcare provider or your local health department if you need help interpreting your test results. You can visit your state, tribal, local, and territorial health department's website to look for the latest local information on testing sites. Separate yourself from other people As much as possible, stay in a specific room and away from other people and pets in your home. If possible, you should use a separate bathroom. If you need to be around other people or animals in or outside of the home, wear a well-fitting mask. Tell your close contacts that they may have been exposed to COVID-19. An infected person can spread COVID-19 starting 48 hours (or 2 days) before the person has any symptoms or tests positive. By letting your close contacts know they may have been exposed to COVID-19, you are helping to protect everyone. See COVID-19 and Animals if you have questions about pets. If you are diagnosed with COVID-19, someone from the health department may call you. Answer the call to slow the  spread. Monitor your symptoms Symptoms of COVID-19 include fever, cough, or other symptoms. Follow care instructions from your healthcare provider and local health department. Your local health authorities may give instructions on checking your symptoms and reporting information. When to seek emergency medical attention Look for emergency warning signs* for COVID-19. If someone is showing any of these signs, seek emergency medical care immediately: Trouble breathing Persistent pain or pressure in the chest New confusion Inability to wake or stay awake Pale, gray, or blue-colored skin, lips, or nail beds, depending on skin tone *This list is not all possible symptoms. Please call your medical provider for any other symptoms that are severe or concerning to you. Call 911 or call ahead to your local emergency facility: Notify the operator that you are seeking care for someone who has or may have COVID-19. Call ahead before visiting your doctor Call ahead. Many medical visits for routine care are being postponed or done by phone or telemedicine. If you have a medical appointment that cannot be postponed,  call your doctor's office, and tell them you have or may have COVID-19. This will help the office protect themselves and other patients. If you are sick, wear a well-fitting mask You should wear a mask if you must be around other people or animals, including pets (even at home). Wear a mask with the best fit, protection, and comfort for you. You don't need to wear the mask if you are alone. If you can't put on a mask (because of trouble breathing, for example), cover your coughs and sneezes in some other way. Try to stay at least 6 feet away from other people. This will help protect the people around you. Masks should not be placed on young children under age 7 years, anyone who has trouble breathing, or anyone who is not able to remove the mask without help. Cover your coughs and sneezes Cover  your mouth and nose with a tissue when you cough or sneeze. Throw away used tissues in a lined trash can. Immediately wash your hands with soap and water for at least 20 seconds. If soap and water are not available, clean your hands with an alcohol-based hand sanitizer that contains at least 60% alcohol. Clean your hands often Wash your hands often with soap and water for at least 20 seconds. This is especially important after blowing your nose, coughing, or sneezing; going to the bathroom; and before eating or preparing food. Use hand sanitizer if soap and water are not available. Use an alcohol-based hand sanitizer with at least 60% alcohol, covering all surfaces of your hands and rubbing them together until they feel dry. Soap and water are the best option, especially if hands are visibly dirty. Avoid touching your eyes, nose, and mouth with unwashed hands. Handwashing Tips Avoid sharing personal household items Do not share dishes, drinking glasses, cups, eating utensils, towels, or bedding with other people in your home. Wash these items thoroughly after using them with soap and water or put in the dishwasher. Clean surfaces in your home regularly Clean and disinfect high-touch surfaces (for example, doorknobs, tables, handles, light switches, and countertops) in your "sick room" and bathroom. In shared spaces, you should clean and disinfect surfaces and items after each use by the person who is ill. If you are sick and cannot clean, a caregiver or other person should only clean and disinfect the area around you (such as your bedroom and bathroom) on an as needed basis. Your caregiver/other person should wait as long as possible (at least several hours) and wear a mask before entering, cleaning, and disinfecting shared spaces that you use. Clean and disinfect areas that may have blood, stool, or body fluids on them. Use household cleaners and disinfectants. Clean visible dirty surfaces with  household cleaners containing soap or detergent. Then, use a household disinfectant. Use a product from [employer] List N: Disinfectants for Coronavirus (AGTXM-46). Be sure to follow the instructions on the label to ensure safe and effective use of the product. Many products recommend keeping the surface wet with a disinfectant for a certain period of time (look at "contact time" on the product label). You may also need to wear personal protective equipment, such as gloves, depending on the directions on the product label. Immediately after disinfecting, wash your hands with soap and water for 20 seconds. For completed guidance on cleaning and disinfecting your home, visit Complete Disinfection Guidance. Take steps to improve ventilation at home Improve ventilation (air flow) at home to help prevent from spreading COVID-19  to other people in your household. Clear out COVID-19 virus particles in the air by opening windows, using air filters, and turning on fans in your home. Use this interactive tool to learn how to improve air flow in your home. When you can be around others after being sick with COVID-19 Deciding when you can be around others is different for different situations. Find out when you can safely end home isolation. For any additional questions about your care, contact your healthcare provider or state or local health department. 04/13/2020 Content source: [facility] ([facility]), Division of Viral Diseases This information is not intended to replace advice given to you by your health care provider. Make sure you discuss any questions you have with your health care provider. Document Revised: 05/27/2020 Document Reviewed: 05/27/2020 Elsevier Patient Education  2022 [employer].   If you have been instructed to have an in-person evaluation today at a local Urgent Care facility, please use the link below. It will take you to a list of all of our  available [facility], including address, phone number and hours of operation. Please do not delay care.  [facility]  If you or a family member do not have a primary care provider, use the link below to schedule a visit and establish care. When you choose a [facility] primary care physician or advanced practice provider, you gain a long-term partner in health. Find a Primary Care Provider  Learn more about [facility] in-office and virtual care options: [facility]

## 2021-03-09 NOTE — Progress Notes (Signed)
Virtual Visit Consent   Darren [name], you are scheduled for a virtual visit with a [facility] provider today.     Just as with appointments in the office, your consent must be obtained to participate.  Your consent will be active for this visit and any virtual visit you may have with one of our providers in the next 365 days.     If you have a MyChart account, a copy of this consent can be sent to you electronically.  All virtual visits are billed to your insurance company just like a traditional visit in the office.    As this is a virtual visit, video technology does not allow for your provider to perform a traditional examination.  This may limit your provider's ability to fully assess your condition.  If your provider identifies any concerns that need to be evaluated in person or the need to arrange testing (such as labs, EKG, etc.), we will make arrangements to do so.     Although advances in technology are sophisticated, we cannot ensure that it will always work on either your end or our end.  If the connection with a video visit is poor, the visit may have to be switched to a telephone visit.  With either a video or telephone visit, we are not always able to ensure that we have a secure connection.     I need to obtain your verbal consent now.   Are you willing to proceed with your visit today?    Darren [name] has provided verbal consent on 03/09/2021 for a virtual visit (video or telephone).   Darren [name], [place]   Date: 03/09/2021 11:15 AM   Virtual Visit via Video Note   I, Darren [name], connected with  Darren [name]  ([id number], Mar 13, 1945) on 03/09/21 at 11:45 AM EST by a video-enabled telemedicine application and verified that I am speaking with the correct person using two identifiers.  Location: Patient: Virtual Visit Location Patient: Home Provider: Virtual Visit Location Provider: Home Office   I discussed the limitations of evaluation and  management by telemedicine and the availability of in person appointments. The patient expressed understanding and agreed to proceed.    History of Present Illness: Darren [name] is a 76 y.o. who identifies as a male who was assigned male at birth, and is being seen today for COVID-19. Notes symptoms starting Monday night with just fatigue. Yesterday during the day he developed nasal congestion, rhinorrhea and last night had severe fatigue with worsening URI symptoms including the development of a cough and low-grade fever.  This morning he was feeling rough and so decided to take a home COVID test which was positive. Denies chest pain or SOB. Some nausea without other GI symptoms. Has taken ASA for fever.  HPI: HPI  Problems:  Patient Active Problem List   Diagnosis Date Noted   Current use of proton pump inhibitor 10/28/2020   Left knee pain 05/05/2020   Exposure to communicable disease 08/20/2018   Elevated glucose level 09/19/2016   Nasal congestion 09/19/2016   Encounter for Medicare annual wellness exam 08/18/2014   Routine general medical examination at a health care facility 08/10/2014   PERSONAL HISTORY OF COLONIC POLYPS 09/21/2009   Hyperlipidemia 12/02/2008   GERD 12/02/2008   ANXIETY DISORDER, SITUATIONAL, MILD 04/03/2008   BPH (benign prostatic hyperplasia) 10/22/2006    Allergies:  Allergies  Allergen Reactions   Hydrocodone Nausea Only    Hydrocodone  caused terrible nausea.   Penicillin G Other (See Comments)    Reacted as a child. Unsure what happened.    Penicillins     REACTION: allergy as a child   Medications:  Current Outpatient Medications:    ALPRAZolam (XANAX) 0.5 MG tablet, Take 1 by mouth up to twice daily for airplane flight, Disp: 15 tablet, Rfl: 0   bismuth subsalicylate (PEPTO BISMOL) 262 MG/15ML suspension, Take 30 mLs by mouth as needed., Disp: , Rfl:    calcium carbonate (TUMS EX) 750 MG chewable tablet, Chew 2-3 tablets by mouth at bedtime as  needed for heartburn., Disp: , Rfl:    famotidine (PEPCID) 20 MG tablet, Take 40 mg by mouth at bedtime. , Disp: , Rfl:    Methylcellulose, Laxative, (CITRUCEL PO), Take 2 tablets by mouth daily., Disp: , Rfl:    MYRBETRIQ 50 MG TB24 tablet, Take 50 mg by mouth daily., Disp: , Rfl: 0   omeprazole (PRILOSEC OTC) 20 MG tablet, Take 20 mg by mouth every morning. , Disp: , Rfl:    tadalafil (CIALIS) 5 MG tablet, Take 5 mg by mouth daily as needed for erectile dysfunction., Disp: , Rfl:   Observations/Objective: Patient is well-developed, well-nourished in no acute distress.  Resting comfortable at home.  Head is normocephalic, atraumatic.  No labored breathing. Speech is clear and coherent with logical content.  Patient is alert and oriented at baseline.   Assessment and Plan: 1. COVID-19 - MyChart COVID-19 home monitoring program; Future  Patient with multiple risk factors for complicated course of illness. Discussed risks/benefits of antiviral medications including most common potential ADRs. Patient voiced understanding and would like to proceed with antiviral medication. They are candidate for Paxlovid. GFR 81. Rx sent to pharmacy. Supportive measures, OTC medications and vitamin regimen reviewed. Tessalon per orders. Patient has been enrolled in a MyChart COVID symptom monitoring program. Samule Dry reviewed in detail. Strict ER precautions discussed with patient.    Follow Up Instructions: I discussed the assessment and treatment plan with the patient. The patient was provided an opportunity to ask questions and all were answered. The patient agreed with the plan and demonstrated an understanding of the instructions.  A copy of instructions were sent to the patient via MyChart unless otherwise noted below.    The patient was advised to call back or seek an in-person evaluation if the symptoms worsen or if the condition fails to improve as anticipated.  Time:  I spent 12 minutes with  the patient via telehealth technology discussing the above problems/concerns.    Darren Rio, PA-C

## 2021-04-27 DIAGNOSIS — R350 Frequency of micturition: Secondary | ICD-10-CM | POA: Diagnosis not present

## 2021-04-27 DIAGNOSIS — N401 Enlarged prostate with lower urinary tract symptoms: Secondary | ICD-10-CM | POA: Diagnosis not present

## 2021-04-27 DIAGNOSIS — N5201 Erectile dysfunction due to arterial insufficiency: Secondary | ICD-10-CM | POA: Diagnosis not present

## 2021-08-02 ENCOUNTER — Telehealth: Payer: Self-pay

## 2021-08-02 DIAGNOSIS — L989 Disorder of the skin and subcutaneous tissue, unspecified: Secondary | ICD-10-CM

## 2021-08-02 NOTE — Telephone Encounter (Signed)
Patient called in asking for a referral to a dermatologist, as he had one in [place] but the doctor moved out of state and would like someone in [place].

## 2021-08-02 NOTE — Telephone Encounter (Signed)
Called and spoke with patient he said that she notice that a wart or knot that come up on his left side of his face that hard and has some basil skin cell that needs to removed, pt want to know if he can  see some in [place].

## 2021-08-03 DIAGNOSIS — L989 Disorder of the skin and subcutaneous tissue, unspecified: Secondary | ICD-10-CM | POA: Insufficient documentation

## 2021-08-03 NOTE — Addendum Note (Signed)
Addended by: Loura Pardon A on: 08/03/2021 01:44 PM   Modules accepted: Orders

## 2021-08-03 NOTE — Telephone Encounter (Signed)
Referral is done  Call if he does not hear in 1-2 weeks   It may take a while to get in

## 2021-08-31 DIAGNOSIS — L821 Other seborrheic keratosis: Secondary | ICD-10-CM | POA: Diagnosis not present

## 2021-10-05 DIAGNOSIS — Z961 Presence of intraocular lens: Secondary | ICD-10-CM | POA: Diagnosis not present

## 2021-10-25 ENCOUNTER — Other Ambulatory Visit: Payer: Medicare PPO

## 2021-10-27 ENCOUNTER — Telehealth: Payer: Self-pay | Admitting: Family Medicine

## 2021-10-27 DIAGNOSIS — N400 Enlarged prostate without lower urinary tract symptoms: Secondary | ICD-10-CM

## 2021-10-27 DIAGNOSIS — E7800 Pure hypercholesterolemia, unspecified: Secondary | ICD-10-CM

## 2021-10-27 DIAGNOSIS — Z0000 Encounter for general adult medical examination without abnormal findings: Secondary | ICD-10-CM

## 2021-10-27 DIAGNOSIS — R7309 Other abnormal glucose: Secondary | ICD-10-CM

## 2021-10-27 NOTE — Telephone Encounter (Signed)
-----   Message from Ellamae Sia sent at 10/12/2021  3:58 PM EDT ----- Regarding: Lab orders for Friday, 10.6.23 Patient is scheduled for CPX labs, please order future labs, Thanks , Karna Christmas

## 2021-10-28 ENCOUNTER — Other Ambulatory Visit (INDEPENDENT_AMBULATORY_CARE_PROVIDER_SITE_OTHER): Payer: Medicare PPO

## 2021-10-28 DIAGNOSIS — R7309 Other abnormal glucose: Secondary | ICD-10-CM | POA: Diagnosis not present

## 2021-10-28 DIAGNOSIS — N400 Enlarged prostate without lower urinary tract symptoms: Secondary | ICD-10-CM

## 2021-10-28 DIAGNOSIS — E7800 Pure hypercholesterolemia, unspecified: Secondary | ICD-10-CM

## 2021-10-28 DIAGNOSIS — Z0000 Encounter for general adult medical examination without abnormal findings: Secondary | ICD-10-CM

## 2021-10-28 LAB — LIPID PANEL
Cholesterol: 190 mg/dL (ref 0–200)
HDL: 58.7 mg/dL (ref >39.00)
LDL Cholesterol: 117 mg/dL — ABNORMAL HIGH (ref 0–99)
NonHDL: 130.95
Total CHOL/HDL Ratio: 3
Triglycerides: 72 mg/dL (ref 0.0–149.0)
VLDL: 14.4 mg/dL (ref 0.0–40.0)

## 2021-10-28 LAB — COMPREHENSIVE METABOLIC PANEL
ALT: 17 U/L (ref 0–53)
AST: 16 U/L (ref 0–37)
Albumin: 4 g/dL (ref 3.5–5.2)
Alkaline Phosphatase: 40 U/L (ref 39–117)
BUN: 18 mg/dL (ref 6–23)
CO2: 29 mEq/L (ref 19–32)
Calcium: 9.2 mg/dL (ref 8.4–10.5)
Chloride: 103 mEq/L (ref 96–112)
Creatinine, Ser: 0.94 mg/dL (ref 0.40–1.50)
GFR: 78.64 mL/min (ref >60.00)
Glucose, Bld: 99 mg/dL (ref 70–99)
Potassium: 4.3 mEq/L (ref 3.5–5.1)
Sodium: 138 mEq/L (ref 135–145)
Total Bilirubin: 0.7 mg/dL (ref 0.2–1.2)
Total Protein: 6.1 g/dL (ref 6.0–8.3)

## 2021-10-28 LAB — CBC WITH DIFFERENTIAL/PLATELET
Basophils Absolute: 0 10*3/uL (ref 0.0–0.1)
Basophils Relative: 1 % (ref 0.0–3.0)
Eosinophils Absolute: 0.2 10*3/uL (ref 0.0–0.7)
Eosinophils Relative: 3.8 % (ref 0.0–5.0)
HCT: 40.6 % (ref 39.0–52.0)
Hemoglobin: 13.7 g/dL (ref 13.0–17.0)
Lymphocytes Relative: 26.8 % (ref 12.0–46.0)
Lymphs Abs: 1.2 10*3/uL (ref 0.7–4.0)
MCHC: 33.9 g/dL (ref 30.0–36.0)
MCV: 99.5 fl (ref 78.0–100.0)
Monocytes Absolute: 0.5 10*3/uL (ref 0.1–1.0)
Monocytes Relative: 12.4 % — ABNORMAL HIGH (ref 3.0–12.0)
Neutro Abs: 2.4 10*3/uL (ref 1.4–7.7)
Neutrophils Relative %: 56 % (ref 43.0–77.0)
Platelets: 166 10*3/uL (ref 150.0–400.0)
RBC: 4.08 Mil/uL — ABNORMAL LOW (ref 4.22–5.81)
RDW: 12.3 % (ref 11.5–15.5)
WBC: 4.3 10*3/uL (ref 4.0–10.5)

## 2021-10-28 LAB — TSH: TSH: 2.59 u[IU]/mL (ref 0.35–5.50)

## 2021-10-28 LAB — PSA, MEDICARE: PSA: 0.55 ng/ml (ref 0.10–4.00)

## 2021-10-28 LAB — HEMOGLOBIN A1C: Hgb A1c MFr Bld: 5.6 % (ref 4.6–6.5)

## 2021-11-01 ENCOUNTER — Ambulatory Visit (INDEPENDENT_AMBULATORY_CARE_PROVIDER_SITE_OTHER): Payer: Medicare PPO | Admitting: Family Medicine

## 2021-11-01 ENCOUNTER — Encounter: Payer: Self-pay | Admitting: Family Medicine

## 2021-11-01 VITALS — BP 124/66 | HR 70 | Temp 97.1°F | Ht 71.5 in | Wt 211.2 lb

## 2021-11-01 DIAGNOSIS — N400 Enlarged prostate without lower urinary tract symptoms: Secondary | ICD-10-CM

## 2021-11-01 DIAGNOSIS — Z79899 Other long term (current) drug therapy: Secondary | ICD-10-CM

## 2021-11-01 DIAGNOSIS — E7800 Pure hypercholesterolemia, unspecified: Secondary | ICD-10-CM | POA: Diagnosis not present

## 2021-11-01 DIAGNOSIS — K219 Gastro-esophageal reflux disease without esophagitis: Secondary | ICD-10-CM

## 2021-11-01 DIAGNOSIS — Z1211 Encounter for screening for malignant neoplasm of colon: Secondary | ICD-10-CM

## 2021-11-01 DIAGNOSIS — Z0000 Encounter for general adult medical examination without abnormal findings: Secondary | ICD-10-CM | POA: Diagnosis not present

## 2021-11-01 DIAGNOSIS — R7309 Other abnormal glucose: Secondary | ICD-10-CM

## 2021-11-01 DIAGNOSIS — M25551 Pain in right hip: Secondary | ICD-10-CM | POA: Diagnosis not present

## 2021-11-01 NOTE — Assessment & Plan Note (Signed)
Lateral hip pain  No help with exercises  Planning appt with sport med

## 2021-11-01 NOTE — Patient Instructions (Addendum)
Add some muscle resistance training to your regimen   I ordered the cologuard test  If you don't get a call or email from the company in 2 weeks let us know   For cholesterol Avoid red meat/ fried foods/ egg yolks/ fatty breakfast meats/ butter, cheese and high fat dairy/ and shellfish   Limit red meat to once per month if you can  Avoid fried and greasy foods

## 2021-11-01 NOTE — Assessment & Plan Note (Signed)
Enc B12 and D supplementation  He takes mvi as well

## 2021-11-01 NOTE — Assessment & Plan Note (Signed)
Mild but worth watching  Disc goals for lipids and reasons to control them Rev last labs with pt Rev low sat fat diet in detail  Goal LDL under 100  Will work on diet - red meat and cheese Consider statin later if needed

## 2021-11-01 NOTE — Assessment & Plan Note (Signed)
colonoscpy utd 2017 -no polyps  Interested in screening further  cologuard ordered  If healthy for colonsocopy if it was needed later

## 2021-11-01 NOTE — Assessment & Plan Note (Signed)
Cannot wean his omeprazole 20 mg daily (was bid in past) pepcid bid also  Disc suppl B12 and D  Diet control as well

## 2021-11-01 NOTE — Assessment & Plan Note (Signed)
Reviewed health habits including diet and exercise and skin cancer prevention Reviewed appropriate screening tests for age  Also reviewed health mt list, fam hx and immunization status , as well as social and family history   See HPI Labs reviewed  utd vaccines incl RSV psa is stable, under urology care phq of 0 cologuard ordered (no recall for colonoscopy) Strongly enc more exercise incl strength training

## 2021-11-01 NOTE — Assessment & Plan Note (Signed)
Lab Results  Component Value Date   HGBA1C 5.6 10/28/2021   disc imp of low glycemic diet and wt loss to prevent DM2

## 2021-11-01 NOTE — Assessment & Plan Note (Signed)
Lab Results  Component Value Date   PSA 0.55 10/28/2021   PSA 0.52 10/28/2020   PSA 0.52 10/10/2019    utd urology care  On myrberiq

## 2021-11-01 NOTE — Progress Notes (Signed)
Subjective:    Patient ID: Darren [name], male    DOB: [birth year], 76 y.o.   MRN: [id number]  HPI Here for health maintenance exam and to review chronic medical problems    Wt Readings from Last 3 Encounters:  11/01/21 211 lb 4 oz (95.8 kg)  10/28/20 208 lb (94.3 kg)  05/05/20 219 lb 6 oz (99.5 kg)   29.05 kg/m  Doing very well  Busy  Had a good [place] trip   Feeling good Taking care of himself  Walking for exercise  May join the gym  Needs to add more      Immunization History  Administered Date(s) Administered   Fluad Quad(high Dose 65+) 10/01/2018, 10/14/2019   H1N1 02/06/2008   Hepatitis A 11/26/2003, 06/24/2004   Influenza Split 10/26/2010, 10/23/2011   Influenza Whole 12/15/2003, 11/16/2006, 10/27/2008, 10/14/2009   Influenza, High Dose Seasonal PF 09/24/2018, 10/28/2021   Influenza,inj,Quad PF,6+ Mos 10/09/2012, 10/29/2013, 10/13/2014, 10/29/2015, 11/10/2016, 10/05/2017   PFIZER(Purple Top)SARS-COV-2 Vaccination 02/27/2019, 03/24/2019   Pfizer Covid-19 Vaccine Bivalent Booster 43yr & up 10/17/2021   Pneumococcal Conjugate-13 09/17/2015   Pneumococcal Polysaccharide-23 09/09/2007, 05/13/2013   Respiratory Syncytial Virus Vaccine,Recomb Aduvanted(Arexvy) 09/27/2021   Td 07/29/1998, 12/02/2008   Zoster Recombinat (Shingrix) 11/27/2017, 02/11/2018   Zoster, Live 09/28/2005    Health Maintenance Due  Topic Date Due   COVID-19 Vaccine (3 - Pfizer risk series) 04/21/2019   INFLUENZA VACCINE  08/23/2021   Flu shot - had  Covid - had  RSV - had   Prostate health H/o BPH Lab Results  Component Value Date   PSA 0.55 10/28/2021   PSA 0.52 10/28/2020   PSA 0.52 10/10/2019  Sees urology - April and has appt this April  Takes myrbetiq -it helps urinary symptoms /may be change to gemtasa  Also cialis   Colonoscopy 02/1015  No polyps  Interested in cologuard   Mood PHQ 0  Occ xanax   BP Readings from Last 3 Encounters:  11/01/21 124/66   10/28/20 128/82  05/05/20 136/78   Pulse Readings from Last 3 Encounters:  11/01/21 70  10/28/20 (!) 58  05/05/20 83    GERD Omeprazole 20 mg daily (in the past bid)  Absolutely cannot go without ppi  Pepcid '20mg'$  at bedtime and very early am (when he wakes up to go to the bathroom) Does not always need the 2nd dose   Has not had to take any tums for a while Doing well   Takes extra B12 and extra D Centrum silver   Hearing aides - interested to try otc ones  Has to have people repeat occ   Is having hip problems- was here in 2021 for R hip pain  Has done some exercises   Lab Results  Component Value Date   CREATININE 0.94 10/28/2021   BUN 18 10/28/2021   NA 138 10/28/2021   K 4.3 10/28/2021   CL 103 10/28/2021   CO2 29 10/28/2021     Lab Results  Component Value Date   VITAMINB12 226 10/28/2020  Takes mvi daily  Glucose Lab Results  Component Value Date   HGBA1C 5.6 10/28/2021  Is good about sugar intake   Hyperlipidemia Lab Results  Component Value Date   CHOL 190 10/28/2021   CHOL 194 10/28/2020   CHOL 183 10/10/2019   Lab Results  Component Value Date   HDL 58.70 10/28/2021   HDL 66.40 10/28/2020   HDL 58.20 10/10/2019   Lab Results  Component  Value Date   LDLCALC 117 (H) 10/28/2021   LDLCALC 109 (H) 10/28/2020   LDLCALC 110 (H) 10/10/2019   Lab Results  Component Value Date   TRIG 72.0 10/28/2021   TRIG 89.0 10/28/2020   TRIG 78.0 10/10/2019   Lab Results  Component Value Date   CHOLHDL 3 10/28/2021   CHOLHDL 3 10/28/2020   CHOLHDL 3 10/10/2019   Lab Results  Component Value Date   LDLDIRECT 127.7 02/15/2009   LDLDIRECT 144.2 12/02/2008   LDLDIRECT 134.0 03/21/2007   Diet controlled Red meat every 2 weeks  Some cheese   No young vascular disease in family  Father died suddenly over age 24      The 10-year ASCVD risk score (Arnett DK, et al., 2019) is: 23.6%   Values used to calculate the score:     Age: 33 years      Sex: Male     Is Non-Hispanic African American: No     Diabetic: No     Tobacco smoker: No     Systolic Blood Pressure: 026 mmHg     Is BP treated: No     HDL Cholesterol: 58.7 mg/dL     Total Cholesterol: 190 mg/dL  Lab Results  Component Value Date   WBC 4.3 10/28/2021   HGB 13.7 10/28/2021   HCT 40.6 10/28/2021   MCV 99.5 10/28/2021   PLT 166.0 10/28/2021   Lab Results  Component Value Date   TSH 2.59 10/28/2021   Lab Results  Component Value Date   ALT 17 10/28/2021   AST 16 10/28/2021   ALKPHOS 40 10/28/2021   BILITOT 0.7 10/28/2021    Patient Active Problem List   Diagnosis Date Noted   Colon cancer screening 11/01/2021   Right hip pain 11/01/2021   Current use of proton pump inhibitor 10/28/2020   Left knee pain 05/05/2020   Exposure to communicable disease 08/20/2018   Elevated glucose level 09/19/2016   Nasal congestion 09/19/2016   Encounter for Medicare annual wellness exam 08/18/2014   Routine general medical examination at a health care facility 08/10/2014   PERSONAL HISTORY OF COLONIC POLYPS 09/21/2009   Hyperlipidemia 12/02/2008   GERD 12/02/2008   ANXIETY DISORDER, SITUATIONAL, MILD 04/03/2008   BPH (benign prostatic hyperplasia) 10/22/2006   Past Medical History:  Diagnosis Date   Basal cell carcinoma    face   GERD (gastroesophageal reflux disease)    History of colon polyps    Hypertrophy of prostate without urinary obstruction and other lower urinary tract symptoms (LUTS)    Leukocytopenia, unspecified    Other and unspecified hyperlipidemia    no meds   Overactive bladder    Past Surgical History:  Procedure Laterality Date   COLONOSCOPY     multiple - polyps   PROSTATE SURGERY  08/2012   TURP  at [place] Right 2003   UPPER GI ENDOSCOPY     WISDOM TOOTH EXTRACTION     Social History   Tobacco Use   Smoking status: Former    Packs/day: 1.00    Years: 5.00    Total pack years: 5.00    Types: Cigarettes     Quit date: 01/24/1968    Years since quitting: 53.8   Smokeless tobacco: Never  Vaping Use   Vaping Use: Never used  Substance Use Topics   Alcohol use: Yes    Alcohol/week: 14.0 - 21.0 standard drinks of alcohol    Types: 14 -  21 Glasses of wine per week    Comment: 2-3 glasses wine/day   Drug use: No   Family History  Problem Relation Age of Onset   Heart attack Father    Breast cancer Mother    Thyroid cancer Maternal Grandmother    Colon cancer Neg Hx    Esophageal cancer Neg Hx    Rectal cancer Neg Hx    Stomach cancer Neg Hx    Allergies  Allergen Reactions   Hydrocodone Nausea Only    Hydrocodone caused terrible nausea.   Penicillin G Other (See Comments)    Reacted as a child. Unsure what happened.    Penicillins     REACTION: allergy as a child   Current Outpatient Medications on File Prior to Visit  Medication Sig Dispense Refill   ALPRAZolam (XANAX) 0.5 MG tablet Take 1 by mouth up to twice daily for airplane flight 15 tablet 0   famotidine (PEPCID) 20 MG tablet Take 40 mg by mouth at bedtime.      Methylcellulose, Laxative, (CITRUCEL PO) Take 2 tablets by mouth daily.     Multiple Vitamins-Minerals (CENTRUM SILVER 50+MEN PO) Take 1 Capful by mouth daily.     MYRBETRIQ 50 MG TB24 tablet Take 50 mg by mouth daily.  0   omeprazole (PRILOSEC OTC) 20 MG tablet Take 20 mg by mouth every morning.      tadalafil (CIALIS) 5 MG tablet Take 5 mg by mouth daily as needed for erectile dysfunction.     No current facility-administered medications on file prior to visit.     Review of Systems  Constitutional:  Negative for activity change, appetite change, fatigue, fever and unexpected weight change.  HENT:  Positive for hearing loss. Negative for congestion, rhinorrhea, sore throat and trouble swallowing.   Eyes:  Negative for pain, redness, itching and visual disturbance.  Respiratory:  Negative for cough, chest tightness, shortness of breath and wheezing.    Cardiovascular:  Negative for chest pain and palpitations.  Gastrointestinal:  Negative for abdominal pain, blood in stool, constipation, diarrhea and nausea.  Endocrine: Negative for cold intolerance, heat intolerance, polydipsia and polyuria.  Genitourinary:  Negative for difficulty urinating, dysuria, frequency and urgency.  Musculoskeletal:  Positive for arthralgias. Negative for joint swelling and myalgias.  Skin:  Negative for pallor and rash.  Neurological:  Negative for dizziness, tremors, weakness, numbness and headaches.  Hematological:  Negative for adenopathy. Does not bruise/bleed easily.  Psychiatric/Behavioral:  Negative for decreased concentration and dysphoric mood. The patient is not nervous/anxious.        Objective:   Physical Exam Constitutional:      General: He is not in acute distress.    Appearance: Normal appearance. He is well-developed. He is not ill-appearing or diaphoretic.     Comments: Overweight   HENT:     Head: Normocephalic and atraumatic.     Right Ear: Tympanic membrane, ear canal and external ear normal.     Left Ear: Tympanic membrane, ear canal and external ear normal.     Ears:     Comments: Hearing impaired mild  Scant cerumen     Nose: Nose normal. No congestion.     Mouth/Throat:     Mouth: Mucous membranes are moist.     Pharynx: Oropharynx is clear. No posterior oropharyngeal erythema.  Eyes:     General: No scleral icterus.       Right eye: No discharge.        Left  eye: No discharge.     Conjunctiva/sclera: Conjunctivae normal.     Pupils: Pupils are equal, round, and reactive to light.  Neck:     Thyroid: No thyromegaly.     Vascular: No carotid bruit or JVD.  Cardiovascular:     Rate and Rhythm: Normal rate and regular rhythm.     Pulses: Normal pulses.     Heart sounds: Normal heart sounds.     No gallop.  Pulmonary:     Effort: Pulmonary effort is normal. No respiratory distress.     Breath sounds: Normal breath  sounds. No wheezing or rales.     Comments: Good air exch Chest:     Chest wall: No tenderness.  Abdominal:     General: Bowel sounds are normal. There is no distension or abdominal bruit.     Palpations: Abdomen is soft. There is no mass.     Tenderness: There is no abdominal tenderness.     Hernia: No hernia is present.  Musculoskeletal:        General: No tenderness.     Cervical back: Normal range of motion and neck supple. No rigidity. No muscular tenderness.     Right lower leg: No edema.     Left lower leg: No edema.     Comments: No kyphosis   Lymphadenopathy:     Cervical: No cervical adenopathy.  Skin:    General: Skin is warm and dry.     Coloration: Skin is not pale.     Findings: No erythema or rash.     Comments: Few lentigines and angiomas fair  Neurological:     Mental Status: He is alert.     Cranial Nerves: No cranial nerve deficit.     Motor: No abnormal muscle tone.     Coordination: Coordination normal.     Gait: Gait normal.     Deep Tendon Reflexes: Reflexes are normal and symmetric. Reflexes normal.  Psychiatric:        Mood and Affect: Mood normal.        Cognition and Memory: Cognition and memory normal.           Assessment & Plan:   Problem List Items Addressed This Visit       Digestive   GERD    Cannot wean his omeprazole 20 mg daily (was bid in past) pepcid bid also  Disc suppl B12 and D  Diet control as well        Genitourinary   BPH (benign prostatic hyperplasia)    Lab Results  Component Value Date   PSA 0.55 10/28/2021   PSA 0.52 10/28/2020   PSA 0.52 10/10/2019   utd urology care  On myrberiq         Other   Colon cancer screening    colonoscpy utd 2017 -no polyps  Interested in screening further  cologuard ordered  If healthy for colonsocopy if it was needed later       Relevant Orders   Cologuard   Current use of proton pump inhibitor    Enc B12 and D supplementation  He takes mvi as well        Elevated glucose level    Lab Results  Component Value Date   HGBA1C 5.6 10/28/2021  disc imp of low glycemic diet and wt loss to prevent DM2       Hyperlipidemia    Mild but worth watching  Disc goals for lipids and reasons to  control them Rev last labs with pt Rev low sat fat diet in detail  Goal LDL under 100  Will work on diet - red meat and cheese Consider statin later if needed       Right hip pain    Lateral hip pain  No help with exercises  Planning appt with sport med       Routine general medical examination at a health care facility - Primary    Reviewed health habits including diet and exercise and skin cancer prevention Reviewed appropriate screening tests for age  Also reviewed health mt list, fam hx and immunization status , as well as social and family history   See HPI Labs reviewed  utd vaccines incl RSV psa is stable, under urology care phq of 0 cologuard ordered (no recall for colonoscopy) Strongly enc more exercise incl strength training

## 2021-11-04 NOTE — Progress Notes (Unsigned)
    Darren Schubring T. Nigel Wessman, MD, [place] at [facility] [place], [address]  Phone: [phone]  FAX: [phone]  Darren [name] - 76 y.o. male  MRN [id number]  Date of Birth: October 17, 1945  Date: 11/07/2021  PCP: Abner Greenspan, MD  Referral: Abner Greenspan, MD  No chief complaint on file.  Subjective:   Darren [name] is a 76 y.o. very pleasant male patient with There is no height or weight on file to calculate BMI. who presents with the following:  Patient presents with R sided hip pain, consultation for Dr. Glori Bickers.     Review of Systems is noted in the HPI, as appropriate  Objective:   There were no vitals taken for this visit.  GEN: No acute distress; alert,appropriate. PULM: Breathing comfortably in no respiratory distress PSYCH: Normally interactive.   Laboratory and Imaging Data:  Assessment and Plan:   ***

## 2021-11-07 ENCOUNTER — Encounter: Payer: Self-pay | Admitting: Family Medicine

## 2021-11-07 ENCOUNTER — Ambulatory Visit: Payer: Medicare PPO | Admitting: Family Medicine

## 2021-11-07 VITALS — BP 130/70 | HR 62 | Temp 98.0°F | Ht 71.5 in | Wt 214.1 lb

## 2021-11-07 DIAGNOSIS — M1611 Unilateral primary osteoarthritis, right hip: Secondary | ICD-10-CM | POA: Diagnosis not present

## 2021-11-07 DIAGNOSIS — M25551 Pain in right hip: Secondary | ICD-10-CM

## 2021-11-10 ENCOUNTER — Ambulatory Visit (INDEPENDENT_AMBULATORY_CARE_PROVIDER_SITE_OTHER): Payer: Medicare PPO | Admitting: *Deleted

## 2021-11-10 DIAGNOSIS — Z1211 Encounter for screening for malignant neoplasm of colon: Secondary | ICD-10-CM | POA: Diagnosis not present

## 2021-11-10 DIAGNOSIS — Z0000 Encounter for general adult medical examination without abnormal findings: Secondary | ICD-10-CM | POA: Diagnosis not present

## 2021-11-10 NOTE — Patient Instructions (Signed)
Darren [name] , Thank you for taking time to come for your Medicare Wellness Visit. I appreciate your ongoing commitment to your health goals. Please review the following plan we discussed and let me know if I can assist you in the future.   These are the goals we discussed:  Goals      Increase physical activity     Starting 10/05/2017, I will continue to exercise for at least 60 min 5-6 days per week.      Patient Stated     10/08/18, Patient wants to get his cataract surgery done to improve his vision.     Patient Stated     10/10/2019, I will continue to walk daily for about 10,000 steps.      Weight (lb) < 200 lb (90.7 kg)     5 lbs        This is a list of the screening recommended for you and due dates:  Health Maintenance  Topic Date Due   Tetanus Vaccine  10/10/2023*   COVID-19 Vaccine (4 - Pfizer risk series) 12/12/2021   Pneumonia Vaccine  Completed   Flu Shot  Completed   Hepatitis C Screening: USPSTF Recommendation to screen - Ages 18-79 yo.  Completed   Zoster (Shingles) Vaccine  Completed   HPV Vaccine  Aged Out   Colon Cancer Screening  Discontinued  *Topic was postponed. The date shown is not the original due date.    Advanced directives: yes not on file  Conditions/risks identified:  Preventive Care 33 Years and Older, Male  Preventive care refers to lifestyle choices and visits with your health care provider that can promote health and wellness. What does preventive care include? A yearly physical exam. This is also called an annual well check. Dental exams once or twice a year. Routine eye exams. Ask your health care provider how often you should have your eyes checked. Personal lifestyle choices, including: Daily care of your teeth and gums. Regular physical activity. Eating a healthy diet. Avoiding tobacco and drug use. Limiting alcohol use. Practicing safe sex. Taking low doses of aspirin every day. Taking vitamin and mineral supplements as  recommended by your health care provider. What happens during an annual well check? The services and screenings done by your health care provider during your annual well check will depend on your age, overall health, lifestyle risk factors, and family history of disease. Counseling  Your health care provider may ask you questions about your: Alcohol use. Tobacco use. Drug use. Emotional well-being. Home and relationship well-being. Sexual activity. Eating habits. History of falls. Memory and ability to understand (cognition). Work and work Statistician. Screening  You may have the following tests or measurements: Height, weight, and BMI. Blood pressure. Lipid and cholesterol levels. These may be checked every 5 years, or more frequently if you are over 30 years old. Skin check. Lung cancer screening. You may have this screening every year starting at age 80 if you have a 30-pack-year history of smoking and currently smoke or have quit within the past 15 years. Fecal occult blood test (FOBT) of the stool. You may have this test every year starting at age 32. Flexible sigmoidoscopy or colonoscopy. You may have a sigmoidoscopy every 5 years or a colonoscopy every 10 years starting at age 88. Prostate cancer screening. Recommendations will vary depending on your family history and other risks. Hepatitis C blood test. Hepatitis B blood test. Sexually transmitted disease (STD) testing. Diabetes screening. This is done  by checking your blood sugar (glucose) after you have not eaten for a while (fasting). You may have this done every 1-3 years. Abdominal aortic aneurysm (AAA) screening. You may need this if you are a current or former smoker. Osteoporosis. You may be screened starting at age 64 if you are at high risk. Talk with your health care provider about your test results, treatment options, and if necessary, the need for more tests. Vaccines  Your health care provider may recommend  certain vaccines, such as: Influenza vaccine. This is recommended every year. Tetanus, diphtheria, and acellular pertussis (Tdap, Td) vaccine. You may need a Td booster every 10 years. Zoster vaccine. You may need this after age 59. Pneumococcal 13-valent conjugate (PCV13) vaccine. One dose is recommended after age 34. Pneumococcal polysaccharide (PPSV23) vaccine. One dose is recommended after age 31. Talk to your health care provider about which screenings and vaccines you need and how often you need them. This information is not intended to replace advice given to you by your health care provider. Make sure you discuss any questions you have with your health care provider. Document Released: 02/05/2015 Document Revised: 09/29/2015 Document Reviewed: 11/10/2014 Elsevier Interactive Patient Education  2017 [place] Prevention in the Home Falls can cause injuries. They can happen to people of all ages. There are many things you can do to make your home safe and to help prevent falls. What can I do on the outside of my home? Regularly fix the edges of walkways and driveways and fix any cracks. Remove anything that might make you trip as you walk through a door, such as a raised step or threshold. Trim any bushes or trees on the path to your home. Use bright outdoor lighting. Clear any walking paths of anything that might make someone trip, such as rocks or tools. Regularly check to see if handrails are loose or broken. Make sure that both sides of any steps have handrails. Any raised decks and porches should have guardrails on the edges. Have any leaves, snow, or ice cleared regularly. Use sand or salt on walking paths during winter. Clean up any spills in your garage right away. This includes oil or grease spills. What can I do in the bathroom? Use night lights. Install grab bars by the toilet and in the tub and shower. Do not use towel bars as grab bars. Use non-skid mats or  decals in the tub or shower. If you need to sit down in the shower, use a plastic, non-slip stool. Keep the floor dry. Clean up any water that spills on the floor as soon as it happens. Remove soap buildup in the tub or shower regularly. Attach bath mats securely with double-sided non-slip rug tape. Do not have throw rugs and other things on the floor that can make you trip. What can I do in the bedroom? Use night lights. Make sure that you have a light by your bed that is easy to reach. Do not use any sheets or blankets that are too big for your bed. They should not hang down onto the floor. Have a firm chair that has side arms. You can use this for support while you get dressed. Do not have throw rugs and other things on the floor that can make you trip. What can I do in the kitchen? Clean up any spills right away. Avoid walking on wet floors. Keep items that you use a lot in easy-to-reach places. If you need to  reach something above you, use a strong step stool that has a grab bar. Keep electrical cords out of the way. Do not use floor polish or wax that makes floors slippery. If you must use wax, use non-skid floor wax. Do not have throw rugs and other things on the floor that can make you trip. What can I do with my stairs? Do not leave any items on the stairs. Make sure that there are handrails on both sides of the stairs and use them. Fix handrails that are broken or loose. Make sure that handrails are as long as the stairways. Check any carpeting to make sure that it is firmly attached to the stairs. Fix any carpet that is loose or worn. Avoid having throw rugs at the top or bottom of the stairs. If you do have throw rugs, attach them to the floor with carpet tape. Make sure that you have a light switch at the top of the stairs and the bottom of the stairs. If you do not have them, ask someone to add them for you. What else can I do to help prevent falls? Wear shoes that: Do not  have high heels. Have rubber bottoms. Are comfortable and fit you well. Are closed at the toe. Do not wear sandals. If you use a stepladder: Make sure that it is fully opened. Do not climb a closed stepladder. Make sure that both sides of the stepladder are locked into place. Ask someone to hold it for you, if possible. Clearly mark and make sure that you can see: Any grab bars or handrails. First and last steps. Where the edge of each step is. Use tools that help you move around (mobility aids) if they are needed. These include: Canes. Walkers. Scooters. Crutches. Turn on the lights when you go into a dark area. Replace any light bulbs as soon as they burn out. Set up your furniture so you have a clear path. Avoid moving your furniture around. If any of your floors are uneven, fix them. If there are any pets around you, be aware of where they are. Review your medicines with your doctor. Some medicines can make you feel dizzy. This can increase your chance of falling. Ask your doctor what other things that you can do to help prevent falls. This information is not intended to replace advice given to you by your health care provider. Make sure you discuss any questions you have with your health care provider. Document Released: 11/05/2008 Document Revised: 06/17/2015 Document Reviewed: 02/13/2014 Elsevier Interactive Patient Education  2017 [employer].

## 2021-11-10 NOTE — Progress Notes (Signed)
Subjective:   Darren [name] is a 76 y.o. male who presents for Medicare Annual/Subsequent preventive examination.  I connected with  Darren [name] on 11/10/21 by a telephone enabled telemedicine application and verified that I am speaking with the correct person using two identifiers.   I discussed the limitations of evaluation and management by telemedicine. The patient expressed understanding and agreed to proceed.  Patient location: home  Provider location: Tele-health-home  .   Review of Systems     Cardiac Risk Factors include: advanced age (>28mn, >>67women);obesity (BMI >30kg/m2);male gender     Objective:    Today's Vitals   There is no height or weight on file to calculate BMI.     11/10/2021    1:04 PM 10/10/2019   11:20 AM 10/08/2018    3:09 PM 10/05/2017    9:59 AM 06/19/2017    9:27 AM 09/17/2015   10:14 AM 02/17/2015    1:54 PM  Advanced Directives  Does Patient Have a Medical Advance Directive? Yes Yes Yes Yes Yes Yes Yes  Type of AParamedicof [place]Living will [place]Living will HElyriaLiving will HLittle RiverLiving will HBawcomvilleLiving will HNewburyportLiving will HFoyilLiving will  Does patient want to make changes to medical advance directive?     No - Patient declined No - Patient declined   Copy of HMidwayin Chart? No - copy requested No - copy requested No - copy requested No - copy requested No - copy requested No - copy requested     Current Medications (verified) Outpatient Encounter Medications as of 11/10/2021  Medication Sig   ALPRAZolam (XANAX) 0.5 MG tablet Take 1 by mouth up to twice daily for airplane flight   famotidine (PEPCID) 20 MG tablet Take 40 mg by mouth at bedtime.    Methylcellulose, Laxative, (CITRUCEL PO) Take 2 tablets by mouth daily.   Multiple  Vitamins-Minerals (CENTRUM SILVER 50+MEN PO) Take 1 Capful by mouth daily.   MYRBETRIQ 50 MG TB24 tablet Take 50 mg by mouth daily.   omeprazole (PRILOSEC OTC) 20 MG tablet Take 20 mg by mouth every morning.    tadalafil (CIALIS) 5 MG tablet Take 5 mg by mouth daily as needed for erectile dysfunction.   No facility-administered encounter medications on file as of 11/10/2021.    Allergies (verified) Hydrocodone, Penicillin g, and Penicillins   History: Past Medical History:  Diagnosis Date   Basal cell carcinoma    face   GERD (gastroesophageal reflux disease)    History of colon polyps    Hypertrophy of prostate without urinary obstruction and other lower urinary tract symptoms (LUTS)    Leukocytopenia, unspecified    Other and unspecified hyperlipidemia    no meds   Overactive bladder    Past Surgical History:  Procedure Laterality Date   COLONOSCOPY     multiple - polyps   PROSTATE SURGERY  08/2012   [place]   [place]ER [place] ENDOS[place]     WISDOM TOOTH EXTRACTION     Family History  Problem Relation Age of Onset   Heart attack Father    Breast cancer Mother    Thyroid cancer Maternal Grandmother    Colon cancer Neg Hx    Esophageal cancer Neg Hx    Rectal cancer Neg Hx    Stomach cancer  Neg Hx    Social History   Socioeconomic History   Marital status: Married    Spouse name: Not on file   Number of children: 1   Years of education: Not on file   Highest education level: Not on file  Occupational History   Occupation: retired college professor  Tobacco Use   Smoking status: Former    Packs/day: 1.00    Years: 5.00    Total pack years: 5.00    Types: Cigarettes    Quit date: 01/24/1968    Years since quitting: 53.8   Smokeless tobacco: Never  Vaping Use   Vaping Use: Never used  Substance and Sexual Activity   Alcohol use: Yes    Alcohol/week: 14.0 - 21.0 standard drinks of alcohol    Types: 14 - 21 Glasses of wine per  week    Comment: 2-3 glasses wine/day   Drug use: No   Sexual activity: Yes  Other Topics Concern   Not on file  Social History Narrative   Not on file   Social Determinants of Health   Financial Resource Strain: Low Risk  (11/10/2021)   Overall Financial Resource Strain (CARDIA)    Difficulty of Paying Living Expenses: Not hard at all  Food Insecurity: No Food Insecurity (11/10/2021)   Hunger Vital Sign    Worried About Running Out of Food in the Last Year: Never true    Ran Out of Food in the Last Year: Never true  Transportation Needs: No Transportation Needs (11/10/2021)   PRAPARE - Hydrologist (Medical): No    Lack of Transportation (Non-Medical): No  Physical Activity: Sufficiently Active (11/10/2021)   Exercise Vital Sign    Days of Exercise per Week: 5 days    Minutes of Exercise per Session: 40 min  Stress: No Stress Concern Present (11/10/2021)   [place]    Feeling of Stress : Not at all  Social Connections: Moderately Integrated (11/10/2021)   Social Connection and Isolation Panel [NHANES]    Frequency of Communication with Friends and Family: Three times a week    Frequency of Social Gatherings with Friends and Family: Twice a week    Attends Religious Services: More than 4 times per year    Active Member of [employer] or Organizations: No    Attends Music therapist: Never    Marital Status: Married    Tobacco Counseling Counseling given: Not Answered   Clinical Intake:  Pre-visit preparation completed: No  Pain : No/denies pain     Diabetes: No  How often do you need to have someone help you when you read instructions, pamphlets, or other written materials from your doctor or pharmacy?: 1 - Never  Diabetic?  no  Interpreter Needed?: No  Information entered by :: Leroy Kennedy LPN   Activities of Daily Living    11/10/2021    1:11 PM   In your present state of health, do you have any difficulty performing the following activities:  Hearing? 1  Vision? 0  Difficulty concentrating or making decisions? 0  Walking or climbing stairs? 0  Dressing or bathing? 0  Doing errands, shopping? 0  Preparing Food and eating ? N  Using the Toilet? N  In the past six months, have you accidently leaked urine? N  Do you have problems with loss of bowel control? N  Managing your Medications? N  Managing your Finances? N  Housekeeping or managing your Housekeeping? N    Patient Care Team: Tower, Wynelle Fanny, MD as PCP - General  Indicate any recent Medical Services you may have received from other than [facility] providers in the past year (date may be approximate).     Assessment:   This is a routine wellness examination for Jerren.  Hearing/Vision screen Hearing Screening - Comments:: Might try over the counter hearing aids Has discussed with doctor Vision Screening - Comments:: Up to date Glennon Mac  Dietary issues and exercise activities discussed: Current Exercise Habits: Home exercise routine, Type of exercise: walking;strength training/weights, Time (Minutes): 40, Frequency (Times/Week): 4, Weekly Exercise (Minutes/Week): 160, Intensity: Mild   Goals Addressed             This Visit's Progress    Patient Stated   On track    10/08/18, Patient wants to get his cataract surgery done to improve his vision.     Patient Stated   On track    10/10/2019, I will continue to walk daily for about 10,000 steps.      Weight (lb) < 200 lb (90.7 kg)       5 lbs       Depression Screen    11/10/2021    1:16 PM 11/10/2021    1:09 PM 11/01/2021    9:53 AM 10/28/2020    9:00 AM 10/10/2019   11:22 AM 10/08/2018    3:11 PM 10/05/2017    9:38 AM  PHQ 2/9 Scores  PHQ - 2 Score 0 0 0 0 0 0 0  PHQ- 9 Score 0 0 0  0 0 0    Fall Risk    11/10/2021    1:04 PM 11/01/2021    9:53 AM 10/28/2020    9:02 AM 10/10/2019   11:21 AM 10/08/2018     3:11 PM  Fall Risk   Falls in the past year? 0 0 0 0 0  Number falls in past yr: 0  0 0 0  Injury with Fall? 0  0 0   Risk for fall due to :    No Fall Risks Medication side effect  Follow up Falls evaluation completed;Education provided;Falls prevention discussed Falls evaluation completed  Falls evaluation completed;Falls prevention discussed Falls evaluation completed;Falls prevention discussed    FALL RISK PREVENTION PERTAINING TO THE HOME:  Any stairs in or around the home? Yes  If so, are there any without handrails? No  Home free of loose throw rugs in walkways, pet beds, electrical cords, etc? Yes  Adequate lighting in your home to reduce risk of falls? Yes   ASSISTIVE DEVICES UTILIZED TO PREVENT FALLS:  Life alert? No  Use of a cane, walker or w/c? No  Grab bars in the bathroom? Yes  Shower chair or bench in shower? Yes  Elevated toilet seat or a handicapped toilet? No   TIMED UP AND GO:  Was the test performed? No .    Cognitive Function:    10/10/2019   11:23 AM 10/08/2018    3:15 PM 10/05/2017    9:38 AM 09/17/2015   10:28 AM  MMSE - Mini Mental State Exam  Orientation to time '5 5 5 5  '$ Orientation to Place '5 5 5 5  '$ Registration '3 3 3 3  '$ Attention/ Calculation 5 5 0 0  Recall '3 3 3 3  '$ Language- name 2 objects   0 0  Language- repeat '1 1 1 1  '$ Language- follow 3 step command  3 3  Language- read & follow direction   0 0  Write a sentence   0 0  Copy design   0 0  Total score   20 20        11/10/2021    1:05 PM  6CIT Screen  What Year? 0 points  What month? 0 points  What time? 0 points  Count back from 20 0 points  Months in reverse 0 points  Repeat phrase 0 points  Total Score 0 points    Immunizations Immunization History  Administered Date(s) Administered   Fluad Quad(high Dose 65+) 10/01/2018, 10/14/2019   H1N1 02/06/2008   Hepatitis A 11/26/2003, 06/24/2004   Influenza Split 10/26/2010, 10/23/2011   Influenza Whole 12/15/2003,  11/16/2006, 10/27/2008, 10/14/2009   Influenza, High Dose Seasonal PF 09/24/2018, 10/28/2021   Influenza,inj,Quad PF,6+ Mos 10/09/2012, 10/29/2013, 10/13/2014, 10/29/2015, 11/10/2016, 10/05/2017   PFIZER(Purple Top)SARS-COV-2 Vaccination 02/27/2019, 03/24/2019   Pfizer Covid-19 Vaccine Bivalent Booster 33yr & up 10/17/2021   Pneumococcal Conjugate-13 09/17/2015   Pneumococcal Polysaccharide-23 09/09/2007, 05/13/2013   Respiratory Syncytial Virus Vaccine,Recomb Aduvanted(Arexvy) 09/27/2021   Td 07/29/1998, 12/02/2008   Zoster Recombinat (Shingrix) 11/27/2017, 02/11/2018   Zoster, Live 09/28/2005    TDAP status: Due, Education has been provided regarding the importance of this vaccine. Advised may receive this vaccine at local pharmacy or Health Dept. Aware to provide a copy of the vaccination record if obtained from local pharmacy or Health Dept. Verbalized acceptance and understanding.  Flu Vaccine status: Up to date  Pneumococcal vaccine status: Up to date  Covid-19 vaccine status: Information provided on how to obtain vaccines.   Qualifies for Shingles Vaccine? No   Zostavax completed Yes   Shingrix Completed?: Yes  Screening Tests Health Maintenance  Topic Date Due   TETANUS/TDAP  10/10/2023 (Originally 12/03/2018)   COVID-19 Vaccine (4 - Pfizer risk series) 12/12/2021   Pneumonia Vaccine 76 Years old  Completed   INFLUENZA VACCINE  Completed   Hepatitis C Screening  Completed   Zoster Vaccines- Shingrix  Completed   HPV VACCINES  Aged Out   COLONOSCOPY (Pts 45-457yrInsurance coverage will need to be confirmed)  Discontinued    Health Maintenance  There are no preventive care reminders to display for this patient.  Colorectal cancer screening: No longer required.   Lung Cancer Screening: (Low Dose CT Chest recommended if Age 76-80ears, 30 pack-year currently smoking OR have quit w/in 15years.) does not qualify.   Lung Cancer Screening Referral:   Additional  Screening:  Hepatitis C Screening: does not qualify; Completed 2017  Vision Screening: Recommended annual ophthalmology exams for early detection of glaucoma and other disorders of the eye. Is the patient up to date with their annual eye exam?  Yes  Who is the provider or what is the name of the office in which the patient attends annual eye exams? [facility] pt is not established with a provider, would they like to be referred to a provider to establish care? No .   Dental Screening: Recommended annual dental exams for proper oral hygiene  Community Resource Referral / Chronic Care Management: CRR required this visit?  No   CCM required this visit?  No      Plan:     I have personally reviewed and noted the following in the patient's chart:   Medical and social history Use of alcohol, tobacco or illicit drugs  Current medications and supplements including opioid prescriptions. Patient is not currently taking opioid prescriptions.  Functional ability and status Nutritional status Physical activity Advanced directives List of other physicians Hospitalizations, surgeries, and ER visits in previous 12 months Vitals Screenings to include cognitive, depression, and falls Referrals and appointments  In addition, I have reviewed and discussed with patient certain preventive protocols, quality metrics, and best practice recommendations. A written personalized care plan for preventive services as well as general preventive health recommendations were provided to patient.     Leroy Kennedy, LPN   81/10/[id number]   Nurse Notes:

## 2021-11-16 IMAGING — DX DG KNEE COMPLETE 4+V*L*
4 series · 4 of 4 positions shown · non-contrast
Comparison: None.

CLINICAL DATA: Left knee pain 10 days.

EXAM:
LEFT KNEE - COMPLETE 4+ VIEW

[knee ap]
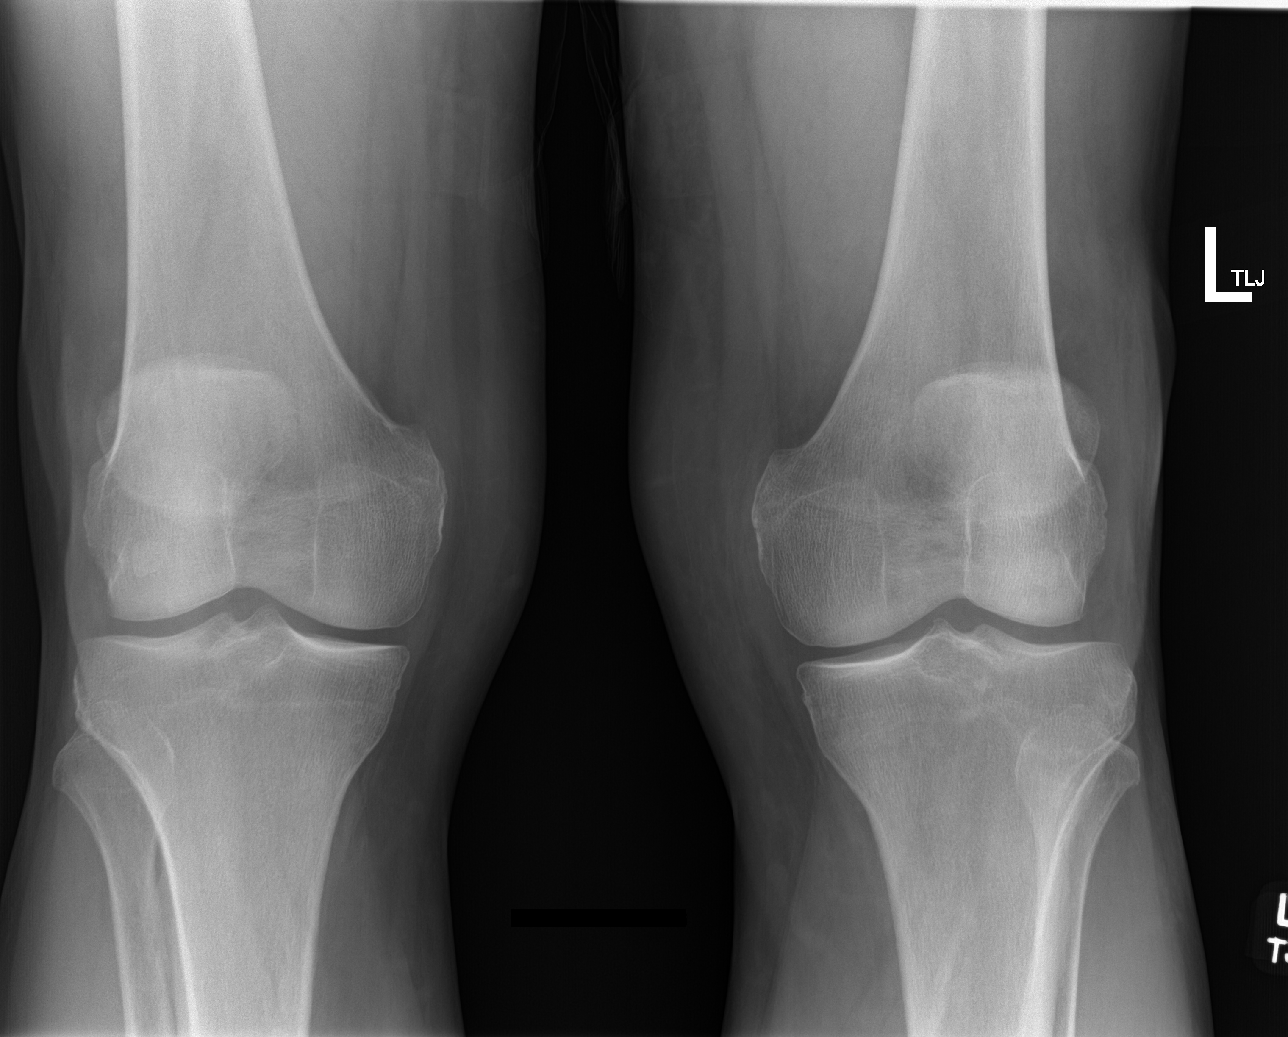

[knee tunnel]
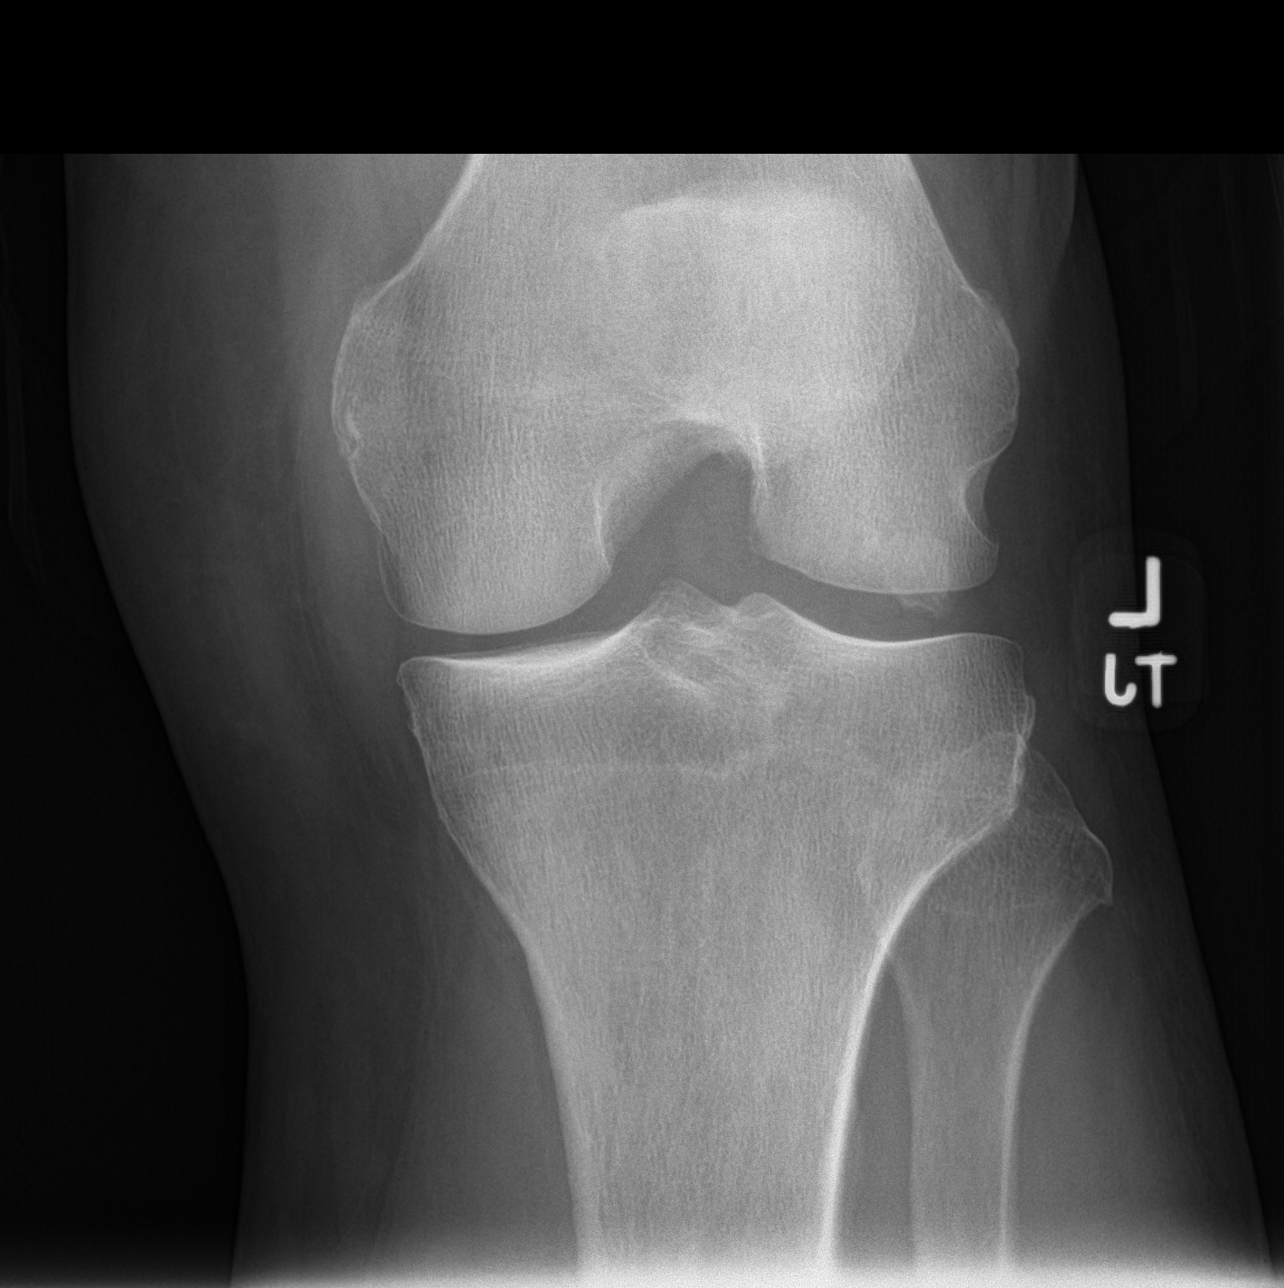

[knee lat]
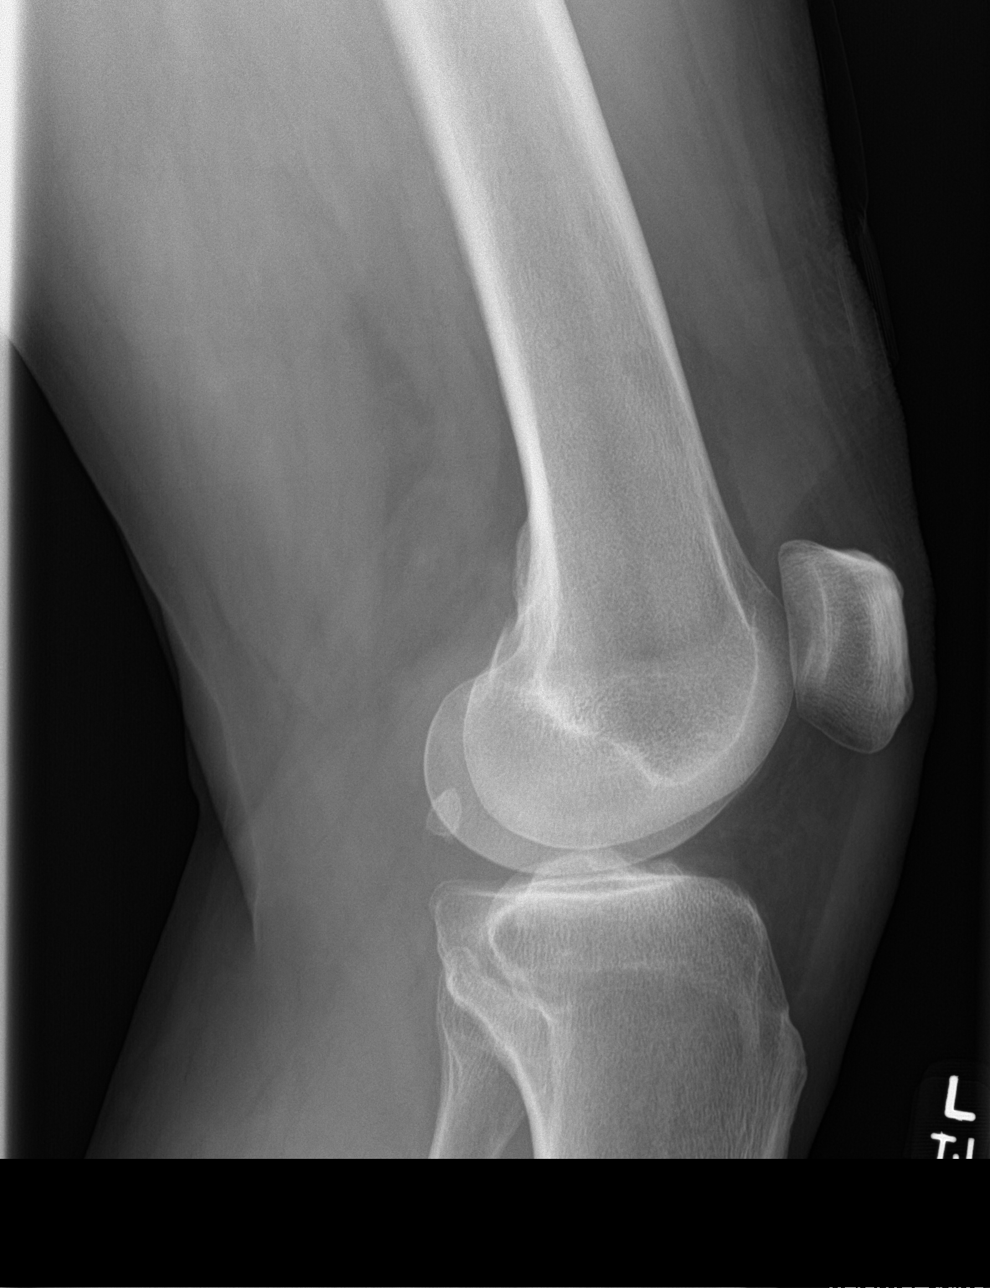

[patella skyline]
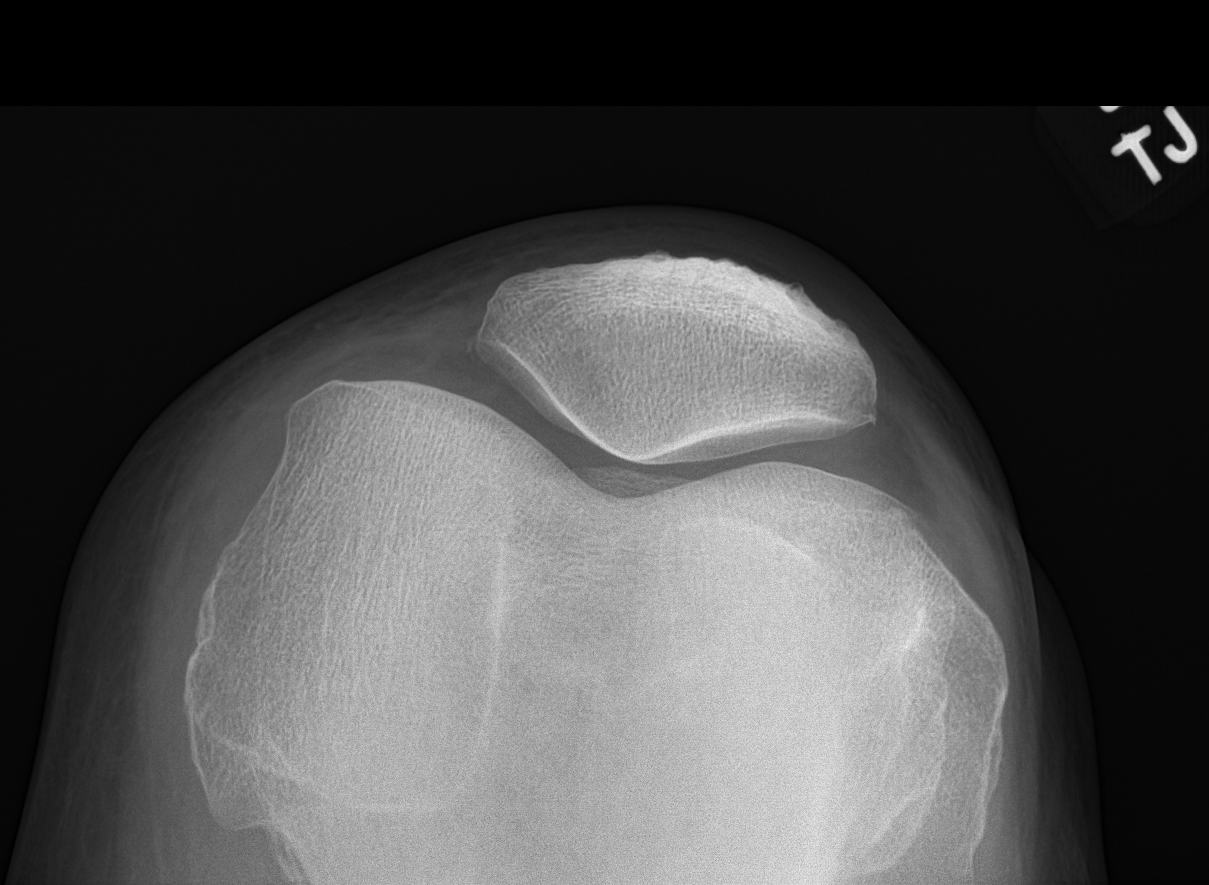

[4 of 4 positions shown; findings below may reference images not displayed]

FINDINGS: No evidence of fracture, dislocation, or joint effusion. No evidence
of arthropathy or other focal bone abnormality. Soft tissues are
unremarkable. Fabella noted.
IMPRESSION: Negative.

## 2021-11-18 ENCOUNTER — Telehealth: Payer: Self-pay | Admitting: *Deleted

## 2021-11-18 DIAGNOSIS — R195 Other fecal abnormalities: Secondary | ICD-10-CM

## 2021-11-18 LAB — COLOGUARD: COLOGUARD: POSITIVE — AB

## 2021-11-18 NOTE — Telephone Encounter (Signed)
Left VM requesting pt to call the office back 

## 2021-11-18 NOTE — Telephone Encounter (Signed)
Patient returned call,would like a call back.

## 2021-11-18 NOTE — Telephone Encounter (Signed)
-----   Message from Abner Greenspan, MD sent at 11/18/2021  8:25 AM EDT ----- Your cologuard screen is positive  We need to do a colonoscopy to make sure you do not have polyps  Would you like to do that in [place] or [place]?

## 2021-11-21 DIAGNOSIS — R195 Other fecal abnormalities: Secondary | ICD-10-CM | POA: Insufficient documentation

## 2021-11-21 NOTE — Telephone Encounter (Signed)
The referral is in.

## 2021-11-21 NOTE — Telephone Encounter (Signed)
Patient called to get a call back about the screening. Call back number [phone].

## 2021-11-21 NOTE — Telephone Encounter (Signed)
Pt viewed results on mychart. He agrees with colonoscopy. Pt has seen Dr. Owens Loffler with [facility] in the past for colonoscopies and would like to be referred back to them in [place]. Pt advised if he hasn't heard anything regarding his referral in 2 weeks to let us know. Pt verbalized understanding

## 2021-11-22 ENCOUNTER — Encounter: Payer: Self-pay | Admitting: Nurse Practitioner

## 2021-12-28 ENCOUNTER — Ambulatory Visit: Payer: Medicare PPO | Admitting: Nurse Practitioner

## 2022-02-02 ENCOUNTER — Encounter: Payer: Self-pay | Admitting: Nurse Practitioner

## 2022-02-02 ENCOUNTER — Ambulatory Visit: Payer: Medicare PPO | Admitting: Nurse Practitioner

## 2022-02-02 VITALS — BP 130/88 | HR 72 | Ht 72.0 in | Wt 213.0 lb

## 2022-02-02 DIAGNOSIS — R195 Other fecal abnormalities: Secondary | ICD-10-CM

## 2022-02-02 MED ORDER — NA SULFATE-K SULFATE-MG SULF 17.5-3.13-1.6 GM/177ML PO SOLN: 1.0000 | Freq: Once | ORAL | 0 refills | Status: AC

## 2022-02-02 NOTE — Patient Instructions (Addendum)
_______________________________________________________  If you are age 77 or older, your body mass index should be between 23-30. Your Body mass index is 28.89 kg/m. If this is out of the aforementioned range listed, please consider follow up with your Primary Care Provider.  If you are age 5 or younger, your body mass index should be between 19-25. Your Body mass index is 28.89 kg/m. If this is out of the aformentioned range listed, please consider follow up with your Primary Care Provider.   ________________________________________________________  The [facility] providers would like to encourage you to use [facility] to communicate with providers for non-urgent requests or questions.  Due to long hold times on the telephone, sending your provider a message by [facility] may be a faster and more efficient way to get a response.  Please allow 48 business hours for a response.  Please remember that this is for non-urgent requests.  _______________________________________________________  We have sent the following medications to your pharmacy for you to pick up at your convenience: [place] have been scheduled for a colonoscopy. Please follow written instructions given to you at your visit today.  Please pick up your prep supplies at the pharmacy within the next 1-3 days. If you use inhalers (even only as needed), please bring them with you on the day of your procedure.  It was a pleasure to see you today!  Thank you for trusting me with your gastrointestinal care!

## 2022-02-02 NOTE — Progress Notes (Signed)
Assessment    Patient profile:  Darren [name] is a 77 y.o. year old male , known to Dr. Ardis Hughs with a past medical history of remote adenomatous colon polyps in 2011.  See PMH / [place] for additional history. Referred by PCP for a positive Cologuard.   # Positive Cologuard Oct 2023. No bowel changes, blood in stool or anemia. No polyps on last colonoscopy in 2017, 10 year follow up colonoscopy recommended.     # GERD, asymptomatic on Pepcid and Prilosec.    Plan:    Schedule for a colonoscopy for evaluation + Cologuard. He looks and feels well. The risks and benefits of colonoscopy with possible polypectomy / biopsies were discussed and the patient agrees to proceed.    HPI:    Chief Complaint:  positive Cologuard  Positive Cologuard Oct 2023. He hasn't had any bowel changes. No blood in stool. No [place] of colon cancer.   10/28/21 hgb 13.7  He has a remote history of small colon polyps ( at least one TA in 2011) but no polyps on last colonoscopy in 2017. Ten year repeat colonoscopy was recommended.   Previous Labs / Imaging::    Latest Ref Rng & Units 10/28/2021    7:51 AM 10/28/2020    9:44 AM 10/10/2019    7:51 AM  CBC  WBC 4.0 - 10.5 K/uL 4.3  3.5  3.9   Hemoglobin 13.0 - 17.0 g/dL 13.7  14.0  13.9   Hematocrit 39.0 - 52.0 % 40.6  41.6  40.7   Platelets 150.0 - 400.0 K/uL 166.0  142.0  148.0     No results found for: "LIPASE"    Latest Ref Rng & Units 10/28/2021    7:51 AM 10/28/2020    9:44 AM 10/10/2019    7:51 AM  CMP  Glucose 70 - 99 mg/dL 99  96  92   BUN 6 - 23 mg/dL '18  13  18   '$ Creatinine 0.40 - 1.50 mg/dL 0.94  0.92  0.98   Sodium 135 - 145 mEq/L 138  139  140   Potassium 3.5 - 5.1 mEq/L 4.3  4.2  4.2   Chloride 96 - 112 mEq/L 103  104  105   CO2 19 - 32 mEq/L '29  31  30   '$ Calcium 8.4 - 10.5 mg/dL 9.2  9.5  9.4   Total Protein 6.0 - 8.3 g/dL 6.1  6.3  6.1   Total Bilirubin 0.2 - 1.2 mg/dL 0.7  1.0  0.7   Alkaline Phos 39 - 117 U/L 40  38  39   AST 0  - 37 U/L '16  15  16   '$ ALT 0 - 53 U/L '17  13  14      '$ Previous GI Evaluation   May 2019 EGD for heartburn -small hiatal hernia.   Feb 2017 colonoscopy  -diverticulosis.  Repeat in 10 years.   Imaging:  DG Knee 4 Views W/Patella Left CLINICAL DATA:  Left knee pain 10 days.  EXAM: LEFT KNEE - COMPLETE 4+ VIEW  COMPARISON:  None.  FINDINGS: No evidence of fracture, dislocation, or joint effusion. No evidence of arthropathy or other focal bone abnormality. Soft tissues are unremarkable. Fabella noted.  IMPRESSION: Negative.  Electronically Signed   By: Franchot Gallo M.D.   On: 05/07/2020 09:27   Past Medical History:  Diagnosis Date   Basal cell carcinoma    face   GERD (gastroesophageal reflux disease)  History of colon polyps    Hypertrophy of prostate without urinary obstruction and other lower urinary tract symptoms (LUTS)    Leukocytopenia, unspecified    Other and unspecified hyperlipidemia    no meds   Overactive bladder    Past Surgical History:  Procedure Laterality Date   COLONOSCOPY     multiple - polyps   PROSTATE SURGERY  08/2012   TURP  at [place] Right 2003   UPPER GI ENDOSCOPY     WISDOM TOOTH EXTRACTION     Family History  Problem Relation Age of Onset   Heart attack Father    Breast cancer Mother    Thyroid cancer Maternal Grandmother    Colon cancer Neg Hx    Esophageal cancer Neg Hx    Rectal cancer Neg Hx    Stomach cancer Neg Hx    Social History   Tobacco Use   Smoking status: Former    Packs/day: 1.00    Years: 5.00    Total pack years: 5.00    Types: Cigarettes    Quit date: 01/24/1968    Years since quitting: 54.0   Smokeless tobacco: Never  Vaping Use   Vaping Use: Never used  Substance Use Topics   Alcohol use: Yes    Alcohol/week: 14.0 - 21.0 standard drinks of alcohol    Types: 14 - 21 Glasses of wine per week    Comment: 2-3 glasses wine/day   Drug use: No   Current Outpatient  Medications  Medication Sig Dispense Refill   ALPRAZolam (XANAX) 0.5 MG tablet Take 1 by mouth up to twice daily for airplane flight 15 tablet 0   famotidine (PEPCID) 20 MG tablet Take 40 mg by mouth at bedtime.      GEMTESA 75 MG TABS Take 1 tablet by mouth daily.     Methylcellulose, Laxative, (CITRUCEL PO) Take 2 tablets by mouth daily.     Multiple Vitamins-Minerals (CENTRUM SILVER 50+MEN PO) Take 1 Capful by mouth daily.     omeprazole (PRILOSEC OTC) 20 MG tablet Take 20 mg by mouth every morning.      tadalafil (CIALIS) 5 MG tablet Take 5 mg by mouth daily as needed for erectile dysfunction.     No current facility-administered medications for this visit.   Allergies  Allergen Reactions   Hydrocodone Nausea Only    Hydrocodone caused terrible nausea.   Penicillin G Other (See Comments)    Reacted as a child. Unsure what happened.    Penicillins     REACTION: allergy as a child     Review of Systems: All other systems reviewed and negative except where noted in HPI.   Wt Readings from Last 3 Encounters:  11/07/21 214 lb 2 oz (97.1 kg)  11/01/21 211 lb 4 oz (95.8 kg)  10/28/20 208 lb (94.3 kg)    Physical Exam   BP 130/88   Pulse 72   Ht 6' (1.829 m)   Wt 213 lb (96.6 kg)   SpO2 98%   BMI 28.89 kg/m  Constitutional:  Generally well appearing male in no acute distress. Psychiatric: Pleasant. Normal mood and affect. Behavior is normal. EENT: Pupils normal.  Conjunctivae are normal. No scleral icterus. Neck supple.  Cardiovascular: Normal rate, regular rhythm.  Pulmonary/chest: Effort normal and breath sounds normal. No wheezing, rales or rhonchi. Abdominal: Soft, nondistended, nontender. Bowel sounds active throughout. There are no masses palpable. No hepatomegaly. Neurological: Alert and oriented to person  place and time. Extremities: No edema Skin: Skin is warm and dry. No rashes noted.  Tye Savoy, NP  02/02/2022, 8:45 AM  Cc:  Referring Provider Tower,  Wynelle Fanny, MD

## 2022-02-05 NOTE — Progress Notes (Signed)
Agree with the assessment and plan as outlined by Paula Guenther, NP.  Ainslie Mazurek E. Bugbee, MD [facility]  

## 2022-02-08 ENCOUNTER — Encounter: Payer: Medicare PPO | Admitting: Gastroenterology

## 2022-02-17 DIAGNOSIS — U071 COVID-19: Secondary | ICD-10-CM | POA: Diagnosis not present

## 2022-03-20 ENCOUNTER — Encounter: Payer: Self-pay | Admitting: Gastroenterology

## 2022-03-27 ENCOUNTER — Encounter: Payer: Self-pay | Admitting: Gastroenterology

## 2022-03-27 ENCOUNTER — Ambulatory Visit (AMBULATORY_SURGERY_CENTER): Payer: Medicare PPO | Admitting: Gastroenterology

## 2022-03-27 VITALS — BP 143/76 | HR 80 | Temp 97.5°F | Resp 17 | Ht 72.0 in | Wt 213.0 lb

## 2022-03-27 DIAGNOSIS — D123 Benign neoplasm of transverse colon: Secondary | ICD-10-CM | POA: Diagnosis not present

## 2022-03-27 DIAGNOSIS — R195 Other fecal abnormalities: Secondary | ICD-10-CM

## 2022-03-27 DIAGNOSIS — Z1211 Encounter for screening for malignant neoplasm of colon: Secondary | ICD-10-CM

## 2022-03-27 DIAGNOSIS — D122 Benign neoplasm of ascending colon: Secondary | ICD-10-CM | POA: Diagnosis not present

## 2022-03-27 DIAGNOSIS — D128 Benign neoplasm of rectum: Secondary | ICD-10-CM | POA: Diagnosis not present

## 2022-03-27 DIAGNOSIS — D124 Benign neoplasm of descending colon: Secondary | ICD-10-CM | POA: Diagnosis not present

## 2022-03-27 DIAGNOSIS — D129 Benign neoplasm of anus and anal canal: Secondary | ICD-10-CM | POA: Diagnosis not present

## 2022-03-27 DIAGNOSIS — Z86010 Personal history of colonic polyps: Secondary | ICD-10-CM | POA: Diagnosis not present

## 2022-03-27 DIAGNOSIS — D120 Benign neoplasm of cecum: Secondary | ICD-10-CM

## 2022-03-27 DIAGNOSIS — K219 Gastro-esophageal reflux disease without esophagitis: Secondary | ICD-10-CM | POA: Diagnosis not present

## 2022-03-27 DIAGNOSIS — K635 Polyp of colon: Secondary | ICD-10-CM | POA: Diagnosis not present

## 2022-03-27 MED ORDER — SODIUM CHLORIDE 0.9 % IV SOLN: 500.0000 mL | Freq: Once | INTRAVENOUS | Status: DC

## 2022-03-27 NOTE — Progress Notes (Signed)
Called to room to assist during endoscopic procedure.  Patient ID and intended procedure confirmed with present staff. Received instructions for my participation in the procedure from the performing physician.  

## 2022-03-27 NOTE — Progress Notes (Signed)
[facility] History and Physical   Primary Care Physician:  Tower, Wynelle Fanny, MD   Reason for Procedure:   Positive Cologuard  Plan:    Colonoscopy     HPI: Darren [name] is a 77 y.o. male undergoing colonoscopy to evaluate positive Cologuard.  He has no family history of colon cancer and no chronic GI symptoms. His last colonoscopy was in 2017 and was notable for diverticulosis, otherwise normal.  He had a tubular adenoma in 2011.   Past Medical History:  Diagnosis Date   Basal cell carcinoma    face   Colon polyp 2023   GERD (gastroesophageal reflux disease)    History of colon polyps    Hypertrophy of prostate without urinary obstruction and other lower urinary tract symptoms (LUTS)    Leukocytopenia, unspecified    Other and unspecified hyperlipidemia    no meds   Overactive bladder     Past Surgical History:  Procedure Laterality Date   COLONOSCOPY     multiple - polyps   PROSTATE SURGERY  08/2012   TURP  at [place] Right 2003   UPPER GI ENDOSCOPY     WISDOM TOOTH EXTRACTION      Prior to Admission medications   Medication Sig Start Date End Date Taking? Authorizing Provider  ALPRAZolam Duanne Moron) 0.5 MG tablet Take 1 by mouth up to twice daily for airplane flight 09/19/16   Tower, Roque Lias A, MD  famotidine (PEPCID) 20 MG tablet Take 40 mg by mouth at bedtime.     [provider]  GEMTESA 75 MG TABS Take 1 tablet by mouth daily. 11/30/21   [provider]  Methylcellulose, Laxative, (CITRUCEL PO) Take 2 tablets by mouth daily.    [provider]  Multiple Vitamins-Minerals (CENTRUM SILVER 50+MEN PO) Take 1 Capful by mouth daily.    [provider]  omeprazole (PRILOSEC OTC) 20 MG tablet Take 20 mg by mouth every morning.     [provider]  tadalafil (CIALIS) 5 MG tablet Take 5 mg by mouth daily as needed for erectile dysfunction.    [provider]    Current Outpatient  Medications  Medication Sig Dispense Refill   ALPRAZolam (XANAX) 0.5 MG tablet Take 1 by mouth up to twice daily for airplane flight 15 tablet 0   famotidine (PEPCID) 20 MG tablet Take 40 mg by mouth at bedtime.      GEMTESA 75 MG TABS Take 1 tablet by mouth daily.     Methylcellulose, Laxative, (CITRUCEL PO) Take 2 tablets by mouth daily.     Multiple Vitamins-Minerals (CENTRUM SILVER 50+MEN PO) Take 1 Capful by mouth daily.     omeprazole (PRILOSEC OTC) 20 MG tablet Take 20 mg by mouth every morning.      tadalafil (CIALIS) 5 MG tablet Take 5 mg by mouth daily as needed for erectile dysfunction.     Current Facility-Administered Medications  Medication Dose Route Frequency Provider Last Rate Last Admin   0.9 %  sodium chloride infusion  500 mL Intravenous Once Daryel November, MD        Allergies as of 03/27/2022 - Review Complete 02/02/2022  Allergen Reaction Noted   Hydrocodone Nausea Only 08/26/2012   Penicillin g Other (See Comments) 10/04/2016   Penicillins  10/22/2006    Family History  Problem Relation Age of Onset   Heart attack Father    Breast cancer Mother    Thyroid cancer Maternal Grandmother  Colon cancer Neg Hx    Esophageal cancer Neg Hx    Rectal cancer Neg Hx    Stomach cancer Neg Hx     Social History   Socioeconomic History   Marital status: Married    Spouse name: Not on file   Number of children: 1   Years of education: Not on file   Highest education level: Not on file  Occupational History   Occupation: retired college professor  Tobacco Use   Smoking status: Former    Packs/day: 1.00    Years: 5.00    Total pack years: 5.00    Types: Cigarettes    Quit date: 01/24/1968    Years since quitting: 54.2   Smokeless tobacco: Never  Vaping Use   Vaping Use: Never used  Substance and Sexual Activity   Alcohol use: Yes    Alcohol/week: 14.0 - 21.0 standard drinks of alcohol    Types: 14 - 21 Glasses of wine per week    Comment: 2-3  glasses wine/day   Drug use: No   Sexual activity: Yes  Other Topics Concern   Not on file  Social History Narrative   Not on file   Social Determinants of Health   Financial Resource Strain: Low Risk  (11/10/2021)   Overall Financial Resource Strain (CARDIA)    Difficulty of Paying Living Expenses: Not hard at all  Food Insecurity: No Food Insecurity (11/10/2021)   Hunger Vital Sign    Worried About Running Out of Food in the Last Year: Never true    Ran Out of Food in the Last Year: Never true  Transportation Needs: No Transportation Needs (11/10/2021)   PRAPARE - Hydrologist (Medical): No    Lack of Transportation (Non-Medical): No  Physical Activity: Sufficiently Active (11/10/2021)   Exercise Vital Sign    Days of Exercise per Week: 5 days    Minutes of Exercise per Session: 40 min  Stress: No Stress Concern Present (11/10/2021)   [place]    Feeling of Stress : Not at all  Social Connections: Moderately Integrated (11/10/2021)   Social Connection and Isolation Panel [NHANES]    Frequency of Communication with Friends and Family: Three times a week    Frequency of Social Gatherings with Friends and Family: Twice a week    Attends Religious Services: More than 4 times per year    Active Member of [employer] or Organizations: No    Attends Archivist Meetings: Never    Marital Status: Married  Human resources officer Violence: Not At Risk (10/10/2019)   Humiliation, Afraid, Rape, and Kick questionnaire    Fear of Current or Ex-Partner: No    Emotionally Abused: No    Physically Abused: No    Sexually Abused: No    Review of Systems:  All other review of systems negative except as mentioned in the HPI.  Physical Exam: Vital signs BP (!) 140/87   Pulse 75   Temp (!) 97.5 F (36.4 C) (Skin)   Ht 6' (1.829 m)   Wt 213 lb (96.6 kg)   SpO2 99%   BMI 28.89 kg/m    General:   Alert,  Well-developed, well-nourished, pleasant and cooperative in NAD Airway:  Mallampati 2 Lungs:  Clear throughout to auscultation.   Heart:  Regular rate and rhythm; no murmurs, clicks, rubs,  or gallops. Abdomen:  Soft, nontender and nondistended. Normal bowel sounds.  Neuro/Psych:  Normal mood and affect. A and O x 3   Averey Koning E. Candis Schatz, MD [facility]

## 2022-03-27 NOTE — Patient Instructions (Signed)
Thank you for coming in to see [place] today! Resume previous diet and medications today. Return to regular activities tomorrow. Await pathology results, approximately 2 weeks.  Will make recommendation at that time for future colonoscopy.    YOU HAD AN ENDOSCOPIC PROCEDURE TODAY AT [facility]:   Refer to the procedure report that was given to you for any specific questions about what was found during the examination.  If the procedure report does not answer your questions, please call your gastroenterologist to clarify.  If you requested that your care partner not be given the details of your procedure findings, then the procedure report has been included in a sealed envelope for you to review at your convenience later.  YOU SHOULD EXPECT: Some feelings of bloating in the abdomen. Passage of more gas than usual.  Walking can help get rid of the air that was put into your GI tract during the procedure and reduce the bloating. If you had a lower endoscopy (such as a colonoscopy or flexible sigmoidoscopy) you may notice spotting of blood in your stool or on the toilet paper. If you underwent a bowel prep for your procedure, you may not have a normal bowel movement for a few days.  Please Note:  You might notice some irritation and congestion in your nose or some drainage.  This is from the oxygen used during your procedure.  There is no need for concern and it should clear up in a day or so.  SYMPTOMS TO REPORT IMMEDIATELY:  Following lower endoscopy (colonoscopy or flexible sigmoidoscopy):  Excessive amounts of blood in the stool  Significant tenderness or worsening of abdominal pains  Swelling of the abdomen that is new, acute  Fever of 100F or higher    For urgent or emergent issues, a gastroenterologist can be reached at any hour by calling [phone]. Do not use MyChart messaging for urgent concerns.    DIET:  We do recommend a small meal at first, but then you may  proceed to your regular diet.  Drink plenty of fluids but you should avoid alcoholic beverages for 24 hours.  ACTIVITY:  You should plan to take it easy for the rest of today and you should NOT DRIVE or use heavy machinery until tomorrow (because of the sedation medicines used during the test).    FOLLOW UP: Our staff will call the number listed on your records the next business day following your procedure.  We will call around 7:15- 8:00 am to check on you and address any questions or concerns that you may have regarding the information given to you following your procedure. If we do not reach you, we will leave a message.     If any biopsies were taken you will be contacted by phone or by letter within the next 1-3 weeks.  Please call us at [phone] if you have not heard about the biopsies in 3 weeks.    SIGNATURES/CONFIDENTIALITY: You and/or your care partner have signed paperwork which will be entered into your electronic medical record.  These signatures attest to the fact that that the information above on your After Visit Summary has been reviewed and is understood.  Full responsibility of the confidentiality of this discharge information lies with you and/or your care-partner.

## 2022-03-27 NOTE — Op Note (Signed)
[facility] Patient Name: Darren [name] Procedure Date: 03/27/2022 8:26 AM MRN: [id number] Endoscopist: Nicki Reaper E. Candis Schatz , MD, [id number] Age: 77 Referring MD:  Date of Birth: 10/11/1945 Gender: Male Account #: [id number] Procedure:                Colonoscopy Indications:              Positive Cologuard test Medicines:                Monitored Anesthesia Care Procedure:                Pre-Anesthesia Assessment:                           - Prior to the procedure, a History and Physical                            was performed, and patient medications and                            allergies were reviewed. The patient's tolerance of                            previous anesthesia was also reviewed. The risks                            and benefits of the procedure and the sedation                            options and risks were discussed with the patient.                            All questions were answered, and informed consent                            was obtained. Prior Anticoagulants: The patient has                            taken no anticoagulant or antiplatelet agents. ASA                            Grade Assessment: II - A patient with mild systemic                            disease. After reviewing the risks and benefits,                            the patient was deemed in satisfactory condition to                            undergo the procedure.                           After obtaining informed consent, the colonoscope  was passed under direct vision. Throughout the                            procedure, the patient's blood pressure, pulse, and                            oxygen saturations were monitored continuously. The                            Olympus [id number] SN [id number] was introduced through                            the anus and advanced to the the cecum, identified                            by appendiceal orifice  and ileocecal valve. The                            colonoscopy was somewhat difficult due to a                            redundant colon and significant looping. Successful                            completion of the procedure was aided by using                            manual pressure and withdrawing and reinserting the                            scope. The patient tolerated the procedure well.                            The quality of the bowel preparation was adequate.                            The ileocecal valve, appendiceal orifice, and                            rectum were photographed. The bowel preparation                            used was SUPREP via split dose instruction. Scope In: 8:42:58 AM Scope Out: 9:13:43 AM Scope Withdrawal Time: 0 hours 21 minutes 16 seconds  Total Procedure Duration: 0 hours 30 minutes 45 seconds  Findings:                 The perianal and digital rectal examinations were                            normal. Pertinent negatives include normal                            sphincter tone and no palpable  rectal lesions.                           A 10 mm polyp was found in the cecum. The polyp was                            sessile. The polyp was removed with a cold snare.                            Resection and retrieval were complete. Estimated                            blood loss was minimal.                           Two sessile polyps were found in the ascending                            colon. The polyps were 3 to 6 mm in size. These                            polyps were removed with a cold snare. Resection                            and retrieval were complete. Estimated blood loss                            was minimal.                           Two flat and sessile polyps were found in the                            splenic flexure. The polyps were 4 to 5 mm in size.                            These polyps were removed with a cold snare.                             Resection and retrieval were complete. Estimated                            blood loss was minimal.                           A 2 mm polyp was found in the descending colon. The                            polyp was sessile. The polyp was removed with a                            cold snare. Resection and retrieval were complete.  Estimated blood loss was minimal.                           A 2 mm polyp was found in the rectum. The polyp was                            sessile. The polyp was removed with a cold snare.                            Resection and retrieval were complete. Estimated                            blood loss was minimal.                           Multiple large-mouthed and small-mouthed                            diverticula were found in the sigmoid colon,                            descending colon and ascending colon. There was no                            evidence of diverticular bleeding.                           The exam was otherwise normal throughout the                            examined colon.                           Non-bleeding internal hemorrhoids were found during                            retroflexion. The hemorrhoids were Grade I                            (internal hemorrhoids that do not prolapse).                           No additional abnormalities were found on                            retroflexion. Complications:            No immediate complications. Estimated Blood Loss:     Estimated blood loss was minimal. Impression:               - One 10 mm polyp in the cecum, removed with a cold                            snare. Resected and retrieved.                           -  Two 3 to 6 mm polyps in the ascending colon,                            removed with a cold snare. Resected and retrieved.                           - Two 4 to 5 mm polyps at the splenic flexure,                             removed with a cold snare. Resected and retrieved.                           - One 2 mm polyp in the descending colon, removed                            with a cold snare. Resected and retrieved.                           - One 2 mm polyp in the rectum, removed with a cold                            snare. Resected and retrieved.                           - Moderate diverticulosis in the sigmoid colon, in                            the descending colon and in the ascending colon.                            There was no evidence of diverticular bleeding.                           - Non-bleeding internal hemorrhoids. Recommendation:           - Patient has a contact number available for                            emergencies. The signs and symptoms of potential                            delayed complications were discussed with the                            patient. Return to normal activities tomorrow.                            Written discharge instructions were provided to the                            patient.                           - Resume previous  diet.                           - Continue present medications.                           - Await pathology results.                           - Repeat colonoscopy (date not yet determined) for                            surveillance based on pathology results. Antaeus Karel E. Candis Schatz, MD 03/27/2022 9:21:31 AM This report has been signed electronically.

## 2022-03-28 ENCOUNTER — Telehealth: Payer: Self-pay

## 2022-03-28 NOTE — Telephone Encounter (Signed)
  Follow up Call-     03/27/2022    7:51 AM  Call back number  Post procedure Call Back phone  # [phone]  Permission to leave phone message Yes     Patient questions:  Do you have a fever, pain , or abdominal swelling? No. Pain Score  0 *  Have you tolerated food without any problems? Yes.    Have you been able to return to your normal activities? Yes.    Do you have any questions about your discharge instructions: Diet   No. Medications  No. Follow up visit  No.  Do you have questions or concerns about your Care? No.  Actions: * If pain score is 4 or above: No action needed, pain <4.

## 2022-04-02 NOTE — Progress Notes (Signed)
Darren [name],  All of the polyps that I removed during your recent procedure were completely benign but were proven to be "pre-cancerous" polyps that MAY have grown into cancers if they had not been removed.  Studies shows that at least 20% of women over age 77 and 30% of men over age 2 have pre-cancerous polyps.  Based on current nationally recognized surveillance guidelines, I recommend that you have a repeat colonoscopy in 3 years.   If you develop any new rectal bleeding, abdominal pain or significant bowel habit changes, please contact me before then.

## 2022-04-03 ENCOUNTER — Telehealth: Payer: Self-pay | Admitting: Gastroenterology

## 2022-04-03 NOTE — Telephone Encounter (Signed)
I have spoken with the pt and he states that after his colon last week he has felt light headed.  He has no other symptoms and has been pushing fluids.  He will call his PCP for evaluation and let us know if he needs [place] in the meantime.

## 2022-04-03 NOTE — Telephone Encounter (Signed)
Inbound call from patient, states after his procedure he has been feeling extremely light headed. Patient states he is unsure if this is related to his procedure but would like to speak with a nurse to further advise.

## 2022-04-10 ENCOUNTER — Encounter: Payer: Self-pay | Admitting: Family Medicine

## 2022-04-10 ENCOUNTER — Ambulatory Visit: Payer: Medicare PPO | Admitting: Family Medicine

## 2022-04-10 VITALS — BP 126/82 | HR 62 | Temp 97.6°F | Ht 72.0 in | Wt 209.2 lb

## 2022-04-10 DIAGNOSIS — R42 Dizziness and giddiness: Secondary | ICD-10-CM | POA: Diagnosis not present

## 2022-04-10 DIAGNOSIS — Z860100 Personal history of colon polyps, unspecified: Secondary | ICD-10-CM

## 2022-04-10 DIAGNOSIS — H6121 Impacted cerumen, right ear: Secondary | ICD-10-CM

## 2022-04-10 LAB — CBC WITH DIFFERENTIAL/PLATELET
Basophils Absolute: 0.1 10*3/uL (ref 0.0–0.1)
Basophils Relative: 1.2 % (ref 0.0–3.0)
Eosinophils Absolute: 0.1 10*3/uL (ref 0.0–0.7)
Eosinophils Relative: 1.5 % (ref 0.0–5.0)
HCT: 41.4 % (ref 39.0–52.0)
Hemoglobin: 14 g/dL (ref 13.0–17.0)
Lymphocytes Relative: 24.8 % (ref 12.0–46.0)
Lymphs Abs: 1.4 10*3/uL (ref 0.7–4.0)
MCHC: 33.9 g/dL (ref 30.0–36.0)
MCV: 99.7 fl (ref 78.0–100.0)
Monocytes Absolute: 0.6 10*3/uL (ref 0.1–1.0)
Monocytes Relative: 10.8 % (ref 3.0–12.0)
Neutro Abs: 3.6 10*3/uL (ref 1.4–7.7)
Neutrophils Relative %: 61.7 % (ref 43.0–77.0)
Platelets: 159 10*3/uL (ref 150.0–400.0)
RBC: 4.15 Mil/uL — ABNORMAL LOW (ref 4.22–5.81)
RDW: 12.5 % (ref 11.5–15.5)
WBC: 5.8 10*3/uL (ref 4.0–10.5)

## 2022-04-10 LAB — BASIC METABOLIC PANEL
BUN: 14 mg/dL (ref 6–23)
CO2: 28 mEq/L (ref 19–32)
Calcium: 9.7 mg/dL (ref 8.4–10.5)
Chloride: 102 mEq/L (ref 96–112)
Creatinine, Ser: 0.97 mg/dL (ref 0.40–1.50)
GFR: 75.49 mL/min (ref >60.00)
Glucose, Bld: 98 mg/dL (ref 70–99)
Potassium: 4.2 mEq/L (ref 3.5–5.1)
Sodium: 137 mEq/L (ref 135–145)

## 2022-04-10 NOTE — Assessment & Plan Note (Signed)
Since just after colonoscopy 03/27/2022 and now getting better  Suspect he may have been dehydrated/ improved now Mostly light headed Did have one episode of spinning feeling when lying on back fixing a table Feels better today Reassuring exam  Orthostatic bp and pulse did not go down  Labs ordered/lytes and cbc  Inst to follow up if symptoms do not continue to improve  Enc to change position slowly  Disc hydration - needs more water esp when drinking coffee- see AVS  Update if not starting to improve in a week or if worsening

## 2022-04-10 NOTE — Assessment & Plan Note (Signed)
Partial  Inst to use solution / like debrox If this does not clear on own we can flush

## 2022-04-10 NOTE — Progress Notes (Signed)
Subjective:    Patient ID: Darren [name], male    DOB: 04-Jan-1946, 77 y.o.   MRN: [id number]  HPI Pt presents for c/o light headedness since he had his colonoscopy   Wt Readings from Last 3 Encounters:  04/10/22 209 lb 4 oz (94.9 kg)  03/27/22 213 lb (96.6 kg)  02/02/22 213 lb (96.6 kg)   28.38 kg/m  Vitals:   04/10/22 1053  BP: 126/82  Pulse: 62  Temp: 97.6 F (36.4 C)  SpO2: 100%    Had some polyps on colonoscopy 03/27/22  Recommended 3 y recall/ adenomas   Prep not worse than a usual prep Did pretty well he thinks  No n/v with that    Felt light headed 2-3 days after the colonoscopy  Started when he got up out of bed (had to sit back down)   At the 1 week mark he had a bad episode when he was trying to fix a table Got a spinning sensation (different from light headed ness  Made sure to take his time getting up     He called the GI and they recommend he sees [place]  Was told to push fluids   [place]  Did for a few days   Now some better  Very mild  Not worried about falling down No spinning       BP Readings from Last 3 Encounters:  04/10/22 126/82  03/27/22 (!) 143/76  02/02/22 130/88   Pulse Readings from Last 3 Encounters:  04/10/22 62  03/27/22 80  02/02/22 72   Used a home bp cuff - 120s/70s for the most part   Lab Results  Component Value Date   CREATININE 0.94 10/28/2021   BUN 18 10/28/2021   NA 138 10/28/2021   K 4.3 10/28/2021   CL 103 10/28/2021   CO2 29 10/28/2021   Lab Results  Component Value Date   ALT 17 10/28/2021   AST 16 10/28/2021   ALKPHOS 40 10/28/2021   BILITOT 0.7 10/28/2021   Lab Results  Component Value Date   WBC 4.3 10/28/2021   HGB 13.7 10/28/2021   HCT 40.6 10/28/2021   MCV 99.5 10/28/2021   PLT 166.0 10/28/2021    Patient Active Problem List   Diagnosis Date Noted   Light headedness 04/10/2022   Impacted cerumen, right ear  04/10/2022   Positive colorectal cancer screening using Cologuard test 11/21/2021   Colon cancer screening 11/01/2021   Right hip pain 11/01/2021   Current use of proton pump inhibitor 10/28/2020   Left knee pain 05/05/2020   Exposure to communicable disease 08/20/2018   Elevated glucose level 09/19/2016   Nasal congestion 09/19/2016   Encounter for Medicare annual wellness exam 08/18/2014   Routine general medical examination at a health care facility 08/10/2014   Personal history of colonic polyps 09/21/2009   Hyperlipidemia 12/02/2008   GERD 12/02/2008   ANXIETY DISORDER, SITUATIONAL, MILD 04/03/2008   BPH (benign prostatic hyperplasia) 10/22/2006   Past Medical History:  Diagnosis Date   Basal cell carcinoma    face   Colon polyp 2023   GERD (gastroesophageal reflux disease)    History of colon polyps    Hypertrophy of prostate without urinary obstruction and other lower urinary tract symptoms (LUTS)    Leukocytopenia, unspecified    Other and unspecified hyperlipidemia    no meds   Overactive bladder    Past  Surgical History:  Procedure Laterality Date   COLONOSCOPY     multiple - polyps   PROSTATE SURGERY  08/2012   TURP  at [place] Right 2003   UPPER GI ENDOSCOPY     WISDOM TOOTH EXTRACTION     Social History   Tobacco Use   Smoking status: Former    Packs/day: 1.00    Years: 5.00    Additional pack years: 0.00    Total pack years: 5.00    Types: Cigarettes    Quit date: 01/24/1968    Years since quitting: 54.2   Smokeless tobacco: Never  Vaping Use   Vaping Use: Never used  Substance Use Topics   Alcohol use: Yes    Alcohol/week: 14.0 - 21.0 standard drinks of alcohol    Types: 14 - 21 Glasses of wine per week    Comment: 2-3 glasses wine/day   Drug use: No   Family History  Problem Relation Age of Onset   Heart attack Father    Breast cancer Mother    Thyroid cancer Maternal Grandmother    Colon cancer Neg Hx    Esophageal  cancer Neg Hx    Rectal cancer Neg Hx    Stomach cancer Neg Hx    Allergies  Allergen Reactions   Hydrocodone Nausea Only    Hydrocodone caused terrible nausea.   Penicillins     REACTION: allergy as a child   Current Outpatient Medications on File Prior to Visit  Medication Sig Dispense Refill   ALPRAZolam (XANAX) 0.5 MG tablet Take 1 by mouth up to twice daily for airplane flight 15 tablet 0   famotidine (PEPCID) 20 MG tablet Take 40 mg by mouth at bedtime.      GEMTESA 75 MG TABS Take 1 tablet by mouth daily.     Multiple Vitamins-Minerals (CENTRUM SILVER 50+MEN PO) Take 1 Capful by mouth daily.     omeprazole (PRILOSEC OTC) 20 MG tablet Take 20 mg by mouth every morning.      tadalafil (CIALIS) 5 MG tablet Take 5 mg by mouth daily as needed for erectile dysfunction.     No current facility-administered medications on file prior to visit.     Review of Systems  Constitutional:  Negative for activity change, appetite change, fatigue, fever and unexpected weight change.  HENT:  Negative for congestion, rhinorrhea, sore throat and trouble swallowing.   Eyes:  Negative for pain, redness, itching and visual disturbance.  Respiratory:  Negative for cough, chest tightness, shortness of breath and wheezing.   Cardiovascular:  Negative for chest pain and palpitations.  Gastrointestinal:  Negative for abdominal pain, blood in stool, constipation, diarrhea and nausea.  Endocrine: Negative for cold intolerance, heat intolerance, polydipsia and polyuria.  Genitourinary:  Negative for difficulty urinating, dysuria, frequency and urgency.  Musculoskeletal:  Negative for arthralgias, joint swelling and myalgias.  Skin:  Negative for pallor and rash.  Neurological:  Positive for light-headedness. Negative for dizziness, tremors, seizures, syncope, facial asymmetry, speech difficulty, weakness, numbness and headaches.  Hematological:  Negative for adenopathy. Does not bruise/bleed easily.   Psychiatric/Behavioral:  Negative for decreased concentration and dysphoric mood. The patient is not nervous/anxious.        Objective:   Physical Exam Constitutional:      General: He is not in acute distress.    Appearance: Normal appearance. He is well-developed and normal weight. He is not ill-appearing or diaphoretic.  HENT:  Head: Normocephalic and atraumatic.     Right Ear: There is impacted cerumen.     Left Ear: Tympanic membrane and ear canal normal. There is no impacted cerumen.     Ears:     Comments: Partial cerumen imp of R ear    Mouth/Throat:     Mouth: Mucous membranes are moist.  Eyes:     General: No scleral icterus.    Conjunctiva/sclera: Conjunctivae normal.     Pupils: Pupils are equal, round, and reactive to light.     Comments: No nystagmus  No reprod of dizziness with lateral gaze  Neck:     Thyroid: No thyromegaly.     Vascular: No carotid bruit or JVD.  Cardiovascular:     Rate and Rhythm: Normal rate and regular rhythm.     Heart sounds: Normal heart sounds.     No gallop.  Pulmonary:     Effort: Pulmonary effort is normal. No respiratory distress.     Breath sounds: Normal breath sounds. No wheezing or rales.  Abdominal:     General: There is no distension or abdominal bruit.     Palpations: Abdomen is soft.  Musculoskeletal:     Cervical back: Normal range of motion and neck supple.     Right lower leg: No edema.     Left lower leg: No edema.  Lymphadenopathy:     Cervical: No cervical adenopathy.  Skin:    General: Skin is warm and dry.     Coloration: Skin is not pale.     Findings: No bruising or rash.  Neurological:     Mental Status: He is alert.     Cranial Nerves: No cranial nerve deficit.     Sensory: No sensory deficit.     Motor: No weakness.     Coordination: Coordination normal.     Gait: Gait normal.     Deep Tendon Reflexes: Reflexes are normal and symmetric. Reflexes normal.  Psychiatric:        Mood and Affect:  Mood normal.           Assessment & Plan:   Problem List Items Addressed This Visit       Nervous and Auditory   Impacted cerumen, right ear    Partial  Inst to use solution / like debrox If this does not clear on own we can flush           Other   Light headedness - Primary    Since just after colonoscopy 03/27/2022 and now getting better  Suspect he may have been dehydrated/ improved now Mostly light headed Did have one episode of spinning feeling when lying on back fixing a table Feels better today Reassuring exam  Orthostatic bp and pulse did not go down  Labs ordered/lytes and cbc  Inst to follow up if symptoms do not continue to improve  Enc to change position slowly  Disc hydration - needs more water esp when drinking coffee- see AVS  Update if not starting to improve in a week or if worsening        Relevant Orders   CBC with Differential/Platelet   Basic metabolic panel   Personal history of colonic polyps    Several polyps found with colonoscopy 03/2022 after pos cologuard test   Recall is 3 y for adenomas if he is healthy enough to do test  Report and GI notes reviewed today

## 2022-04-10 NOTE — Patient Instructions (Signed)
Aim for more fluids (especially water)   If you drink coffee - make sure to get a glass of water for every cup of coffee   Lab today - lytes and blood count   If the light headedness/dizziness does not continue to improve let us know

## 2022-04-10 NOTE — Assessment & Plan Note (Signed)
Several polyps found with colonoscopy 03/2022 after pos cologuard test   Recall is 3 y for adenomas if he is healthy enough to do test  Report and GI notes reviewed today

## 2022-05-12 DIAGNOSIS — N5201 Erectile dysfunction due to arterial insufficiency: Secondary | ICD-10-CM | POA: Diagnosis not present

## 2022-05-12 DIAGNOSIS — N401 Enlarged prostate with lower urinary tract symptoms: Secondary | ICD-10-CM | POA: Diagnosis not present

## 2022-05-12 DIAGNOSIS — R350 Frequency of micturition: Secondary | ICD-10-CM | POA: Diagnosis not present

## 2022-05-30 DIAGNOSIS — M950 Acquired deformity of nose: Secondary | ICD-10-CM | POA: Diagnosis not present

## 2022-10-10 DIAGNOSIS — Z961 Presence of intraocular lens: Secondary | ICD-10-CM | POA: Diagnosis not present

## 2022-10-29 ENCOUNTER — Telehealth: Payer: Self-pay | Admitting: Family Medicine

## 2022-10-29 DIAGNOSIS — E7800 Pure hypercholesterolemia, unspecified: Secondary | ICD-10-CM

## 2022-10-29 DIAGNOSIS — R7309 Other abnormal glucose: Secondary | ICD-10-CM

## 2022-10-29 DIAGNOSIS — Z79899 Other long term (current) drug therapy: Secondary | ICD-10-CM

## 2022-10-29 DIAGNOSIS — N400 Enlarged prostate without lower urinary tract symptoms: Secondary | ICD-10-CM

## 2022-10-29 NOTE — Telephone Encounter (Signed)
-----   Message from Alvina Chou sent at 10/13/2022  9:09 AM EDT ----- Regarding: Lab orders for Mon, 10.7.24 Patient is scheduled for CPX labs, please order future labs, Thanks , Camelia Eng

## 2022-10-30 ENCOUNTER — Other Ambulatory Visit (INDEPENDENT_AMBULATORY_CARE_PROVIDER_SITE_OTHER): Payer: Medicare PPO

## 2022-10-30 DIAGNOSIS — R7309 Other abnormal glucose: Secondary | ICD-10-CM | POA: Diagnosis not present

## 2022-10-30 DIAGNOSIS — E7800 Pure hypercholesterolemia, unspecified: Secondary | ICD-10-CM | POA: Diagnosis not present

## 2022-10-30 DIAGNOSIS — Z79899 Other long term (current) drug therapy: Secondary | ICD-10-CM

## 2022-10-30 DIAGNOSIS — N400 Enlarged prostate without lower urinary tract symptoms: Secondary | ICD-10-CM

## 2022-10-30 LAB — COMPREHENSIVE METABOLIC PANEL WITH GFR
ALT: 14 U/L (ref 0–53)
AST: 16 U/L (ref 0–37)
Albumin: 3.8 g/dL (ref 3.5–5.2)
Alkaline Phosphatase: 45 U/L (ref 39–117)
BUN: 15 mg/dL (ref 6–23)
CO2: 28 meq/L (ref 19–32)
Calcium: 8.8 mg/dL (ref 8.4–10.5)
Chloride: 103 meq/L (ref 96–112)
Creatinine, Ser: 0.87 mg/dL (ref 0.40–1.50)
GFR: 83.11 mL/min (ref >60.00)
Glucose, Bld: 104 mg/dL — ABNORMAL HIGH (ref 70–99)
Potassium: 4.2 meq/L (ref 3.5–5.1)
Sodium: 139 meq/L (ref 135–145)
Total Bilirubin: 0.5 mg/dL (ref 0.2–1.2)
Total Protein: 6 g/dL (ref 6.0–8.3)

## 2022-10-30 LAB — CBC WITH DIFFERENTIAL/PLATELET
Basophils Absolute: 0 10*3/uL (ref 0.0–0.1)
Basophils Relative: 0.6 % (ref 0.0–3.0)
Eosinophils Absolute: 0.3 10*3/uL (ref 0.0–0.7)
Eosinophils Relative: 4.3 % (ref 0.0–5.0)
HCT: 41.4 % (ref 39.0–52.0)
Hemoglobin: 13.9 g/dL (ref 13.0–17.0)
Lymphocytes Relative: 22.5 % (ref 12.0–46.0)
Lymphs Abs: 1.3 10*3/uL (ref 0.7–4.0)
MCHC: 33.6 g/dL (ref 30.0–36.0)
MCV: 99.4 fL (ref 78.0–100.0)
Monocytes Absolute: 0.7 10*3/uL (ref 0.1–1.0)
Monocytes Relative: 12.1 % — ABNORMAL HIGH (ref 3.0–12.0)
Neutro Abs: 3.6 10*3/uL (ref 1.4–7.7)
Neutrophils Relative %: 60.5 % (ref 43.0–77.0)
Platelets: 178 10*3/uL (ref 150.0–400.0)
RBC: 4.16 Mil/uL — ABNORMAL LOW (ref 4.22–5.81)
RDW: 12.7 % (ref 11.5–15.5)
WBC: 6 10*3/uL (ref 4.0–10.5)

## 2022-10-30 LAB — VITAMIN B12: Vitamin B-12: 775 pg/mL (ref 211–911)

## 2022-10-30 LAB — LIPID PANEL
Cholesterol: 183 mg/dL (ref 0–200)
HDL: 59.7 mg/dL (ref >39.00)
LDL Cholesterol: 109 mg/dL — ABNORMAL HIGH (ref 0–99)
NonHDL: 123
Total CHOL/HDL Ratio: 3
Triglycerides: 72 mg/dL (ref 0.0–149.0)
VLDL: 14.4 mg/dL (ref 0.0–40.0)

## 2022-10-30 LAB — PSA, MEDICARE: PSA: 1.24 ng/mL (ref 0.10–4.00)

## 2022-10-30 LAB — HEMOGLOBIN A1C: Hgb A1c MFr Bld: 5.4 % (ref 4.6–6.5)

## 2022-10-30 LAB — TSH: TSH: 2.94 u[IU]/mL (ref 0.35–5.50)

## 2022-11-05 NOTE — Progress Notes (Unsigned)
Subjective:    Patient ID: Darren [name], male    DOB: 19-Feb-1945, 77 y.o.   MRN: [id number]  HPI  Here for health maintenance exam and to review chronic medical problems   Wt Readings from Last 3 Encounters:  11/06/22 208 lb (94.3 kg)  04/10/22 209 lb 4 oz (94.9 kg)  03/27/22 213 lb (96.6 kg)   28.89 kg/m  Vitals:   11/06/22 1026  BP: 124/68  Pulse: 70  Temp: 97.9 F (36.6 C)  SpO2: 97%    Immunization History  Administered Date(s) Administered   Fluad Quad(high Dose 65+) 10/01/2018, 10/14/2019   H1N1 02/06/2008   Hepatitis A 11/26/2003, 06/24/2004   Influenza Split 10/26/2010, 10/23/2011   Influenza Whole 12/15/2003, 11/16/2006, 10/27/2008, 10/14/2009   Influenza, High Dose Seasonal PF 09/24/2018, 10/28/2021, 10/18/2022   Influenza,inj,Quad PF,6+ Mos 10/09/2012, 10/29/2013, 10/13/2014, 10/29/2015, 11/10/2016, 10/05/2017   MODERNA COVID-19 SARS-COV-2 PEDS BIVALENT BOOSTER 69yr-35yr 05/04/2020, 09/29/2020   Moderna Covid-19 Vaccine Bivalent Booster 26yrs & up 06/24/2021   PFIZER(Purple Top)SARS-COV-2 Vaccination 02/27/2019, 03/24/2019   Pfizer Covid-19 Vaccine Bivalent Booster 25yrs & up 10/17/2021   Pneumococcal Conjugate-13 09/17/2015   Pneumococcal Polysaccharide-23 09/09/2007, 05/13/2013   Respiratory Syncytial Virus Vaccine,Recomb Aduvanted(Arexvy) 09/27/2021   Td 07/29/1998, 12/02/2008   Zoster Recombinant(Shingrix) 11/27/2017, 02/11/2018   Zoster, Live 09/28/2005    Health Maintenance Due  Topic Date Due   DTaP/Tdap/Td (3 - Tdap) 12/03/2018   COVID-19 Vaccine (7 - 2023-24 season) 09/24/2022   Medicare Annual Wellness (AWV)  11/11/2022    Flu shot-had this season  Tetanus shot - will get updated at pharmacy     Prostate health Lab Results  Component Value Date   PSA 1.24 10/30/2022   PSA 0.55 10/28/2021   PSA 0.52 10/28/2020  Under urology care for BPH -visit in April TURP in the past  On gemtesa  Also treatment for ED    Colon  cancer screening -positive cologuard 03/2022 and then colonoscopy with polyps 3 y recall if well enough for it    Bone health   Falls- none  Fractures-none  Supplements  mvi centrum silver  Exercise :  Exercises regularly  Likes to walk  Some stairs  Goes to the gym -some weights     Mood    11/06/2022   10:34 AM 04/10/2022   11:02 AM 11/10/2021    1:16 PM 11/10/2021    1:09 PM 11/01/2021    9:53 AM  Depression screen PHQ 2/9  Decreased Interest 0 0 0 0 0  Down, Depressed, Hopeless 0 0 0 0 0  PHQ - 2 Score 0 0 0 0 0  Altered sleeping 0 0 0 0 0  Tired, decreased energy 0 0 0 0 0  Change in appetite 0 0 0 0 0  Feeling bad or failure about yourself  0 0 0 0 0  Trouble concentrating 0 0 0 0 0  Moving slowly or fidgety/restless 0 0 0 0 0  Suicidal thoughts 0 0 0 0 0  PHQ-9 Score 0 0 0 0 0  Difficult doing work/chores Not difficult at all Not difficult at all Not difficult at all Not difficult at all Not difficult at all   Uses occational xanax for travel anxiety Doing well     GERD Omeprazole 20 mg daily  Lab Results  Component Value Date   VITAMINB12 775 10/30/2022  Takes extra B12   Glucose Lab Results  Component Value Date   HGBA1C 5.4 10/30/2022  Diet is pretty good  Keeps soda out of house   Whole grain breads     Hyperlipidemia Lab Results  Component Value Date   CHOL 183 10/30/2022   CHOL 190 10/28/2021   CHOL 194 10/28/2020   Lab Results  Component Value Date   HDL 59.70 10/30/2022   HDL 58.70 10/28/2021   HDL 66.40 10/28/2020   Lab Results  Component Value Date   LDLCALC 109 (H) 10/30/2022   LDLCALC 117 (H) 10/28/2021   LDLCALC 109 (H) 10/28/2020   Lab Results  Component Value Date   TRIG 72.0 10/30/2022   TRIG 72.0 10/28/2021   TRIG 89.0 10/28/2020   Lab Results  Component Value Date   CHOLHDL 3 10/30/2022   CHOLHDL 3 10/28/2021   CHOLHDL 3 10/28/2020   Lab Results  Component Value Date   LDLDIRECT 127.7 02/15/2009    LDLDIRECT 144.2 12/02/2008   LDLDIRECT 134.0 03/21/2007  Diet is good Avoids fast food   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    Lab Results  Component Value Date   NA 139 10/30/2022   K 4.2 10/30/2022   CO2 28 10/30/2022   GLUCOSE 104 (H) 10/30/2022   BUN 15 10/30/2022   CREATININE 0.87 10/30/2022   CALCIUM 8.8 10/30/2022   GFR 83.11 10/30/2022   GFRNONAA 80.01 12/02/2008   Lab Results  Component Value Date   WBC 6.0 10/30/2022   HGB 13.9 10/30/2022   HCT 41.4 10/30/2022   MCV 99.4 10/30/2022   PLT 178.0 10/30/2022   Lab Results  Component Value Date   TSH 2.94 10/30/2022     Patient Active Problem List   Diagnosis Date Noted   Positive colorectal cancer screening using Cologuard test 11/21/2021   Colon cancer screening 11/01/2021   Current use of proton pump inhibitor 10/28/2020   Elevated glucose level 09/19/2016   Nasal congestion 09/19/2016   Encounter for Medicare annual wellness exam 08/18/2014   Routine general medical examination at a health care facility 08/10/2014   History of colonic polyps 09/21/2009   Hyperlipidemia 12/02/2008   GERD 12/02/2008   ANXIETY DISORDER, SITUATIONAL, MILD 04/03/2008   BPH (benign prostatic hyperplasia) 10/22/2006   Past Medical History:  Diagnosis Date   Basal cell carcinoma    face   Colon polyp 2023   GERD (gastroesophageal reflux disease)    History of colon polyps    Hypertrophy of prostate without urinary obstruction and other lower urinary tract symptoms (LUTS)    Leukocytopenia, unspecified    Other and unspecified hyperlipidemia    no meds   Overactive bladder    Past Surgical History:  Procedure Laterality Date   COLONOSCOPY     multiple - polyps   PROSTATE SURGERY  08/2012   TURP  at [facility]   ROTATOR CUFF REPAIR Right 2003   UPPER GI ENDOSCOPY     WISDOM TOOTH EXTRACTION     Social History   Tobacco Use   Smoking status: Former    Current packs/day: 0.00    Average packs/day: 1 pack/day  for 5.0 years (5.0 ttl pk-yrs)    Types: Cigarettes    Start date: 01/24/1963    Quit date: 01/24/1968    Years since quitting: 54.8   Smokeless tobacco: Never  Vaping Use   Vaping status: Never Used  Substance Use Topics   Alcohol use: Yes    Alcohol/week: 14.0 - 21.0 standard drinks of alcohol    Types: 14 - 21  Glasses of wine per week    Comment: 2-3 glasses wine/day   Drug use: No   Family History  Problem Relation Age of Onset   Heart attack Father    Breast cancer Mother    Thyroid cancer Maternal Grandmother    Colon cancer Neg Hx    Esophageal cancer Neg Hx    Rectal cancer Neg Hx    Stomach cancer Neg Hx    Allergies  Allergen Reactions   Hydrocodone Nausea Only    Hydrocodone caused terrible nausea.   Penicillins     REACTION: allergy as a child   Current Outpatient Medications on File Prior to Visit  Medication Sig Dispense Refill   ALPRAZolam (XANAX) 0.5 MG tablet Take 1 by mouth up to twice daily for airplane flight 15 tablet 0   famotidine (PEPCID) 20 MG tablet Take 40 mg by mouth at bedtime.      GEMTESA 75 MG TABS Take 1 tablet by mouth daily.     Multiple Vitamins-Minerals (CENTRUM SILVER 50+MEN PO) Take 1 Capful by mouth daily.     omeprazole (PRILOSEC OTC) 20 MG tablet Take 20 mg by mouth every morning.      tadalafil (CIALIS) 5 MG tablet Take 5 mg by mouth daily as needed for erectile dysfunction.     No current facility-administered medications on file prior to visit.    Review of Systems  Constitutional:  Negative for activity change, appetite change, fatigue, fever and unexpected weight change.  HENT:  Negative for congestion, rhinorrhea, sore throat and trouble swallowing.   Eyes:  Negative for pain, redness, itching and visual disturbance.  Respiratory:  Negative for cough, chest tightness, shortness of breath and wheezing.   Cardiovascular:  Negative for chest pain and palpitations.  Gastrointestinal:  Negative for abdominal pain, blood in  stool, constipation, diarrhea and nausea.  Endocrine: Negative for cold intolerance, heat intolerance, polydipsia and polyuria.  Genitourinary:  Negative for difficulty urinating, dysuria, frequency and urgency.  Musculoskeletal:  Positive for arthralgias. Negative for joint swelling and myalgias.  Skin:  Negative for pallor and rash.  Neurological:  Negative for dizziness, tremors, weakness, numbness and headaches.  Hematological:  Negative for adenopathy. Does not bruise/bleed easily.  Psychiatric/Behavioral:  Negative for decreased concentration and dysphoric mood. The patient is not nervous/anxious.        Objective:   Physical Exam Constitutional:      General: He is not in acute distress.    Appearance: Normal appearance. He is well-developed and normal weight. He is not ill-appearing or diaphoretic.  HENT:     Head: Normocephalic and atraumatic.     Right Ear: Tympanic membrane, ear canal and external ear normal.     Left Ear: Tympanic membrane, ear canal and external ear normal.     Nose: Nose normal. No congestion.     Mouth/Throat:     Mouth: Mucous membranes are moist.     Pharynx: Oropharynx is clear. No posterior oropharyngeal erythema.  Eyes:     General: No scleral icterus.       Right eye: No discharge.        Left eye: No discharge.     Conjunctiva/sclera: Conjunctivae normal.     Pupils: Pupils are equal, round, and reactive to light.  Neck:     Thyroid: No thyromegaly.     Vascular: No carotid bruit or JVD.  Cardiovascular:     Rate and Rhythm: Normal rate and regular rhythm.  Pulses: Normal pulses.     Heart sounds: Normal heart sounds.     No gallop.  Pulmonary:     Effort: Pulmonary effort is normal. No respiratory distress.     Breath sounds: Normal breath sounds. No wheezing or rales.     Comments: Good air exch Chest:     Chest wall: No tenderness.  Abdominal:     General: Bowel sounds are normal. There is no distension or abdominal bruit.      Palpations: Abdomen is soft. There is no mass.     Tenderness: There is no abdominal tenderness.     Hernia: No hernia is present.  Musculoskeletal:        General: No tenderness.     Cervical back: Normal range of motion and neck supple. No rigidity. No muscular tenderness.     Right lower leg: No edema.     Left lower leg: No edema.  Lymphadenopathy:     Cervical: No cervical adenopathy.  Skin:    General: Skin is warm and dry.     Coloration: Skin is not pale.     Findings: No erythema or rash.     Comments: Fair  Solar lentigines diffusely   Neurological:     Mental Status: He is alert.     Cranial Nerves: No cranial nerve deficit.     Motor: No abnormal muscle tone.     Coordination: Coordination normal.     Gait: Gait normal.     Deep Tendon Reflexes: Reflexes are normal and symmetric. Reflexes normal.  Psychiatric:        Mood and Affect: Mood normal.        Cognition and Memory: Cognition and memory normal.           Assessment & Plan:   Problem List Items Addressed This Visit       Digestive   GERD    Pt continues omeprazole 20 mg daily (unable to come off of it) and pepcid 20-40 mg daily  Watches diet   We continue to watch B12 level Takes mvi        Genitourinary   BPH (benign prostatic hyperplasia)    Continues urology care Reviewed last note from April TURP in past  Lab Results  Component Value Date   PSA 1.24 10/30/2022   PSA 0.55 10/28/2021   PSA 0.52 10/28/2020    Taking gemtesa         Other   Colon cancer screening    Colonoscopy 03/2022 (after positive cologuard) noted polyps Has 3 y recall if healthy enough      Current use of proton pump inhibitor    Lab Results  Component Value Date   VITAMINB12 775 10/30/2022    Takes mvi and B12 Unable to come off ppi      Elevated glucose level    Lab Results  Component Value Date   HGBA1C 5.4 10/30/2022    disc imp of low glycemic diet and wt loss to prevent DM2        History of colonic polyps    Colonoscopy 03/2022  Recall for 3 y if no contraindications       Hyperlipidemia    Disc goals for lipids and reasons to control them Rev last labs with pt Rev low sat fat diet in detail  LDL 109- improved         Positive colorectal cancer screening using Cologuard test    In 03/2022 positive  cologuard Colonoscopy did show polyps       Routine general medical examination at a health care facility - Primary    Reviewed health habits including diet and exercise and skin cancer prevention Reviewed appropriate screening tests for age  Also reviewed health mt list, fam hx and immunization status , as well as social and family history   See HPI Labs reviewed and ordered Plans to inq about tetanus shot at the pharmacy  Flu shot is utd Continues urology visit for prostate health and psa 1.24 this check  Colonoscopy 03/2022 with 3 y recall for polyps Discussed fall prevention, supplements and exercise for bone density   No falls or fracture PHQ 0

## 2022-11-06 ENCOUNTER — Encounter: Payer: Self-pay | Admitting: Family Medicine

## 2022-11-06 ENCOUNTER — Ambulatory Visit (INDEPENDENT_AMBULATORY_CARE_PROVIDER_SITE_OTHER): Payer: Medicare PPO | Admitting: Family Medicine

## 2022-11-06 VITALS — BP 124/68 | HR 70 | Temp 97.9°F | Ht 71.15 in | Wt 208.0 lb

## 2022-11-06 DIAGNOSIS — Z1211 Encounter for screening for malignant neoplasm of colon: Secondary | ICD-10-CM

## 2022-11-06 DIAGNOSIS — Z0000 Encounter for general adult medical examination without abnormal findings: Secondary | ICD-10-CM | POA: Diagnosis not present

## 2022-11-06 DIAGNOSIS — K219 Gastro-esophageal reflux disease without esophagitis: Secondary | ICD-10-CM | POA: Diagnosis not present

## 2022-11-06 DIAGNOSIS — R195 Other fecal abnormalities: Secondary | ICD-10-CM

## 2022-11-06 DIAGNOSIS — R7309 Other abnormal glucose: Secondary | ICD-10-CM | POA: Diagnosis not present

## 2022-11-06 DIAGNOSIS — N400 Enlarged prostate without lower urinary tract symptoms: Secondary | ICD-10-CM | POA: Diagnosis not present

## 2022-11-06 DIAGNOSIS — Z79899 Other long term (current) drug therapy: Secondary | ICD-10-CM | POA: Diagnosis not present

## 2022-11-06 DIAGNOSIS — Z860100 Personal history of colon polyps, unspecified: Secondary | ICD-10-CM

## 2022-11-06 DIAGNOSIS — E7800 Pure hypercholesterolemia, unspecified: Secondary | ICD-10-CM | POA: Diagnosis not present

## 2022-11-06 NOTE — Assessment & Plan Note (Signed)
Reviewed health habits including diet and exercise and skin cancer prevention Reviewed appropriate screening tests for age  Also reviewed health mt list, fam hx and immunization status , as well as social and family history   See HPI Labs reviewed and ordered Plans to inq about tetanus shot at the pharmacy  Flu shot is utd Continues urology visit for prostate health and psa 1.24 this check  Colonoscopy 03/2022 with 3 y recall for polyps Discussed fall prevention, supplements and exercise for bone density   No falls or fracture PHQ 0

## 2022-11-06 NOTE — Assessment & Plan Note (Signed)
Lab Results  Component Value Date   HGBA1C 5.4 10/30/2022    disc imp of low glycemic diet and wt loss to prevent DM2

## 2022-11-06 NOTE — Assessment & Plan Note (Signed)
Colonoscopy 03/2022 (after positive cologuard) noted polyps Has 3 y recall if healthy enough

## 2022-11-06 NOTE — Assessment & Plan Note (Signed)
Lab Results  Component Value Date   VITAMINB12 775 10/30/2022    Takes mvi and B12 Unable to come off ppi

## 2022-11-06 NOTE — Patient Instructions (Addendum)
Check in with pharmacist about a tetanus shot   Keep walking Add some strength training to your routine, this is important for bone and brain health and can reduce your risk of falls and help your body use insulin properly and regulate weight  Light weights, exercise bands , and internet videos are a good way to start  Yoga (chair or regular), machines , floor exercises or a gym with machines are also good options     To prevent diabetes and prediabetes Try to get most of your carbohydrates from produce (with the exception of white potatoes) and whole grains Eat less bread/pasta/rice/snack foods/cereals/sweets and other items from the middle of the grocery store (processed carbs)  For cholesterol Avoid red meat/ fried foods/ egg yolks/ fatty breakfast meats/ butter, cheese and high fat dairy/ and shellfish            Mediterranean Diet  Why follow it? Research shows. Those who follow the Mediterranean diet have a reduced risk of heart disease  The diet is associated with a reduced incidence of Parkinson's and Alzheimer's diseases People following the diet may have longer life expectancies and lower rates of chronic diseases  The Dietary Guidelines for Americans recommends the Mediterranean diet as an eating plan to promote health and prevent disease  What Is the Mediterranean Diet?  Healthy eating plan based on typical foods and recipes of Mediterranean-style cooking The diet is primarily a plant based diet; these foods should make up a majority of meals   Starches - Plant based foods should make up a majority of meals - They are an important sources of vitamins, minerals, energy, antioxidants, and fiber - Choose whole grains, foods high in fiber and minimally processed items  - Typical grain sources include wheat, oats, barley, corn, brown rice, bulgar, farro, millet, polenta, couscous  - Various types of beans include chickpeas, lentils, fava beans, black beans, white beans    Fruits  Veggies - Large quantities of antioxidant rich fruits & veggies; 6 or more servings  - Vegetables can be eaten raw or lightly drizzled with oil and cooked  - Vegetables common to the traditional Mediterranean Diet include: artichokes, arugula, beets, broccoli, brussel sprouts, cabbage, carrots, celery, collard greens, cucumbers, eggplant, kale, leeks, lemons, lettuce, mushrooms, okra, onions, peas, peppers, potatoes, pumpkin, radishes, rutabaga, shallots, spinach, sweet potatoes, turnips, zucchini - Fruits common to the Mediterranean Diet include: apples, apricots, avocados, cherries, clementines, dates, figs, grapefruits, grapes, melons, nectarines, oranges, peaches, pears, pomegranates, strawberries, tangerines  Fats - Replace butter and margarine with healthy oils, such as olive oil, canola oil, and tahini  - Limit nuts to no more than a handful a day  - Nuts include walnuts, almonds, pecans, pistachios, pine nuts  - Limit or avoid candied, honey roasted or heavily salted nuts - Olives are central to the [employer] - can be eaten whole or used in a variety of dishes   Meats Protein - Limiting red meat: no more than a few times a month - When eating red meat: choose lean cuts and keep the portion to the size of deck of cards - Eggs: approx. 0 to 4 times a week  - Fish and lean poultry: at least 2 a week  - Healthy protein sources include, chicken, [place], lean beef, lamb - Increase intake of seafood such as tuna, salmon, trout, mackerel, shrimp, scallops - Avoid or limit high fat processed meats such as sausage and bacon  Dairy - Include moderate amounts of  low fat dairy products  - Focus on healthy dairy such as fat free yogurt, skim milk, low or reduced fat cheese - Limit dairy products higher in fat such as whole or 2% milk, cheese, ice cream  Alcohol - Moderate amounts of red wine is ok  - No more than 5 oz daily for women (all ages) and men older than age 58  - No more  than 10 oz of wine daily for men younger than 3  Other - Limit sweets and other desserts  - Use herbs and spices instead of salt to flavor foods  - Herbs and spices common to the traditional Mediterranean Diet include: basil, bay leaves, chives, cloves, cumin, fennel, garlic, lavender, marjoram, mint, oregano, parsley, pepper, rosemary, sage, savory, sumac, tarragon, thyme   It's not just a diet, it's a lifestyle:  The Mediterranean diet includes lifestyle factors typical of those in the region  Foods, drinks and meals are best eaten with others and savored Daily physical activity is important for overall good health This could be strenuous exercise like running and aerobics This could also be more leisurely activities such as walking, housework, yard-work, or taking the stairs Moderation is the key; a balanced and healthy diet accommodates most foods and drinks Consider portion sizes and frequency of consumption of certain foods   Meal Ideas & Options:  Breakfast:  Whole wheat toast or whole wheat English muffins with peanut butter & hard boiled egg Steel cut oats topped with apples & cinnamon and skim milk  Fresh fruit: banana, strawberries, melon, berries, peaches  Smoothies: strawberries, bananas, greek yogurt, peanut butter Low fat greek yogurt with blueberries and granola  Egg white omelet with spinach and mushrooms Breakfast couscous: whole wheat couscous, apricots, skim milk, cranberries  Sandwiches:  Hummus and grilled vegetables (peppers, zucchini, squash) on whole wheat bread   Grilled chicken on whole wheat pita with lettuce, tomatoes, cucumbers or tzatziki  [place] salad on whole wheat bread: tuna salad made with greek yogurt, olives, red peppers, capers, green onions Garlic rosemary lamb pita: lamb sauted with garlic, rosemary, salt & pepper; add lettuce, cucumber, greek yogurt to pita - flavor with lemon juice and black pepper  Seafood:  Mediterranean grilled salmon,  seasoned with garlic, basil, parsley, lemon juice and black pepper Shrimp, lemon, and spinach whole-grain pasta salad made with low fat greek yogurt  Seared scallops with lemon orzo  Seared tuna steaks seasoned salt, pepper, coriander topped with tomato mixture of olives, tomatoes, olive oil, minced garlic, parsley, green onions and cappers  Meats:  Herbed greek chicken salad with kalamata olives, cucumber, feta  Red bell peppers stuffed with spinach, bulgur, lean ground beef (or lentils) & topped with feta   Kebabs: skewers of chicken, tomatoes, onions, zucchini, squash  [place] burgers: made with red onions, mint, dill, lemon juice, feta cheese topped with roasted red peppers Vegetarian Cucumber salad: cucumbers, artichoke hearts, celery, red onion, feta cheese, tossed in olive oil & lemon juice  Hummus and whole grain pita points with a greek salad (lettuce, tomato, feta, olives, cucumbers, red onion) Lentil soup with celery, carrots made with vegetable broth, garlic, salt and pepper  Tabouli salad: parsley, bulgur, mint, scallions, cucumbers, tomato, radishes, lemon juice, olive oil, salt and pepper.

## 2022-11-06 NOTE — Assessment & Plan Note (Signed)
In 03/2022 positive cologuard Colonoscopy did show polyps

## 2022-11-06 NOTE — Assessment & Plan Note (Signed)
Disc goals for lipids and reasons to control them Rev last labs with pt Rev low sat fat diet in detail  LDL 109- improved

## 2022-11-06 NOTE — Assessment & Plan Note (Signed)
Pt continues omeprazole 20 mg daily (unable to come off of it) and pepcid 20-40 mg daily  Watches diet   We continue to watch B12 level Takes mvi

## 2022-11-06 NOTE — Assessment & Plan Note (Signed)
Continues urology care Reviewed last note from April TURP in past  Lab Results  Component Value Date   PSA 1.24 10/30/2022   PSA 0.55 10/28/2021   PSA 0.52 10/28/2020    Taking gemtesa

## 2022-11-06 NOTE — Assessment & Plan Note (Signed)
Colonoscopy 03/2022  Recall for 3 y if no contraindications

## 2022-11-28 ENCOUNTER — Ambulatory Visit (INDEPENDENT_AMBULATORY_CARE_PROVIDER_SITE_OTHER): Payer: Medicare PPO

## 2022-11-28 VITALS — Ht 72.0 in | Wt 208.0 lb

## 2022-11-28 DIAGNOSIS — Z0000 Encounter for general adult medical examination without abnormal findings: Secondary | ICD-10-CM

## 2022-11-28 NOTE — Progress Notes (Signed)
Subjective:   Darren [name] is a 77 y.o. male who presents for Medicare Annual/Subsequent preventive examination.  Visit Complete: Virtual I connected with  Darren [name] on 11/28/22 by a audio enabled telemedicine application and verified that I am speaking with the correct person using two identifiers.  Patient Location: Home  Provider Location: Office/Clinic  I discussed the limitations of evaluation and management by telemedicine. The patient expressed understanding and agreed to proceed.  Vital Signs: Because this visit was a virtual/telehealth visit, some criteria may be missing or patient reported. Any vitals not documented were not able to be obtained and vitals that have been documented are patient reported.  Patient Medicare AWV questionnaire was completed by the patient on (not done); I have confirmed that all information answered by patient is correct and no changes since this date. Cardiac Risk Factors include: advanced age (>9men, >38 women);dyslipidemia;male gender    Objective:    Today's Vitals   11/28/22 1039  Weight: 208 lb (94.3 kg)  Height: 6' (1.829 m)   Body mass index is 28.21 kg/m.     11/28/2022   10:52 AM 11/10/2021    1:04 PM 10/10/2019   11:20 AM 10/08/2018    3:09 PM 10/05/2017    9:59 AM 06/19/2017    9:27 AM 09/17/2015   10:14 AM  Advanced Directives  Does Patient Have a Medical Advance Directive? Yes Yes Yes Yes Yes Yes Yes  Type of Estate agent of [place];Living will Healthcare Power of [place];Living will Healthcare Power of [place];Living will Healthcare Power of [place];Living will Healthcare Power of [place];Living will Healthcare Power of [place];Living will Healthcare Power of [place];Living will  Does patient want to make changes to medical advance directive?      No - Patient declined No - Patient declined  Copy of Healthcare Power of Attorney in Chart? No - copy requested No - copy requested No -  copy requested No - copy requested No - copy requested No - copy requested No - copy requested    Current Medications (verified) Outpatient Encounter Medications as of 11/28/2022  Medication Sig   ALPRAZolam (XANAX) 0.5 MG tablet Take 1 by mouth up to twice daily for airplane flight   famotidine (PEPCID) 20 MG tablet Take 40 mg by mouth at bedtime.    GEMTESA 75 MG TABS Take 1 tablet by mouth daily.   Multiple Vitamins-Minerals (CENTRUM SILVER 50+MEN PO) Take 1 Capful by mouth daily.   omeprazole (PRILOSEC OTC) 20 MG tablet Take 20 mg by mouth every morning.    tadalafil (CIALIS) 5 MG tablet Take 5 mg by mouth daily as needed for erectile dysfunction.   No facility-administered encounter medications on file as of 11/28/2022.    Allergies (verified) Hydrocodone and Penicillins   History: Past Medical History:  Diagnosis Date   Basal cell carcinoma    face   Colon polyp 2023   GERD (gastroesophageal reflux disease)    History of colon polyps    Hypertrophy of prostate without urinary obstruction and other lower urinary tract symptoms (LUTS)    Leukocytopenia, unspecified    Other and unspecified hyperlipidemia    no meds   Overactive bladder    Past Surgical History:  Procedure Laterality Date   COLONOSCOPY     multiple - polyps   PROSTATE SURGERY  08/2012   TURP  at [facility]   ROTATOR CUFF REPAIR Right 2003   UPPER GI ENDOSCOPY     WISDOM TOOTH  EXTRACTION     Family History  Problem Relation Age of Onset   Heart attack Father    Breast cancer Mother    Thyroid cancer Maternal Grandmother    Colon cancer Neg Hx    Esophageal cancer Neg Hx    Rectal cancer Neg Hx    Stomach cancer Neg Hx    Social History   Socioeconomic History   Marital status: Married    Spouse name: Not on file   Number of children: 1   Years of education: Not on file   Highest education level: Not on file  Occupational History   Occupation: retired college professor  Tobacco Use   Smoking  status: Former    Current packs/day: 0.00    Average packs/day: 1 pack/day for 5.0 years (5.0 ttl pk-yrs)    Types: Cigarettes    Start date: 01/24/1963    Quit date: 01/24/1968    Years since quitting: 54.8   Smokeless tobacco: Never  Vaping Use   Vaping status: Never Used  Substance and Sexual Activity   Alcohol use: Yes    Alcohol/week: 14.0 - 21.0 standard drinks of alcohol    Types: 14 - 21 Glasses of wine per week    Comment: 2-3 glasses wine/day   Drug use: No   Sexual activity: Yes  Other Topics Concern   Not on file  Social History Narrative   Not on file   Social Determinants of Health   Financial Resource Strain: Low Risk  (11/28/2022)   Overall Financial Resource Strain (CARDIA)    Difficulty of Paying Living Expenses: Not hard at all  Food Insecurity: No Food Insecurity (11/28/2022)   Hunger Vital Sign    Worried About Running Out of Food in the Last Year: Never true    Ran Out of Food in the Last Year: Never true  Transportation Needs: No Transportation Needs (11/28/2022)   PRAPARE - Administrator, Civil Service (Medical): No    Lack of Transportation (Non-Medical): No  Physical Activity: Sufficiently Active (11/28/2022)   Exercise Vital Sign    Days of Exercise per Week: 4 days    Minutes of Exercise per Session: 40 min  Stress: No Stress Concern Present (11/28/2022)   [employer] of Occupational Health - Occupational Stress Questionnaire    Feeling of Stress : Not at all  Social Connections: Moderately Integrated (11/28/2022)   Social Connection and Isolation Panel [NHANES]    Frequency of Communication with Friends and Family: More than three times a week    Frequency of Social Gatherings with Friends and Family: Twice a week    Attends Religious Services: More than 4 times per year    Active Member of [employer] or Organizations: No    Attends Engineer, structural: Never    Marital Status: Married    Tobacco Counseling Counseling  given: Not Answered   Clinical Intake:  Pre-visit preparation completed: No  Pain : No/denies pain     BMI - recorded: 28.21 Nutritional Status: BMI 25 -29 Overweight Nutritional Risks: None Diabetes: No  How often do you need to have someone help you when you read instructions, pamphlets, or other written materials from your doctor or pharmacy?: 1 - Never  Interpreter Needed?: No  Comments: lives with wife Information entered by :: B.Myeshia Fojtik,LPN   Activities of Daily Living    11/28/2022   10:53 AM  In your present state of health, do you have any difficulty performing  the following activities:  Hearing? 1  Vision? 0  Difficulty concentrating or making decisions? 0  Walking or climbing stairs? 0  Dressing or bathing? 0  Doing errands, shopping? 0  Preparing Food and eating ? N  Using the Toilet? N  In the past six months, have you accidently leaked urine? N  Do you have problems with loss of bowel control? N  Managing your Medications? N  Managing your Finances? N  Housekeeping or managing your Housekeeping? N    Patient Care Team: Tower, Audrie Gallus, MD as PCP - [facility], [facility] (Ophthalmology)  Indicate any recent Medical Services you may have received from other than [facility] providers in the past year (date may be approximate).     Assessment:   This is a routine wellness examination for Chais.  Hearing/Vision screen Hearing Screening - Comments:: Pt says he hears a little less sometimes but adequate Vision Screening - Comments:: Pt says his vision is good with glasses Dr [facility]   Goals Addressed             This Visit's Progress    COMPLETED: Increase physical activity   On track    Starting 10/05/2017, I will continue to exercise for at least 60 min 5-6 days per week.      COMPLETED: Patient Stated   On track    10/08/18, Patient wants to get his cataract surgery done to improve his vision.     COMPLETED:  Patient Stated   On track    10/10/2019, I will continue to walk daily for about 10,000 steps.      Patient Stated       I want to maintain your health and stay active       Depression Screen    11/28/2022   10:48 AM 11/06/2022   10:34 AM 04/10/2022   11:02 AM 11/10/2021    1:16 PM 11/10/2021    1:09 PM 11/01/2021    9:53 AM 10/28/2020    9:00 AM  PHQ 2/9 Scores  PHQ - 2 Score 0 0 0 0 0 0 0  PHQ- 9 Score  0 0 0 0 0     Fall Risk    11/28/2022   10:43 AM 11/06/2022   10:34 AM 04/10/2022   11:02 AM 11/10/2021    1:04 PM 11/01/2021    9:53 AM  Fall Risk   Falls in the past year? 0 0 0 0 0  Number falls in past yr: 0 0 0 0   Injury with Fall? 0 0 0 0   Risk for fall due to : No Fall Risks No Fall Risks No Fall Risks    Follow up Education provided;Falls prevention discussed Falls evaluation completed Falls evaluation completed Falls evaluation completed;Education provided;Falls prevention discussed Falls evaluation completed    MEDICARE RISK AT HOME: Medicare Risk at Home Any stairs in or around the home?: Yes If so, are there any without handrails?: Yes Home free of loose throw rugs in walkways, pet beds, electrical cords, etc?: Yes Adequate lighting in your home to reduce risk of falls?: Yes Life alert?: No Use of a cane, walker or w/c?: No Grab bars in the bathroom?: Yes Shower chair or bench in shower?: Yes Elevated toilet seat or a handicapped toilet?: No  TIMED UP AND GO:  Was the test performed?  No    Cognitive Function:    10/10/2019   11:23 AM 10/08/2018  3:15 PM 10/05/2017    9:38 AM 09/17/2015   10:28 AM  MMSE - Mini Mental State Exam  Orientation to time 5 5 5 5   Orientation to Place 5 5 5 5   Registration 3 3 3 3   Attention/ Calculation 5 5 0 0  Recall 3 3 3 3   Language- name 2 objects   0 0  Language- repeat 1 1 1 1   Language- follow 3 step command   3 3  Language- read & follow direction   0 0  Write a sentence   0 0  Copy design   0 0   Total score   20 20        11/28/2022   10:54 AM 11/10/2021    1:05 PM  6CIT Screen  What Year? 0 points 0 points  What month? 0 points 0 points  What time? 0 points 0 points  Count back from 20 0 points 0 points  Months in reverse 0 points 0 points  Repeat phrase 0 points 0 points  Total Score 0 points 0 points    Immunizations Immunization History  Administered Date(s) Administered   Fluad Quad(high Dose 65+) 10/01/2018, 10/14/2019   H1N1 02/06/2008   Hepatitis A 11/26/2003, 06/24/2004   Influenza Split 10/26/2010, 10/23/2011   Influenza Whole 12/15/2003, 11/16/2006, 10/27/2008, 10/14/2009   Influenza, High Dose Seasonal PF 09/24/2018, 10/28/2021, 10/18/2022   Influenza,inj,Quad PF,6+ Mos 10/09/2012, 10/29/2013, 10/13/2014, 10/29/2015, 11/10/2016, 10/05/2017   MODERNA COVID-19 SARS-COV-2 PEDS BIVALENT BOOSTER 57yr-75yr 05/04/2020, 09/29/2020   Moderna Covid-19 Vaccine Bivalent Booster 57yrs & up 06/24/2021   PFIZER(Purple Top)SARS-COV-2 Vaccination 02/27/2019, 03/24/2019   Pfizer Covid-19 Vaccine Bivalent Booster 33yrs & up 10/17/2021   Pneumococcal Conjugate-13 09/17/2015   Pneumococcal Polysaccharide-23 09/09/2007, 05/13/2013   Respiratory Syncytial Virus Vaccine,Recomb Aduvanted(Arexvy) 09/27/2021   Td 07/29/1998, 12/02/2008   Zoster Recombinant(Shingrix) 11/27/2017, 02/11/2018   Zoster, Live 09/28/2005    TDAP status: Up to date  Flu Vaccine status: Up to date  Pneumococcal vaccine status: Up to date  Covid-19 vaccine status: Completed vaccines  Qualifies for Shingles Vaccine? Yes   Zostavax completed Yes   Shingrix Completed?: Yes  Screening Tests Health Maintenance  Topic Date Due   DTaP/Tdap/Td (3 - Tdap) 11/06/2023 (Originally 12/03/2018)   COVID-19 Vaccine (7 - 2023-24 season) 11/22/2023 (Originally 09/24/2022)   Medicare Annual Wellness (AWV)  11/28/2023   Colonoscopy  03/26/2025   Pneumonia Vaccine 4+ Years old  Completed   INFLUENZA VACCINE   Completed   Hepatitis C Screening  Completed   Zoster Vaccines- Shingrix  Completed   HPV VACCINES  Aged Out    Health Maintenance  There are no preventive care reminders to display for this patient.   Colorectal cancer screening: No longer required.   Lung Cancer Screening: (Low Dose CT Chest recommended if Age 59-80 years, 20 pack-year currently smoking OR have quit w/in 15years.) does not qualify.   Lung Cancer Screening Referral: no  Additional Screening:  Hepatitis C Screening: does not qualify; Completed 09/17/2015  Vision Screening: Recommended annual ophthalmology exams for early detection of glaucoma and other disorders of the eye. Is the patient up to date with their annual eye exam?  Yes  Who is the provider or what is the name of the office in which the patient attends annual eye exams? Dr Marcellus Scott If pt is not established with a provider, would they like to be referred to a provider to establish care? No .   Dental Screening: Recommended  annual dental exams for proper oral hygiene  Diabetic Foot Exam: n/a  Community Resource Referral / Chronic Care Management: CRR required this visit?  No   CCM required this visit?  No    Plan:     I have personally reviewed and noted the following in the patient's chart:   Medical and social history Use of alcohol, tobacco or illicit drugs  Current medications and supplements including opioid prescriptions. Patient is not currently taking opioid prescriptions. Functional ability and status Nutritional status Physical activity Advanced directives List of other physicians Hospitalizations, surgeries, and ER visits in previous 12 months Vitals Screenings to include cognitive, depression, and falls Referrals and appointments  In addition, I have reviewed and discussed with patient certain preventive protocols, quality metrics, and best practice recommendations. A written personalized care plan for preventive services  as well as general preventive health recommendations were provided to patient.     Sue Lush, LPN   16/01/958   After Visit Summary: (MyChart) Due to this being a telephonic visit, the after visit summary with patients personalized plan was offered to patient via MyChart   Nurse Notes: Pt says he is doing well. He reiterates he will go to his phamacy to get TDAP soon. He has no concerns or questions at this time.

## 2022-11-28 NOTE — Patient Instructions (Signed)
Darren [name] , Thank you for taking time to come for your Medicare Wellness Visit. I appreciate your ongoing commitment to your health goals. Please review the following plan we discussed and let me know if I can assist you in the future.   Referrals/Orders/Follow-Ups/Clinician Recommendations: none  This is a list of the screening recommended for you and due dates:  Health Maintenance  Topic Date Due   DTaP/Tdap/Td vaccine (3 - Tdap) 11/06/2023*   COVID-19 Vaccine (7 - 2023-24 season) 11/22/2023*   Medicare Annual Wellness Visit  11/28/2023   Colon Cancer Screening  03/26/2025   Pneumonia Vaccine  Completed   Flu Shot  Completed   Hepatitis C Screening  Completed   Zoster (Shingles) Vaccine  Completed   HPV Vaccine  Aged Out  *Topic was postponed. The date shown is not the original due date.    Advanced directives: (Copy Requested) Please bring a copy of your health care power of attorney and living will to the office to be added to your chart at your convenience.  Next Medicare Annual Wellness Visit scheduled for next year: Yes 11/29/23 @ 11:30am telephone

## 2023-02-01 DIAGNOSIS — Z85828 Personal history of other malignant neoplasm of skin: Secondary | ICD-10-CM | POA: Diagnosis not present

## 2023-02-01 DIAGNOSIS — D485 Neoplasm of uncertain behavior of skin: Secondary | ICD-10-CM | POA: Diagnosis not present

## 2023-02-01 DIAGNOSIS — Z08 Encounter for follow-up examination after completed treatment for malignant neoplasm: Secondary | ICD-10-CM | POA: Diagnosis not present

## 2023-02-01 DIAGNOSIS — L814 Other melanin hyperpigmentation: Secondary | ICD-10-CM | POA: Diagnosis not present

## 2023-02-01 DIAGNOSIS — L821 Other seborrheic keratosis: Secondary | ICD-10-CM | POA: Diagnosis not present

## 2023-02-01 DIAGNOSIS — L988 Other specified disorders of the skin and subcutaneous tissue: Secondary | ICD-10-CM | POA: Diagnosis not present

## 2023-02-01 DIAGNOSIS — L570 Actinic keratosis: Secondary | ICD-10-CM | POA: Diagnosis not present

## 2023-02-01 DIAGNOSIS — D2371 Other benign neoplasm of skin of right lower limb, including hip: Secondary | ICD-10-CM | POA: Diagnosis not present

## 2023-05-18 DIAGNOSIS — N5201 Erectile dysfunction due to arterial insufficiency: Secondary | ICD-10-CM | POA: Diagnosis not present

## 2023-05-18 DIAGNOSIS — R350 Frequency of micturition: Secondary | ICD-10-CM | POA: Diagnosis not present

## 2023-05-18 DIAGNOSIS — N401 Enlarged prostate with lower urinary tract symptoms: Secondary | ICD-10-CM | POA: Diagnosis not present

## 2023-07-18 ENCOUNTER — Telehealth: Payer: Self-pay

## 2023-07-18 NOTE — Telephone Encounter (Signed)
 Please schedule lab appt prior to CPE (PCP will put orders in prior to appt)

## 2023-07-18 NOTE — Telephone Encounter (Signed)
 I don't want to order this far ahead in case chronic health list changes Please send me reminder to order labs several weeks prior to appointment  Thanks

## 2023-07-18 NOTE — Telephone Encounter (Signed)
 Copied from CRM [id number]. Topic: Clinical - Request for Lab/Test Order >> Jul 18, 2023  1:28 PM Darren [name] wrote: Reason for CRM: Patient called requesting blood work prior to his physical 11/29/23.

## 2023-08-02 DIAGNOSIS — M1812 Unilateral primary osteoarthritis of first carpometacarpal joint, left hand: Secondary | ICD-10-CM | POA: Diagnosis not present

## 2023-08-02 DIAGNOSIS — M20022 Boutonniere deformity of left finger(s): Secondary | ICD-10-CM | POA: Diagnosis not present

## 2023-08-02 DIAGNOSIS — G5602 Carpal tunnel syndrome, left upper limb: Secondary | ICD-10-CM | POA: Diagnosis not present

## 2023-10-26 DIAGNOSIS — Z961 Presence of intraocular lens: Secondary | ICD-10-CM | POA: Diagnosis not present

## 2023-11-25 ENCOUNTER — Telehealth: Payer: Self-pay | Admitting: Family Medicine

## 2023-11-25 ENCOUNTER — Encounter: Payer: Self-pay | Admitting: Family Medicine

## 2023-11-25 DIAGNOSIS — N400 Enlarged prostate without lower urinary tract symptoms: Secondary | ICD-10-CM

## 2023-11-25 DIAGNOSIS — E7800 Pure hypercholesterolemia, unspecified: Secondary | ICD-10-CM

## 2023-11-25 DIAGNOSIS — R7309 Other abnormal glucose: Secondary | ICD-10-CM

## 2023-11-25 DIAGNOSIS — Z79899 Other long term (current) drug therapy: Secondary | ICD-10-CM

## 2023-11-25 NOTE — Telephone Encounter (Signed)
-----   Message from Veva JINNY Ferrari sent at 11/19/2023  3:35 PM EDT ----- Regarding: Lab orders forTue, 11.4.25 Patient is scheduled for CPX labs, please order future labs, Thanks , Lenore

## 2023-11-27 ENCOUNTER — Other Ambulatory Visit (INDEPENDENT_AMBULATORY_CARE_PROVIDER_SITE_OTHER)

## 2023-11-27 DIAGNOSIS — R7309 Other abnormal glucose: Secondary | ICD-10-CM

## 2023-11-27 DIAGNOSIS — E7800 Pure hypercholesterolemia, unspecified: Secondary | ICD-10-CM | POA: Diagnosis not present

## 2023-11-27 DIAGNOSIS — Z79899 Other long term (current) drug therapy: Secondary | ICD-10-CM | POA: Diagnosis not present

## 2023-11-27 DIAGNOSIS — N400 Enlarged prostate without lower urinary tract symptoms: Secondary | ICD-10-CM | POA: Diagnosis not present

## 2023-11-27 LAB — COMPREHENSIVE METABOLIC PANEL WITH GFR
ALT: 18 U/L (ref 0–53)
AST: 17 U/L (ref 0–37)
Albumin: 4.1 g/dL (ref 3.5–5.2)
Alkaline Phosphatase: 37 U/L — ABNORMAL LOW (ref 39–117)
BUN: 16 mg/dL (ref 6–23)
CO2: 26 meq/L (ref 19–32)
Calcium: 9 mg/dL (ref 8.4–10.5)
Chloride: 105 meq/L (ref 96–112)
Creatinine, Ser: 0.86 mg/dL (ref 0.40–1.50)
GFR: 82.78 mL/min (ref >60.00)
Glucose, Bld: 88 mg/dL (ref 70–99)
Potassium: 4 meq/L (ref 3.5–5.1)
Sodium: 138 meq/L (ref 135–145)
Total Bilirubin: 0.7 mg/dL (ref 0.2–1.2)
Total Protein: 6.3 g/dL (ref 6.0–8.3)

## 2023-11-27 LAB — LIPID PANEL
Cholesterol: 202 mg/dL — ABNORMAL HIGH (ref 0–200)
HDL: 74.8 mg/dL (ref >39.00)
LDL Cholesterol: 115 mg/dL — ABNORMAL HIGH (ref 0–99)
NonHDL: 127.09
Total CHOL/HDL Ratio: 3
Triglycerides: 60 mg/dL (ref 0.0–149.0)
VLDL: 12 mg/dL (ref 0.0–40.0)

## 2023-11-27 LAB — HEMOGLOBIN A1C: Hgb A1c MFr Bld: 5.5 % (ref 4.6–6.5)

## 2023-11-28 ENCOUNTER — Ambulatory Visit: Payer: Self-pay | Admitting: Family Medicine

## 2023-11-28 LAB — TSH: TSH: 3.03 u[IU]/mL (ref 0.35–5.50)

## 2023-11-28 LAB — PSA, MEDICARE: PSA: 0.72 ng/mL (ref 0.10–4.00)

## 2023-11-28 LAB — VITAMIN B12: Vitamin B-12: 560 pg/mL (ref 211–911)

## 2023-11-29 ENCOUNTER — Ambulatory Visit: Payer: Medicare PPO

## 2023-11-29 VITALS — Ht 72.0 in | Wt 208.0 lb

## 2023-11-29 DIAGNOSIS — Z0000 Encounter for general adult medical examination without abnormal findings: Secondary | ICD-10-CM

## 2023-11-29 NOTE — Patient Instructions (Signed)
 Mr. Darren [name],  Thank you for taking the time for your Medicare Wellness Visit. I appreciate your continued commitment to your health goals. Please review the care plan we discussed, and feel free to reach out if I can assist you further.  Please note that Annual Wellness Visits do not include a physical exam. Some assessments may be limited, especially if the visit was conducted virtually. If needed, we may recommend an in-person follow-up with your provider.  Ongoing Care Seeing your primary care provider every 3 to 6 months helps us  monitor your health and provide consistent, personalized care.   Referrals If a referral was made during today's visit and you haven't received any updates within two weeks, please contact the referred provider directly to check on the status.  Recommended Screenings:  Health Maintenance  Topic Date Due   DTaP/Tdap/Td vaccine (3 - Tdap) 12/03/2018   Medicare Annual Wellness Visit  11/28/2023   COVID-19 Vaccine (8 - 2025-26 season) 01/22/2024   Colon Cancer Screening  03/26/2025   Pneumococcal Vaccine for age over 40  Completed   Flu Shot  Completed   Hepatitis C Screening  Completed   Zoster (Shingles) Vaccine  Completed   Meningitis B Vaccine  Aged Out       11/29/2023   11:32 AM  Advanced Directives  Does Patient Have a Medical Advance Directive? Yes  Type of Estate Agent of [place];Living will  Copy of Healthcare Power of Attorney in Chart? No - copy requested    Vision: Annual vision screenings are recommended for early detection of glaucoma, cataracts, and diabetic retinopathy. These exams can also reveal signs of chronic conditions such as diabetes and high blood pressure.  Dental: Annual dental screenings help detect early signs of oral cancer, gum disease, and other conditions linked to overall health, including heart disease and diabetes.

## 2023-11-29 NOTE — Progress Notes (Signed)
 Subjective:   Darren [name] is a 78 y.o. male who presents for a Medicare Annual Wellness Visit.  I connected with  Darren [name] on 11/29/23 by a audio enabled telemedicine application and verified that I am speaking with the correct person using two identifiers.  Patient Location: Home  Provider Location: Office/Clinic  I discussed the limitations of evaluation and management by telemedicine. The patient expressed understanding and agreed to proceed.  Vital Signs: Because this visit was a virtual/telehealth visit, some criteria may be missing or patient reported. Any vitals not documented were not able to be obtained and vitals that have been documented are patient reported.   Allergies (verified) Hydrocodone and Penicillins   History: Past Medical History:  Diagnosis Date   Basal cell carcinoma    face   Colon polyp 2023   GERD (gastroesophageal reflux disease)    History of colon polyps    Hypertrophy of prostate without urinary obstruction and other lower urinary tract symptoms (LUTS)    Leukocytopenia, unspecified    Other and unspecified hyperlipidemia    no meds   Overactive bladder    Past Surgical History:  Procedure Laterality Date   COLONOSCOPY     multiple - polyps   PROSTATE SURGERY  08/2012   TURP  at [facility]   ROTATOR CUFF REPAIR Right 2003   UPPER GI ENDOSCOPY     WISDOM TOOTH EXTRACTION     Family History  Problem Relation Age of Onset   Heart attack Father    Breast cancer Mother    Thyroid  cancer Maternal Grandmother    Colon cancer Neg Hx    Esophageal cancer Neg Hx    Rectal cancer Neg Hx    Stomach cancer Neg Hx    Social History   Occupational History   Occupation: retired college professor  Tobacco Use   Smoking status: Former    Current packs/day: 0.00    Average packs/day: 1 pack/day for 5.0 years (5.0 ttl pk-yrs)    Types: Cigarettes    Start date: 01/24/1963    Quit date: 01/24/1968    Years since quitting: 55.8    Smokeless tobacco: Never  Vaping Use   Vaping status: Never Used  Substance and Sexual Activity   Alcohol use: Yes    Alcohol/week: 14.0 - 21.0 standard drinks of alcohol    Types: 14 - 21 Glasses of wine per week    Comment: 2-3 glasses wine/day   Drug use: No   Sexual activity: Yes   Tobacco Counseling Counseling given: Not Answered  SDOH Screenings   Food Insecurity: No Food Insecurity (11/29/2023)  Housing: Unknown (11/29/2023)  Transportation Needs: No Transportation Needs (11/29/2023)  Utilities: Not At Risk (11/29/2023)  Alcohol Screen: Low Risk  (11/28/2022)  Depression (PHQ2-9): Low Risk  (11/29/2023)  Financial Resource Strain: Low Risk  (11/28/2022)  Physical Activity: Sufficiently Active (11/29/2023)  Social Connections: Moderately Integrated (11/29/2023)  Stress: No Stress Concern Present (11/29/2023)  Tobacco Use: Medium Risk (11/29/2023)  Health Literacy: Adequate Health Literacy (11/29/2023)   Depression Screen    11/29/2023   11:43 AM 11/28/2022   10:48 AM 11/06/2022   10:34 AM 04/10/2022   11:02 AM 11/10/2021    1:16 PM 11/10/2021    1:09 PM 11/01/2021    9:53 AM  PHQ 2/9 Scores  PHQ - 2 Score 0 0 0 0 0 0 0  PHQ- 9 Score   0  0  0  0  0  Data saved with a previous flowsheet row definition     Goals Addressed             This Visit's Progress    I want to maintain your health and stay active       COMPLETED: Patient Stated   On track    I want to maintain your health and stay active       Visit info / Clinical Intake: Medicare Wellness Visit Type:: Subsequent Annual Wellness Visit Medicare Wellness Visit Mode:: Telephone If telephone:: video declined If telephone or video:: unable to obtan vitals due to lack of equipment Interpreter Needed?: No Pre-visit prep was completed: yes AWV questionnaire completed by patient prior to visit?: no Living arrangements:: lives with spouse/significant other Patient's Overall Health Status Rating:  excellent Typical amount of pain: none Does pain affect daily life?: no Are you currently prescribed opioids?: no  Dietary Habits and Nutritional Risks How many meals a day?: 3 Eats fruit and vegetables daily?: yes Most meals are obtained by: preparing own meals (eat out 2 meals a week) In the last 2 weeks, have you had any of the following?: -- (none) Diabetic:: no  Functional Status Activities of Daily Living (to include ambulation/medication): Independent Ambulation: Independent Medication Administration: Independent Home Management: Independent Manage your own finances?: yes Primary transportation is: driving Concerns about vision?: no *vision screening is required for WTM* Concerns about hearing?: no  Fall Screening Falls in the past year?: 0 Number of falls in past year: 0 Was there an injury with Fall?: 0 Fall Risk Category Calculator: 0 Patient Fall Risk Level: Low Fall Risk  Fall Risk Patient at Risk for Falls Due to: No Fall Risks Fall risk Follow up: Education provided; Falls prevention discussed  Home and Transportation Safety: All rugs have non-skid backing?: (!) no All stairs or steps have railings?: yes Grab bars in the bathtub or shower?: yes Have non-skid surface in bathtub or shower?: yes Good home lighting?: yes Regular seat belt use?: yes Hospital stays in the last year:: no  Cognitive Assessment Difficulty concentrating, remembering, or making decisions? : no Will 6CIT or Mini Cog be Completed: yes What year is it?: 0 points What month is it?: 0 points Give patient an address phrase to remember (5 components): [address] [place]  About what time is it?: 0 points Count backwards from 20 to 1: 0 points Say the months of the year in reverse: 0 points Repeat the address phrase from earlier: 0 points 6 CIT Score: 0 points  Advance Directives (For Healthcare) Does Patient Have a Medical Advance Directive?: Yes Type of Advance  Directive: Healthcare Power of [place]; Living will Copy of Healthcare Power of Attorney in Chart?: No - copy requested Copy of Living Will in Chart?: No - copy requested        Objective:    Today's Vitals   11/29/23 1129  Weight: 208 lb (94.3 kg)  Height: 6' (1.829 m)   Body mass index is 28.21 kg/m.  Current Medications (verified) Outpatient Encounter Medications as of 11/29/2023  Medication Sig   famotidine (PEPCID) 20 MG tablet Take 40 mg by mouth at bedtime.    GEMTESA 75 MG TABS Take 1 tablet by mouth daily.   Multiple Vitamins-Minerals (CENTRUM SILVER 50+MEN PO) Take 1 Capful by mouth daily.   omeprazole (PRILOSEC OTC) 20 MG tablet Take 20 mg by mouth every morning.    tadalafil (CIALIS) 5 MG tablet Take 5 mg by mouth daily  as needed for erectile dysfunction.   ALPRAZolam  (XANAX ) 0.5 MG tablet Take 1 by mouth up to twice daily for airplane flight (Patient not taking: Reported on 11/29/2023)   No facility-administered encounter medications on file as of 11/29/2023.   Hearing/Vision screen Vision Screening - Comments:: UTD w/visits with [facility] Immunizations and Health Maintenance Health Maintenance  Topic Date Due   DTaP/Tdap/Td (3 - Tdap) 12/03/2018   Medicare Annual Wellness (AWV)  11/28/2023   COVID-19 Vaccine (8 - 2025-26 season) 01/22/2024   Colonoscopy  03/26/2025   Pneumococcal Vaccine: 50+ Years  Completed   Influenza Vaccine  Completed   Hepatitis C Screening  Completed   Zoster Vaccines- Shingrix  Completed   Meningococcal B Vaccine  Aged Out        Assessment/Plan:  This is a routine wellness examination for Darren [name].  Patient Care Team: Tower, Laine LABOR, MD as PCP - [facility], [facility] (Ophthalmology)  I have personally reviewed and noted the following in the patient's chart:   Medical and social history Use of alcohol, tobacco or illicit drugs  Current medications and supplements including opioid prescriptions. Functional  ability and status Nutritional status Physical activity Advanced directives List of other physicians Hospitalizations, surgeries, and ER visits in previous 12 months Vitals Screenings to include cognitive, depression, and falls Referrals and appointments  No orders of the defined types were placed in this encounter.  In addition, I have reviewed and discussed with patient certain preventive protocols, quality metrics, and best practice recommendations. A written personalized care plan for preventive services as well as general preventive health recommendations were provided to patient.   Darren LITTIE Saris, LPN   [id number]/03/[id number]   No follow-ups on file.  After Visit Summary: (MyChart) Due to this being a telephonic visit, the after visit summary with patients personalized plan was offered to patient via MyChart   Nurse Notes: Pt has no concerns or questions. AWV made for one year

## 2023-12-04 ENCOUNTER — Encounter: Payer: Self-pay | Admitting: Family Medicine

## 2023-12-04 ENCOUNTER — Ambulatory Visit: Payer: Self-pay | Admitting: Family Medicine

## 2023-12-04 ENCOUNTER — Ambulatory Visit: Admitting: Family Medicine

## 2023-12-04 VITALS — BP 142/80 | HR 67 | Temp 97.7°F | Ht 71.0 in | Wt 213.0 lb

## 2023-12-04 DIAGNOSIS — E7800 Pure hypercholesterolemia, unspecified: Secondary | ICD-10-CM

## 2023-12-04 DIAGNOSIS — G8929 Other chronic pain: Secondary | ICD-10-CM

## 2023-12-04 DIAGNOSIS — Z860100 Personal history of colon polyps, unspecified: Secondary | ICD-10-CM | POA: Diagnosis not present

## 2023-12-04 DIAGNOSIS — K219 Gastro-esophageal reflux disease without esophagitis: Secondary | ICD-10-CM

## 2023-12-04 DIAGNOSIS — N400 Enlarged prostate without lower urinary tract symptoms: Secondary | ICD-10-CM | POA: Diagnosis not present

## 2023-12-04 DIAGNOSIS — R0981 Nasal congestion: Secondary | ICD-10-CM

## 2023-12-04 DIAGNOSIS — M25512 Pain in left shoulder: Secondary | ICD-10-CM | POA: Diagnosis not present

## 2023-12-04 DIAGNOSIS — R7309 Other abnormal glucose: Secondary | ICD-10-CM | POA: Diagnosis not present

## 2023-12-04 DIAGNOSIS — Z0000 Encounter for general adult medical examination without abnormal findings: Secondary | ICD-10-CM

## 2023-12-04 DIAGNOSIS — R030 Elevated blood-pressure reading, without diagnosis of hypertension: Secondary | ICD-10-CM | POA: Insufficient documentation

## 2023-12-04 DIAGNOSIS — Z79899 Other long term (current) drug therapy: Secondary | ICD-10-CM

## 2023-12-04 DIAGNOSIS — R195 Other fecal abnormalities: Secondary | ICD-10-CM | POA: Diagnosis not present

## 2023-12-04 DIAGNOSIS — I10 Essential (primary) hypertension: Secondary | ICD-10-CM | POA: Insufficient documentation

## 2023-12-04 DIAGNOSIS — Z1211 Encounter for screening for malignant neoplasm of colon: Secondary | ICD-10-CM

## 2023-12-04 LAB — CBC WITH DIFFERENTIAL/PLATELET
Basophils Absolute: 0 K/uL (ref 0.0–0.1)
Basophils Relative: 0.8 % (ref 0.0–3.0)
Eosinophils Absolute: 0.1 K/uL (ref 0.0–0.7)
Eosinophils Relative: 1.6 % (ref 0.0–5.0)
HCT: 41.2 % (ref 39.0–52.0)
Hemoglobin: 14.3 g/dL (ref 13.0–17.0)
Lymphocytes Relative: 30.1 % (ref 12.0–46.0)
Lymphs Abs: 1.4 K/uL (ref 0.7–4.0)
MCHC: 34.7 g/dL (ref 30.0–36.0)
MCV: 98.1 fl (ref 78.0–100.0)
Monocytes Absolute: 0.6 K/uL (ref 0.1–1.0)
Monocytes Relative: 14 % — ABNORMAL HIGH (ref 3.0–12.0)
Neutro Abs: 2.4 K/uL (ref 1.4–7.7)
Neutrophils Relative %: 53.5 % (ref 43.0–77.0)
Platelets: 182 K/uL (ref 150.0–400.0)
RBC: 4.2 Mil/uL — ABNORMAL LOW (ref 4.22–5.81)
RDW: 12.3 % (ref 11.5–15.5)
WBC: 4.6 K/uL (ref 4.0–10.5)

## 2023-12-04 NOTE — Assessment & Plan Note (Signed)
 Continues urology care Reviewed last note from April TURP in past  Lab Results  Component Value Date   PSA 0.72 11/27/2023   PSA 1.24 10/30/2022   PSA 0.55 10/28/2021    Taking gemtesa

## 2023-12-04 NOTE — Assessment & Plan Note (Signed)
 Recently  No abd pain or other symptoms  Checking cbc today

## 2023-12-04 NOTE — Assessment & Plan Note (Signed)
 Prediabetes  Lab Results  Component Value Date   HGBA1C 5.5 11/27/2023   HGBA1C 5.4 10/30/2022   HGBA1C 5.6 10/28/2021    disc imp of low glycemic diet and wt loss to prevent DM2

## 2023-12-04 NOTE — Progress Notes (Signed)
 The patient was the participant in this visit

## 2023-12-04 NOTE — Assessment & Plan Note (Signed)
Colonoscopy 03/2022 (after positive cologuard) noted polyps Has 3 y recall if healthy enough

## 2023-12-04 NOTE — Assessment & Plan Note (Signed)
Colonoscopy 03/2022  Recall for 3 y if no contraindications

## 2023-12-04 NOTE — Assessment & Plan Note (Signed)
 Disc goals for lipids and reasons to control them Rev last labs with pt Rev low sat fat diet in detail  LDL 115 HDL did go up notably to 74.8

## 2023-12-04 NOTE — Assessment & Plan Note (Signed)
 BP: (!) 142/80  Encouraged to monitor at home Follow up 2 mo with monitor  Discussed lifestyle changes for HTN incl DASH eating plan

## 2023-12-04 NOTE — Assessment & Plan Note (Signed)
 Reviewed health habits including diet and exercise and skin cancer prevention Reviewed appropriate screening tests for age  Also reviewed health mt list, fam hx and immunization status , as well as social and family history   See HPI Labs reviewed and ordered Health Maintenance  Topic Date Due   DTaP/Tdap/Td vaccine (3 - Tdap) 12/03/2018   COVID-19 Vaccine (8 - 2025-26 season) 01/22/2024   Medicare Annual Wellness Visit  11/28/2024   Colon Cancer Screening  03/26/2025   Pneumococcal Vaccine for age over 80  Completed   Flu Shot  Completed   Hepatitis C Screening  Completed   Zoster (Shingles) Vaccine  Completed   Meningitis B Vaccine  Aged Out    Per pt-had Td at pharmacy  Utd flu and covid shots PSA ok/stable  Colonoscopy due 2027 if healthy  Discussed fall prevention, supplements and exercise for bone density  PHQ 0 Borderline blood pressure  Schedule follow up in 2 mo for re check

## 2023-12-04 NOTE — Assessment & Plan Note (Signed)
 Lab Results  Component Value Date   VITAMINB12 560 11/27/2023   Encouraged to continue supplementing B12 and D

## 2023-12-04 NOTE — Assessment & Plan Note (Signed)
 Controlled with  Omeprazole 20 mg daily in am  Pepcid 40 mg bid Avoiding triggers

## 2023-12-04 NOTE — Assessment & Plan Note (Signed)
 After lifting heavy object  Pain in deltoid area   Will schedule appointment with sport med

## 2023-12-04 NOTE — Progress Notes (Signed)
 Subjective:    Patient ID: Darren [name], male    DOB: [birth year], 78 y.o.   MRN: [id number]  HPI  Here for health maintenance exam and to review chronic medical problems   Wt Readings from Last 3 Encounters:  12/04/23 213 lb (96.6 kg)  11/29/23 208 lb (94.3 kg)  11/28/22 208 lb (94.3 kg)   29.71 kg/m  Vitals:   12/04/23 0901 12/04/23 0928  BP: (!) 150/90 (!) 142/80  Pulse: 67   Temp: 97.7 F (36.5 C)   SpO2: 99%     Immunization History  Administered Date(s) Administered   Fluad Quad(high Dose 65+) 10/01/2018, 10/14/2019, 11/12/2023   H1N1 02/06/2008   Hepatitis A 11/26/2003, 06/24/2004   INFLUENZA, HIGH DOSE SEASONAL PF 09/24/2018, 10/28/2021, 10/18/2022   Influenza Split 10/26/2010, 10/23/2011   Influenza Whole 12/15/2003, 11/16/2006, 10/27/2008, 10/14/2009   Influenza,inj,Quad PF,6+ Mos 10/09/2012, 10/29/2013, 10/13/2014, 10/29/2015, 11/10/2016, 10/05/2017   MODERNA COVID-19 SARS-COV-2 PEDS BIVALENT BOOSTER 69yr-74yr 05/04/2020, 09/29/2020   Moderna Covid-19 Vaccine Bivalent Booster 35yrs & up 06/24/2021   PFIZER(Purple Top)SARS-COV-2 Vaccination 02/27/2019, 03/24/2019   Pfizer Covid-19 Vaccine Bivalent Booster 32yrs & up 10/17/2021, 11/27/2023   Pneumococcal Conjugate-13 09/17/2015   Pneumococcal Polysaccharide-23 09/09/2007, 05/13/2013   Respiratory Syncytial Virus Vaccine,Recomb Aduvanted(Arexvy) 09/27/2021   Td 07/29/1998, 12/02/2008   Zoster Recombinant(Shingrix) 11/27/2017, 02/11/2018   Zoster, Live 09/28/2005    Health Maintenance Due  Topic Date Due   DTaP/Tdap/Td (3 - Tdap) 12/03/2018   Tetanus shot -thinks he got at pharmacy   Utd flu and covid shot     Prostate health-fairly stable  Lab Results  Component Value Date   PSA 0.72 11/27/2023   PSA 1.24 10/30/2022   PSA 0.55 10/28/2021  TURP in past  History of BPH [facility] urology Dr Renda - has appointment in the spring     Colon cancer screening  Colonoscopy 03/2022 after  positive cologuard, had polyps , planned 3 y follow up   Bone health   Falls-none  Fractures-none  Supplements - daily mvi centrum silver    Exercise  Goes to the gym  Some cardio  Some strength training   Did develop problem with shoulder after lifting something heavy  Had to stop lifting weights on that side   Dermatology  Seeing a new dermatologist in [place] - goes regularly   Is good about sun protection    Mood    11/29/2023   11:43 AM 11/28/2022   10:48 AM 11/06/2022   10:34 AM 04/10/2022   11:02 AM 11/10/2021    1:16 PM  Depression screen PHQ 2/9  Decreased Interest 0 0 0 0 0  Down, Depressed, Hopeless 0 0 0 0 0  PHQ - 2 Score 0 0 0 0 0  Altered sleeping   0 0 0  Tired, decreased energy   0 0 0  Change in appetite   0 0 0  Feeling bad or failure about yourself    0 0 0  Trouble concentrating   0 0 0  Moving slowly or fidgety/restless   0 0 0  Suicidal thoughts   0 0 0  PHQ-9 Score   0  0  0   Difficult doing work/chores   Not difficult at all Not difficult at all Not difficult at all     Data saved with a previous flowsheet row definition    GERD Takes pepcid 40 mg at bedtime  Omepraaole 20 mg daily in am  Still  works fairly well    Lab Results  Component Value Date   VITAMINB12 560 11/27/2023   Glucose Lab Results  Component Value Date   HGBA1C 5.5 11/27/2023   HGBA1C 5.4 10/30/2022   HGBA1C 5.6 10/28/2021    Hyperlipidemia Lab Results  Component Value Date   CHOL 202 (H) 11/27/2023   CHOL 183 10/30/2022   CHOL 190 10/28/2021   Lab Results  Component Value Date   HDL 74.80 11/27/2023   HDL 59.70 10/30/2022   HDL 58.70 10/28/2021   Lab Results  Component Value Date   LDLCALC 115 (H) 11/27/2023   LDLCALC 109 (H) 10/30/2022   LDLCALC 117 (H) 10/28/2021   Lab Results  Component Value Date   TRIG 60.0 11/27/2023   TRIG 72.0 10/30/2022   TRIG 72.0 10/28/2021   Lab Results  Component Value Date   CHOLHDL 3 11/27/2023    CHOLHDL 3 10/30/2022   CHOLHDL 3 10/28/2021   Lab Results  Component Value Date   LDLDIRECT 127.7 02/15/2009   LDLDIRECT 144.2 12/02/2008   LDLDIRECT 134.0 03/21/2007   Diet controlled  HDL is up   (eating more fish and olive oil)   Pretty good with diet   Stools are darker lately  More fiber  No abd pain at all     The 10-year ASCVD risk score (Arnett DK, et al., 2019) is: 31.2%   Values used to calculate the score:     Age: 49 years     Clincally relevant sex: Male     Is Non-Hispanic African American: No     Diabetic: No     Tobacco smoker: No     Systolic Blood Pressure: 142 mmHg     Is BP treated: No     HDL Cholesterol: 74.8 mg/dL     Total Cholesterol: 202 mg/dL   Saw a hand specialist with ortho, for tingling in finger tips  Gave him a glove to wear when he sleeps at night  Told him to take B6 also   Lab Results  Component Value Date   ALT 18 11/27/2023   AST 17 11/27/2023   ALKPHOS 37 (L) 11/27/2023   BILITOT 0.7 11/27/2023   Lab Results  Component Value Date   NA 138 11/27/2023   K 4.0 11/27/2023   CO2 26 11/27/2023   GLUCOSE 88 11/27/2023   BUN 16 11/27/2023   CREATININE 0.86 11/27/2023   CALCIUM 9.0 11/27/2023   GFR 82.78 11/27/2023   GFRNONAA 80.01 12/02/2008   Lab Results  Component Value Date   TSH 3.03 11/27/2023     Patient Active Problem List   Diagnosis Date Noted   Left shoulder pain 12/04/2023   Dark stools 12/04/2023   Elevated BP without diagnosis of hypertension 12/04/2023   Positive colorectal cancer screening using Cologuard test 11/21/2021   Colon cancer screening 11/01/2021   Current use of proton pump inhibitor 10/28/2020   Elevated glucose level 09/19/2016   Routine general medical examination at a health care facility 08/10/2014   History of colonic polyps 09/21/2009   Hyperlipidemia 12/02/2008   GERD 12/02/2008   ANXIETY DISORDER, SITUATIONAL, MILD 04/03/2008   BPH (benign prostatic hyperplasia) 10/22/2006    Past Medical History:  Diagnosis Date   Basal cell carcinoma    face   Colon polyp 2023   GERD (gastroesophageal reflux disease)    History of colon polyps    Hypertrophy of prostate without urinary obstruction and other lower urinary tract symptoms (  LUTS)    Leukocytopenia, unspecified    Other and unspecified hyperlipidemia    no meds   Overactive bladder    Past Surgical History:  Procedure Laterality Date   COLONOSCOPY     multiple - polyps   PROSTATE SURGERY  08/2012   TURP  at [facility]   ROTATOR CUFF REPAIR Right 2003   UPPER GI ENDOSCOPY     WISDOM TOOTH EXTRACTION     Social History   Tobacco Use   Smoking status: Former    Current packs/day: 0.00    Average packs/day: 1 pack/day for 5.0 years (5.0 ttl pk-yrs)    Types: Cigarettes    Start date: 01/24/1963    Quit date: 01/24/1968    Years since quitting: 55.8   Smokeless tobacco: Never  Vaping Use   Vaping status: Never Used  Substance Use Topics   Alcohol use: Yes    Alcohol/week: 14.0 - 21.0 standard drinks of alcohol    Types: 14 - 21 Glasses of wine per week    Comment: 2-3 glasses wine/day   Drug use: No   Family History  Problem Relation Age of Onset   Heart attack Father    Breast cancer Mother    Thyroid  cancer Maternal Grandmother    Colon cancer Neg Hx    Esophageal cancer Neg Hx    Rectal cancer Neg Hx    Stomach cancer Neg Hx    Allergies  Allergen Reactions   Hydrocodone Nausea Only    Hydrocodone caused terrible nausea.   Penicillins     REACTION: allergy as a child   Current Outpatient Medications on File Prior to Visit  Medication Sig Dispense Refill   ALPRAZolam  (XANAX ) 0.5 MG tablet Take 1 by mouth up to twice daily for airplane flight 15 tablet 0   famotidine (PEPCID) 20 MG tablet Take 40 mg by mouth at bedtime.      GEMTESA 75 MG TABS Take 1 tablet by mouth daily.     Multiple Vitamins-Minerals (CENTRUM SILVER 50+MEN PO) Take 1 Capful by mouth daily.     omeprazole (PRILOSEC  OTC) 20 MG tablet Take 20 mg by mouth every morning.      tadalafil (CIALIS) 5 MG tablet Take 5 mg by mouth daily as needed for erectile dysfunction.     No current facility-administered medications on file prior to visit.    Review of Systems  Constitutional:  Negative for activity change, appetite change, fatigue, fever and unexpected weight change.  HENT:  Negative for congestion, rhinorrhea, sore throat and trouble swallowing.   Eyes:  Negative for pain, redness, itching and visual disturbance.  Respiratory:  Negative for cough, chest tightness, shortness of breath and wheezing.   Cardiovascular:  Negative for chest pain and palpitations.  Gastrointestinal:  Negative for abdominal pain, blood in stool, constipation, diarrhea and nausea.       Dark stool  Endocrine: Negative for cold intolerance, heat intolerance, polydipsia and polyuria.  Genitourinary:  Negative for difficulty urinating, dysuria, frequency and urgency.  Musculoskeletal:  Positive for arthralgias. Negative for joint swelling and myalgias.       Shoulder pain   Skin:  Negative for pallor and rash.  Neurological:  Negative for dizziness, tremors, weakness, numbness and headaches.  Hematological:  Negative for adenopathy. Does not bruise/bleed easily.  Psychiatric/Behavioral:  Negative for decreased concentration and dysphoric mood. The patient is not nervous/anxious.        Objective:   Physical Exam Constitutional:  General: He is not in acute distress.    Appearance: Normal appearance. He is well-developed. He is not ill-appearing or diaphoretic.     Comments: Overweight   HENT:     Head: Normocephalic and atraumatic.     Right Ear: Tympanic membrane, ear canal and external ear normal.     Left Ear: Tympanic membrane, ear canal and external ear normal.     Nose: Nose normal. No congestion.     Mouth/Throat:     Mouth: Mucous membranes are moist.     Pharynx: Oropharynx is clear. No posterior  oropharyngeal erythema.  Eyes:     General: No scleral icterus.       Right eye: No discharge.        Left eye: No discharge.     Conjunctiva/sclera: Conjunctivae normal.     Pupils: Pupils are equal, round, and reactive to light.  Neck:     Thyroid : No thyromegaly.     Vascular: No carotid bruit or JVD.  Cardiovascular:     Rate and Rhythm: Normal rate and regular rhythm.     Pulses: Normal pulses.     Heart sounds: Normal heart sounds.     No gallop.  Pulmonary:     Effort: Pulmonary effort is normal. No respiratory distress.     Breath sounds: Normal breath sounds. No wheezing or rales.     Comments: Good air exch Chest:     Chest wall: No tenderness.  Abdominal:     General: Bowel sounds are normal. There is no distension or abdominal bruit.     Palpations: Abdomen is soft. There is no mass.     Tenderness: There is no abdominal tenderness.     Hernia: No hernia is present.  Musculoskeletal:        General: No tenderness.     Cervical back: Normal range of motion and neck supple. No rigidity. No muscular tenderness.     Right lower leg: No edema.     Left lower leg: No edema.  Lymphadenopathy:     Cervical: No cervical adenopathy.  Skin:    General: Skin is warm and dry.     Coloration: Skin is not pale.     Findings: No erythema or rash.     Comments: Solar lentigines diffusely Fair complexion   Neurological:     Mental Status: He is alert.     Cranial Nerves: No cranial nerve deficit.     Motor: No abnormal muscle tone.     Coordination: Coordination normal.     Gait: Gait normal.     Deep Tendon Reflexes: Reflexes are normal and symmetric. Reflexes normal.  Psychiatric:        Mood and Affect: Mood normal.        Cognition and Memory: Cognition and memory normal.           Assessment & Plan:   Problem List Items Addressed This Visit       Digestive   GERD   Controlled with  Omeprazole 20 mg daily in am  Pepcid 40 mg bid Avoiding triggers          Genitourinary   BPH (benign prostatic hyperplasia)   Continues urology care Reviewed last note from April TURP in past  Lab Results  Component Value Date   PSA 0.72 11/27/2023   PSA 1.24 10/30/2022   PSA 0.55 10/28/2021    Taking gemtesa         Other  Routine general medical examination at a health care facility - Primary   Reviewed health habits including diet and exercise and skin cancer prevention Reviewed appropriate screening tests for age  Also reviewed health mt list, fam hx and immunization status , as well as social and family history   See HPI Labs reviewed and ordered Health Maintenance  Topic Date Due   DTaP/Tdap/Td vaccine (3 - Tdap) 12/03/2018   COVID-19 Vaccine (8 - 2025-26 season) 01/22/2024   Medicare Annual Wellness Visit  11/28/2024   Colon Cancer Screening  03/26/2025   Pneumococcal Vaccine for age over 92  Completed   Flu Shot  Completed   Hepatitis C Screening  Completed   Zoster (Shingles) Vaccine  Completed   Meningitis B Vaccine  Aged Out    Per pt-had Td at pharmacy  Utd flu and covid shots PSA ok/stable  Colonoscopy due 2027 if healthy  Discussed fall prevention, supplements and exercise for bone density  PHQ 0 Borderline blood pressure  Schedule follow up in 2 mo for re check       RESOLVED: Nasal congestion   Left shoulder pain   After lifting heavy object  Pain in deltoid area   Will schedule appointment with sport med       Hyperlipidemia   Disc goals for lipids and reasons to control them Rev last labs with pt Rev low sat fat diet in detail  LDL 115 HDL did go up notably to 74.8       History of colonic polyps   Colonoscopy 03/2022  Recall for 3 y if no contraindications       Elevated glucose level   Prediabetes  Lab Results  Component Value Date   HGBA1C 5.5 11/27/2023   HGBA1C 5.4 10/30/2022   HGBA1C 5.6 10/28/2021    disc imp of low glycemic diet and wt loss to prevent DM2        Elevated BP  without diagnosis of hypertension   BP: (!) 142/80  Encouraged to monitor at home Follow up 2 mo with monitor  Discussed lifestyle changes for HTN incl DASH eating plan       Dark stools   Recently  No abd pain or other symptoms  Checking cbc today        Relevant Orders   CBC with Differential/Platelet (Completed)   Current use of proton pump inhibitor   Lab Results  Component Value Date   VITAMINB12 560 11/27/2023   Encouraged to continue supplementing B12 and D      Colon cancer screening   Colonoscopy 03/2022 (after positive cologuard) noted polyps Has 3 y recall if healthy enough

## 2023-12-04 NOTE — Patient Instructions (Addendum)
 Keep up the good exercise habits   Make sure your vitamin gives you at least 2000 international units daily of vitamin D   Blood pressure is up a bit today   Check at home if you can (see handout)   Follow up in 2 months- to check it again (bring your cuff to the visit and we will check it)   Labs look stable   Let's check cbc today in light of dark stools

## 2023-12-10 ENCOUNTER — Ambulatory Visit: Admitting: Family Medicine

## 2023-12-10 ENCOUNTER — Encounter: Payer: Self-pay | Admitting: Family Medicine

## 2023-12-10 VITALS — BP 143/80 | HR 61 | Temp 97.6°F | Ht 71.0 in | Wt 215.1 lb

## 2023-12-10 DIAGNOSIS — I10 Essential (primary) hypertension: Secondary | ICD-10-CM | POA: Diagnosis not present

## 2023-12-10 MED ORDER — VALSARTAN 40 MG PO TABS: 40.0000 mg | ORAL_TABLET | Freq: Every day | ORAL | 0 refills | Status: DC

## 2023-12-10 NOTE — Progress Notes (Signed)
 Subjective:    Patient ID: Darren [name], male    DOB: 1945/10/09, 78 y.o.   MRN: [id number]  HPI  Wt Readings from Last 3 Encounters:  12/10/23 215 lb 2 oz (97.6 kg)  12/04/23 213 lb (96.6 kg)  11/29/23 208 lb (94.3 kg)   30.00 kg/m  Vitals:   12/10/23 0854 12/10/23 0912  BP: (!) 150/78 (!) 143/80  Pulse: 61   Temp: 97.6 F (36.4 C)   SpO2: 100%      Pt presents for follow up of elevated blood pressure    Last visit blood pressure was elevated- at 142/80 BP: (!) 143/80  Went home and checked it , systolic was staying in 140s  Diastolic low 80s   bp is stable today  No cp or palpitations or headaches or edema  No side effects to medicines  BP Readings from Last 3 Encounters:  12/10/23 (!) 143/80  12/04/23 (!) 142/80  11/06/22 124/68     Lab Results  Component Value Date   NA 138 11/27/2023   K 4.0 11/27/2023   CO2 26 11/27/2023   GLUCOSE 88 11/27/2023   BUN 16 11/27/2023   CREATININE 0.86 11/27/2023   CALCIUM 9.0 11/27/2023   GFR 82.78 11/27/2023   GFRNONAA 80.01 12/02/2008     Alcohol- 2 or more drinks per day - beer/wine mostly / occational whisky  Caffeine - a lot   4 cups coffee  Water intake -    - at least 5  eight ounce glasses per day  Diet  Does not cook with salt  Does not salt anything   Not a snacker   Exercise - goes to the gym daily    Lab Results  Component Value Date   WBC 4.6 12/04/2023   HGB 14.3 12/04/2023   HCT 41.2 12/04/2023   MCV 98.1 12/04/2023   PLT 182.0 12/04/2023   Lab Results  Component Value Date   TSH 3.03 11/27/2023   Lab Results  Component Value Date   ALT 18 11/27/2023   AST 17 11/27/2023   ALKPHOS 37 (L) 11/27/2023   BILITOT 0.7 11/27/2023   Lab Results  Component Value Date   CHOL 202 (H) 11/27/2023   HDL 74.80 11/27/2023   LDLCALC 115 (H) 11/27/2023   LDLDIRECT 127.7 02/15/2009   TRIG 60.0 11/27/2023   CHOLHDL 3 11/27/2023      Patient Active Problem List   Diagnosis Date  Noted   Left shoulder pain 12/04/2023   Dark stools 12/04/2023   Essential hypertension 12/04/2023   Positive colorectal cancer screening using Cologuard test 11/21/2021   Colon cancer screening 11/01/2021   Current use of proton pump inhibitor 10/28/2020   Elevated glucose level 09/19/2016   Routine general medical examination at a health care facility 08/10/2014   History of colonic polyps 09/21/2009   Hyperlipidemia 12/02/2008   GERD 12/02/2008   ANXIETY DISORDER, SITUATIONAL, MILD 04/03/2008   BPH (benign prostatic hyperplasia) 10/22/2006   Past Medical History:  Diagnosis Date   Basal cell carcinoma    face   Colon polyp 2023   GERD (gastroesophageal reflux disease)    History of colon polyps    Hypertrophy of prostate without urinary obstruction and other lower urinary tract symptoms (LUTS)    Leukocytopenia, unspecified    Other and unspecified hyperlipidemia    no meds   Overactive bladder    Past Surgical History:  Procedure Laterality Date   COLONOSCOPY  multiple - polyps   PROSTATE SURGERY  08/2012   TURP  at [facility]   ROTATOR CUFF REPAIR Right 2003   UPPER GI ENDOSCOPY     WISDOM TOOTH EXTRACTION     Social History   Tobacco Use   Smoking status: Former    Current packs/day: 0.00    Average packs/day: 1 pack/day for 5.0 years (5.0 ttl pk-yrs)    Types: Cigarettes    Start date: 01/24/1963    Quit date: 01/24/1968    Years since quitting: 55.9   Smokeless tobacco: Never  Vaping Use   Vaping status: Never Used  Substance Use Topics   Alcohol use: Yes    Alcohol/week: 14.0 - 21.0 standard drinks of alcohol    Types: 14 - 21 Glasses of wine per week    Comment: 2-3 glasses wine/day   Drug use: No   Family History  Problem Relation Age of Onset   Heart attack Father    Breast cancer Mother    Thyroid  cancer Maternal Grandmother    Colon cancer Neg Hx    Esophageal cancer Neg Hx    Rectal cancer Neg Hx    Stomach cancer Neg Hx    Allergies   Allergen Reactions   Hydrocodone Nausea Only    Hydrocodone caused terrible nausea.   Penicillins     REACTION: allergy as a child   Current Outpatient Medications on File Prior to Visit  Medication Sig Dispense Refill   ALPRAZolam  (XANAX ) 0.5 MG tablet Take 1 by mouth up to twice daily for airplane flight 15 tablet 0   famotidine (PEPCID) 20 MG tablet Take 40 mg by mouth at bedtime.      GEMTESA 75 MG TABS Take 1 tablet by mouth daily.     Multiple Vitamins-Minerals (CENTRUM SILVER 50+MEN PO) Take 1 Capful by mouth daily.     omeprazole (PRILOSEC OTC) 20 MG tablet Take 20 mg by mouth every morning.      tadalafil (CIALIS) 5 MG tablet Take 5 mg by mouth daily as needed for erectile dysfunction.     No current facility-administered medications on file prior to visit.    Review of Systems  Constitutional:  Negative for activity change, appetite change, fatigue, fever and unexpected weight change.  HENT:  Negative for congestion, rhinorrhea, sore throat and trouble swallowing.   Eyes:  Negative for pain, redness, itching and visual disturbance.  Respiratory:  Negative for cough, chest tightness, shortness of breath and wheezing.   Cardiovascular:  Negative for chest pain and palpitations.  Gastrointestinal:  Negative for abdominal pain, blood in stool, constipation, diarrhea and nausea.  Endocrine: Negative for cold intolerance, heat intolerance, polydipsia and polyuria.  Genitourinary:  Negative for difficulty urinating, dysuria, frequency and urgency.  Musculoskeletal:  Negative for arthralgias, joint swelling and myalgias.  Skin:  Negative for pallor and rash.  Neurological:  Negative for dizziness, tremors, weakness, numbness and headaches.  Hematological:  Negative for adenopathy. Does not bruise/bleed easily.  Psychiatric/Behavioral:  Negative for decreased concentration and dysphoric mood. The patient is not nervous/anxious.        Objective:   Physical Exam Constitutional:       General: He is not in acute distress.    Appearance: Normal appearance. He is well-developed. He is obese. He is not ill-appearing or diaphoretic.  HENT:     Head: Normocephalic and atraumatic.  Eyes:     Conjunctiva/sclera: Conjunctivae normal.     Pupils: Pupils are equal, round,  and reactive to light.  Neck:     Thyroid : No thyromegaly.     Vascular: No carotid bruit or JVD.  Cardiovascular:     Rate and Rhythm: Normal rate and regular rhythm.     Heart sounds: Normal heart sounds.     No gallop.  Pulmonary:     Effort: Pulmonary effort is normal. No respiratory distress.     Breath sounds: Normal breath sounds. No wheezing or rales.  Abdominal:     General: There is no distension or abdominal bruit.     Palpations: Abdomen is soft.  Musculoskeletal:     Cervical back: Normal range of motion and neck supple.     Right lower leg: No edema.     Left lower leg: No edema.  Lymphadenopathy:     Cervical: No cervical adenopathy.  Skin:    General: Skin is warm and dry.     Coloration: Skin is not pale.     Findings: No rash.  Neurological:     Mental Status: He is alert.     Coordination: Coordination normal.     Deep Tendon Reflexes: Reflexes are normal and symmetric. Reflexes normal.  Psychiatric:        Mood and Affect: Mood normal.           Assessment & Plan:   Problem List Items Addressed This Visit       Cardiovascular and Mediastinum   Essential hypertension - Primary   BP: (!) 143/80   Discussed lifestyle change Encouraged to cut etoh and caffeine intake  Has good exercise/water intake  Encouraged less added sodium/ does well with this   Discussed options for treatment  Will try valsartan 40 mg (low dose) to start  Follow up in about 2 wk for visit and likely labs  Instructed to call if any side effects prior to that    Will continue to check blood pressure at home  Handouts given        Relevant Medications   valsartan (DIOVAN) 40 MG  tablet

## 2023-12-10 NOTE — Assessment & Plan Note (Addendum)
 BP: (!) 143/80   Discussed lifestyle change Encouraged to cut etoh and caffeine intake  Has good exercise/water intake  Encouraged less added sodium/ does well with this   Discussed options for treatment  Will try valsartan 40 mg (low dose) to start  Follow up in about 2 wk for visit and likely labs  Instructed to call if any side effects prior to that    Will continue to check blood pressure at home  Handouts given

## 2023-12-10 NOTE — Patient Instructions (Addendum)
 Please cut alcohol to 2 or less drinks per day Try and gradually decrease caffeine    Keep up water intake   Avoid processed /salty foods   Keep exercising   Start valsartan 40 mg daily  Any side effects, please call   Follow up in about 2 weeks

## 2023-12-25 ENCOUNTER — Ambulatory Visit: Payer: Self-pay | Admitting: Family Medicine

## 2023-12-25 ENCOUNTER — Ambulatory Visit: Admitting: Family Medicine

## 2023-12-25 ENCOUNTER — Encounter: Payer: Self-pay | Admitting: Family Medicine

## 2023-12-25 VITALS — BP 121/68 | HR 68 | Temp 97.9°F | Ht 71.0 in | Wt 215.2 lb

## 2023-12-25 DIAGNOSIS — I10 Essential (primary) hypertension: Secondary | ICD-10-CM

## 2023-12-25 LAB — BASIC METABOLIC PANEL WITH GFR
BUN: 18 mg/dL (ref 6–23)
CO2: 28 meq/L (ref 19–32)
Calcium: 9.3 mg/dL (ref 8.4–10.5)
Chloride: 103 meq/L (ref 96–112)
Creatinine, Ser: 0.84 mg/dL (ref 0.40–1.50)
GFR: 83.32 mL/min (ref >60.00)
Glucose, Bld: 97 mg/dL (ref 70–99)
Potassium: 4.3 meq/L (ref 3.5–5.1)
Sodium: 137 meq/L (ref 135–145)

## 2023-12-25 MED ORDER — VALSARTAN 40 MG PO TABS: 40.0000 mg | ORAL_TABLET | Freq: Every day | ORAL | 2 refills | Status: AC

## 2023-12-25 NOTE — Patient Instructions (Addendum)
 Labs today  Blood pressure is much better  Stay on the valsartan    Take care of yourself    Stay active and eat healthy

## 2023-12-25 NOTE — Progress Notes (Signed)
 Subjective:    Patient ID: Darren [name], male    DOB: May 22, 1945, 78 y.o.   MRN: [id number]  HPI  Wt Readings from Last 3 Encounters:  12/25/23 215 lb 4 oz (97.6 kg)  12/10/23 215 lb 2 oz (97.6 kg)  12/04/23 213 lb (96.6 kg)   30.02 kg/m  Vitals:   12/25/23 0918 12/25/23 0930  BP: 128/74 121/68  Pulse: 68   Temp: 97.9 F (36.6 C)   SpO2: 97%     Pt presents for follow up of  HTN   Last visit discussed lifestyle change including reducing caffeine and etoh intake   Prescription valsartan  40 mg daily  Tolerates well  No side effects at all     bp is improved today  No cp or palpitations or headaches or edema  No side effects to medicines  BP Readings from Last 3 Encounters:  12/25/23 121/68  12/10/23 (!) 143/80  12/04/23 (!) 142/80     Lab Results  Component Value Date   NA 138 11/27/2023   K 4.0 11/27/2023   CO2 26 11/27/2023   GLUCOSE 88 11/27/2023   BUN 16 11/27/2023   CREATININE 0.86 11/27/2023   CALCIUM 9.0 11/27/2023   GFR 82.78 11/27/2023   GFRNONAA 80.01 12/02/2008       Patient Active Problem List   Diagnosis Date Noted   Left shoulder pain 12/04/2023   Dark stools 12/04/2023   Essential hypertension 12/04/2023   Positive colorectal cancer screening using Cologuard test 11/21/2021   Colon cancer screening 11/01/2021   Current use of proton pump inhibitor 10/28/2020   Elevated glucose level 09/19/2016   Routine general medical examination at a health care facility 08/10/2014   History of colonic polyps 09/21/2009   Hyperlipidemia 12/02/2008   GERD 12/02/2008   ANXIETY DISORDER, SITUATIONAL, MILD 04/03/2008   BPH (benign prostatic hyperplasia) 10/22/2006   Past Medical History:  Diagnosis Date   Basal cell carcinoma    face   Colon polyp 2023   GERD (gastroesophageal reflux disease)    History of colon polyps    Hypertrophy of prostate without urinary obstruction and other lower urinary tract symptoms (LUTS)     Leukocytopenia, unspecified    Other and unspecified hyperlipidemia    no meds   Overactive bladder    Past Surgical History:  Procedure Laterality Date   COLONOSCOPY     multiple - polyps   PROSTATE SURGERY  08/2012   TURP  at [facility]   ROTATOR CUFF REPAIR Right 2003   UPPER GI ENDOSCOPY     WISDOM TOOTH EXTRACTION     Social History   Tobacco Use   Smoking status: Former    Current packs/day: 0.00    Average packs/day: 1 pack/day for 5.0 years (5.0 ttl pk-yrs)    Types: Cigarettes    Start date: 01/24/1963    Quit date: 01/24/1968    Years since quitting: 55.9   Smokeless tobacco: Never  Vaping Use   Vaping status: Never Used  Substance Use Topics   Alcohol use: Yes    Alcohol/week: 14.0 - 21.0 standard drinks of alcohol    Types: 14 - 21 Glasses of wine per week    Comment: 2-3 glasses wine/day   Drug use: No   Family History  Problem Relation Age of Onset   Heart attack Father    Breast cancer Mother    Thyroid  cancer Maternal Grandmother    Colon cancer Neg Hx  Esophageal cancer Neg Hx    Rectal cancer Neg Hx    Stomach cancer Neg Hx    Allergies  Allergen Reactions   Hydrocodone Nausea Only    Hydrocodone caused terrible nausea.   Penicillins     REACTION: allergy as a child   Current Outpatient Medications on File Prior to Visit  Medication Sig Dispense Refill   ALPRAZolam  (XANAX ) 0.5 MG tablet Take 1 by mouth up to twice daily for airplane flight 15 tablet 0   famotidine (PEPCID) 20 MG tablet Take 40 mg by mouth at bedtime.      GEMTESA 75 MG TABS Take 1 tablet by mouth daily.     Multiple Vitamins-Minerals (CENTRUM SILVER 50+MEN PO) Take 1 Capful by mouth daily.     omeprazole (PRILOSEC OTC) 20 MG tablet Take 20 mg by mouth every morning.      tadalafil (CIALIS) 5 MG tablet Take 5 mg by mouth daily as needed for erectile dysfunction.     valsartan  (DIOVAN ) 40 MG tablet Take 1 tablet (40 mg total) by mouth daily. 90 tablet 0   No current  facility-administered medications on file prior to visit.    Review of Systems  Constitutional:  Negative for activity change, appetite change, fatigue, fever and unexpected weight change.  HENT:  Negative for congestion, rhinorrhea, sore throat and trouble swallowing.   Eyes:  Negative for pain, redness, itching and visual disturbance.  Respiratory:  Negative for cough, chest tightness, shortness of breath and wheezing.   Cardiovascular:  Negative for chest pain and palpitations.  Gastrointestinal:  Negative for abdominal pain, blood in stool, constipation, diarrhea and nausea.  Endocrine: Negative for cold intolerance, heat intolerance, polydipsia and polyuria.  Genitourinary:  Negative for difficulty urinating, dysuria, frequency and urgency.  Musculoskeletal:  Negative for arthralgias, joint swelling and myalgias.  Skin:  Negative for pallor and rash.  Neurological:  Negative for dizziness, tremors, weakness, numbness and headaches.  Hematological:  Negative for adenopathy. Does not bruise/bleed easily.  Psychiatric/Behavioral:  Negative for decreased concentration and dysphoric mood. The patient is not nervous/anxious.        Objective:   Physical Exam Constitutional:      General: He is not in acute distress.    Appearance: Normal appearance. He is well-developed. He is obese. He is not ill-appearing or diaphoretic.  HENT:     Head: Normocephalic and atraumatic.  Eyes:     Conjunctiva/sclera: Conjunctivae normal.     Pupils: Pupils are equal, round, and reactive to light.  Neck:     Thyroid : No thyromegaly.     Vascular: No carotid bruit or JVD.  Cardiovascular:     Rate and Rhythm: Normal rate and regular rhythm.     Heart sounds: Normal heart sounds.     No gallop.  Pulmonary:     Effort: Pulmonary effort is normal. No respiratory distress.     Breath sounds: Normal breath sounds. No wheezing or rales.  Abdominal:     General: There is no distension or abdominal  bruit.     Palpations: Abdomen is soft.  Musculoskeletal:     Cervical back: Normal range of motion and neck supple.     Right lower leg: No edema.     Left lower leg: No edema.  Lymphadenopathy:     Cervical: No cervical adenopathy.  Skin:    General: Skin is warm and dry.     Coloration: Skin is not pale.     Findings:  No rash.  Neurological:     Mental Status: He is alert.     Coordination: Coordination normal.     Deep Tendon Reflexes: Reflexes are normal and symmetric. Reflexes normal.  Psychiatric:        Mood and Affect: Mood normal.           Assessment & Plan:   Problem List Items Addressed This Visit       Cardiovascular and Mediastinum   Essential hypertension - Primary   bp in fair control at this time (improved)  BP Readings from Last 1 Encounters:  12/25/23 121/68   No changes needed-will continue the valsartan  40 mg daily   Most recent labs reviewed  Bmet today -if stable will continue this  Disc lifstyle change with low sodium diet and exercise         Relevant Orders   Basic Metabolic Panel

## 2023-12-25 NOTE — Assessment & Plan Note (Signed)
 bp in fair control at this time (improved)  BP Readings from Last 1 Encounters:  12/25/23 121/68   No changes needed-will continue the valsartan  40 mg daily   Most recent labs reviewed  Bmet today -if stable will continue this  Disc lifstyle change with low sodium diet and exercise

## 2024-11-27 ENCOUNTER — Other Ambulatory Visit

## 2024-12-04 ENCOUNTER — Encounter: Admitting: Family Medicine

## 2024-12-04 ENCOUNTER — Ambulatory Visit
